# Patient Record
Sex: Female | Born: 1950 | Race: White | Hispanic: No | Marital: Married | Smoking: Former smoker
Health system: Southern US, Community
[De-identification: ages and names within clinical notes are randomized; demographics above are authoritative.]

## PROBLEM LIST (undated history)

## (undated) DIAGNOSIS — H269 Unspecified cataract: Secondary | ICD-10-CM

## (undated) DIAGNOSIS — I10 Essential (primary) hypertension: Secondary | ICD-10-CM

## (undated) DIAGNOSIS — G4730 Sleep apnea, unspecified: Secondary | ICD-10-CM

## (undated) DIAGNOSIS — K449 Diaphragmatic hernia without obstruction or gangrene: Secondary | ICD-10-CM

## (undated) DIAGNOSIS — E785 Hyperlipidemia, unspecified: Secondary | ICD-10-CM

## (undated) DIAGNOSIS — F40240 Claustrophobia: Secondary | ICD-10-CM

## (undated) DIAGNOSIS — M47817 Spondylosis without myelopathy or radiculopathy, lumbosacral region: Secondary | ICD-10-CM

## (undated) DIAGNOSIS — K219 Gastro-esophageal reflux disease without esophagitis: Secondary | ICD-10-CM

## (undated) DIAGNOSIS — M069 Rheumatoid arthritis, unspecified: Secondary | ICD-10-CM

## (undated) DIAGNOSIS — M545 Low back pain, unspecified: Secondary | ICD-10-CM

## (undated) HISTORY — DX: Essential (primary) hypertension: I10

## (undated) HISTORY — DX: Gastro-esophageal reflux disease without esophagitis: K21.9

## (undated) HISTORY — DX: Spondylosis without myelopathy or radiculopathy, lumbosacral region: M47.817

## (undated) HISTORY — PX: EYE SURGERY: SHX253

## (undated) HISTORY — DX: Rheumatoid arthritis, unspecified: M06.9

## (undated) HISTORY — DX: Diaphragmatic hernia without obstruction or gangrene: K44.9

## (undated) HISTORY — DX: Unspecified cataract: H26.9

## (undated) HISTORY — DX: Low back pain, unspecified: M54.50

## (undated) HISTORY — DX: Claustrophobia: F40.240

## (undated) HISTORY — DX: Hyperlipidemia, unspecified: E78.5

## (undated) HISTORY — DX: Sleep apnea, unspecified: G47.30

---

## 1991-11-06 DIAGNOSIS — K5090 Crohn's disease, unspecified, without complications: Secondary | ICD-10-CM

## 1991-11-06 HISTORY — DX: Crohn's disease, unspecified, without complications: K50.90

## 1996-10-12 ENCOUNTER — Ambulatory Visit: Admit: 1996-10-12 | Disposition: A | Discharge status: Home / Self Care | Payer: Self-pay | Admitting: Internal Medicine

## 1998-08-22 ENCOUNTER — Ambulatory Visit: Admit: 1998-08-22 | Disposition: A | Discharge status: Home / Self Care | Payer: Self-pay | Admitting: Internal Medicine

## 1998-11-08 ENCOUNTER — Other Ambulatory Visit: Payer: Self-pay

## 1998-11-08 ENCOUNTER — Ambulatory Visit: Admit: 1998-11-08 | Disposition: A | Discharge status: Home / Self Care | Payer: Self-pay | Admitting: Gastroenterology

## 1998-12-05 ENCOUNTER — Ambulatory Visit: Admit: 1998-12-05 | Disposition: A | Discharge status: Home / Self Care | Payer: Self-pay | Admitting: Gastroenterology

## 1998-12-19 ENCOUNTER — Ambulatory Visit: Admit: 1998-12-19 | Disposition: A | Discharge status: Home / Self Care | Payer: Self-pay | Admitting: Gastroenterology

## 2001-01-28 ENCOUNTER — Ambulatory Visit: Admit: 2001-01-28 | Disposition: A | Discharge status: Home / Self Care | Payer: Self-pay | Admitting: Gastroenterology

## 2001-01-28 ENCOUNTER — Other Ambulatory Visit: Payer: Self-pay

## 2003-08-26 ENCOUNTER — Ambulatory Visit
Admission: RE | Admit: 2003-08-26 | Disposition: A | Discharge status: Home / Self Care | Payer: Self-pay | Source: Ambulatory Visit | Admitting: Gastroenterology

## 2004-10-05 ENCOUNTER — Ambulatory Visit
Admission: RE | Admit: 2004-10-05 | Disposition: A | Discharge status: Home / Self Care | Payer: Self-pay | Source: Ambulatory Visit | Admitting: Gastroenterology

## 2004-12-08 ENCOUNTER — Ambulatory Visit
Admission: AD | Admit: 2004-12-08 | Disposition: A | Discharge status: Home / Self Care | Payer: Self-pay | Source: Ambulatory Visit | Admitting: Gastroenterology

## 2005-04-07 ENCOUNTER — Inpatient Hospital Stay
Admission: EM | Admit: 2005-04-07 | Disposition: A | Discharge status: Home / Self Care | Payer: Self-pay | Source: Emergency Department | Admitting: Critical Care Medicine

## 2005-05-09 ENCOUNTER — Ambulatory Visit
Admission: RE | Admit: 2005-05-09 | Disposition: A | Discharge status: Home / Self Care | Payer: Self-pay | Source: Ambulatory Visit | Admitting: Internal Medicine

## 2005-07-18 ENCOUNTER — Emergency Department
Admission: EM | Admit: 2005-07-18 | Disposition: A | Discharge status: Home / Self Care | Payer: Self-pay | Source: Emergency Department | Admitting: Emergency Medicine

## 2006-08-18 ENCOUNTER — Ambulatory Visit
Admission: RE | Admit: 2006-08-18 | Disposition: A | Discharge status: Home / Self Care | Payer: Self-pay | Source: Ambulatory Visit | Admitting: Gastroenterology

## 2006-09-02 ENCOUNTER — Ambulatory Visit
Admission: RE | Admit: 2006-09-02 | Disposition: A | Discharge status: Home / Self Care | Payer: Self-pay | Source: Ambulatory Visit | Admitting: Gastroenterology

## 2006-09-24 ENCOUNTER — Ambulatory Visit
Admission: RE | Admit: 2006-09-24 | Disposition: A | Discharge status: Home / Self Care | Payer: Self-pay | Source: Ambulatory Visit | Admitting: Internal Medicine

## 2006-10-07 ENCOUNTER — Ambulatory Visit
Admission: RE | Admit: 2006-10-07 | Disposition: A | Discharge status: Home / Self Care | Payer: Self-pay | Source: Ambulatory Visit | Admitting: Obstetrics & Gynecology

## 2006-10-14 ENCOUNTER — Ambulatory Visit
Admission: RE | Admit: 2006-10-14 | Disposition: A | Discharge status: Home / Self Care | Payer: Self-pay | Source: Ambulatory Visit | Admitting: Gastroenterology

## 2006-11-05 DIAGNOSIS — C4490 Unspecified malignant neoplasm of skin, unspecified: Secondary | ICD-10-CM

## 2006-11-05 HISTORY — DX: Unspecified malignant neoplasm of skin, unspecified: C44.90

## 2006-12-11 ENCOUNTER — Ambulatory Visit
Admission: RE | Admit: 2006-12-11 | Disposition: A | Discharge status: Home / Self Care | Payer: Self-pay | Source: Ambulatory Visit | Admitting: Gastroenterology

## 2007-02-03 ENCOUNTER — Ambulatory Visit
Admission: RE | Admit: 2007-02-03 | Disposition: A | Discharge status: Home / Self Care | Payer: Self-pay | Source: Ambulatory Visit | Admitting: Gastroenterology

## 2007-04-03 ENCOUNTER — Ambulatory Visit
Admission: RE | Admit: 2007-04-03 | Disposition: A | Discharge status: Home / Self Care | Payer: Self-pay | Source: Ambulatory Visit | Admitting: Gastroenterology

## 2007-05-23 ENCOUNTER — Ambulatory Visit
Admission: AD | Admit: 2007-05-23 | Disposition: A | Discharge status: Home / Self Care | Payer: Self-pay | Source: Ambulatory Visit | Admitting: Gastroenterology

## 2007-07-11 ENCOUNTER — Ambulatory Visit
Admission: RE | Admit: 2007-07-11 | Disposition: A | Discharge status: Home / Self Care | Payer: Self-pay | Source: Ambulatory Visit | Admitting: Gastroenterology

## 2007-08-29 ENCOUNTER — Ambulatory Visit
Admission: RE | Admit: 2007-08-29 | Disposition: A | Discharge status: Home / Self Care | Payer: Self-pay | Source: Ambulatory Visit | Admitting: Gastroenterology

## 2007-10-16 ENCOUNTER — Ambulatory Visit
Admission: RE | Admit: 2007-10-16 | Disposition: A | Discharge status: Home / Self Care | Payer: Self-pay | Source: Ambulatory Visit | Admitting: Gastroenterology

## 2007-12-05 ENCOUNTER — Ambulatory Visit
Admission: RE | Admit: 2007-12-05 | Disposition: A | Discharge status: Home / Self Care | Payer: Self-pay | Source: Ambulatory Visit | Admitting: Gastroenterology

## 2008-11-22 ENCOUNTER — Ambulatory Visit
Admission: RE | Admit: 2008-11-22 | Disposition: A | Discharge status: Home / Self Care | Payer: Self-pay | Source: Ambulatory Visit | Admitting: Internal Medicine

## 2009-10-04 ENCOUNTER — Ambulatory Visit
Admission: RE | Admit: 2009-10-04 | Disposition: A | Discharge status: Home / Self Care | Payer: Self-pay | Source: Ambulatory Visit | Admitting: Plastic Surgery

## 2010-05-03 ENCOUNTER — Ambulatory Visit
Admission: RE | Admit: 2010-05-03 | Disposition: A | Discharge status: Home / Self Care | Payer: Self-pay | Source: Ambulatory Visit | Admitting: Rheumatology

## 2010-05-04 ENCOUNTER — Ambulatory Visit
Admission: RE | Admit: 2010-05-04 | Disposition: A | Discharge status: Home / Self Care | Payer: Self-pay | Source: Ambulatory Visit | Admitting: Rheumatology

## 2010-05-09 ENCOUNTER — Ambulatory Visit
Admission: RE | Admit: 2010-05-09 | Disposition: A | Discharge status: Home / Self Care | Payer: Self-pay | Source: Ambulatory Visit | Admitting: Rheumatology

## 2010-08-28 ENCOUNTER — Ambulatory Visit
Admission: RE | Admit: 2010-08-28 | Disposition: A | Discharge status: Home / Self Care | Payer: Self-pay | Source: Ambulatory Visit | Admitting: Gastroenterology

## 2010-08-29 LAB — CREATININE, WHOLE BLOOD (RAD ONLY)
Creatinine, WB: 0.8 mg/dL (ref 0.52–1.04)
EGFR: >60
EGFR: >60

## 2010-09-01 ENCOUNTER — Ambulatory Visit
Admission: RE | Admit: 2010-09-01 | Disposition: A | Discharge status: Home / Self Care | Payer: Self-pay | Source: Ambulatory Visit | Admitting: Gastroenterology

## 2011-05-01 ENCOUNTER — Emergency Department
Admission: EM | Admit: 2011-05-01 | Disposition: A | Discharge status: Home / Self Care | Payer: Self-pay | Source: Emergency Department | Admitting: Emergency Medicine

## 2011-05-01 LAB — COMPREHENSIVE METABOLIC PANEL
ALT: 55 U/L (ref 7–56)
AST (SGOT): 57 U/L — ABNORMAL HIGH (ref 5–40)
Albumin, Synovial: 4 g/dL (ref 3.9–5.0)
Alkaline Phosphatase: 100 U/L (ref 38–126)
BUN / Creatinine Ratio: 26 — ABNORMAL HIGH (ref 8–20)
BUN: 21 mg/dL — ABNORMAL HIGH (ref 6–20)
Bilirubin, Total: 0.8 mg/dL (ref 0.2–1.3)
CO2: 24 mmol/L (ref 21.0–31.0)
Calcium: 9.7 mg/dL (ref 8.4–10.2)
Chloride: 102 mmol/L (ref 101–111)
Creatinine: 0.81 mg/dL (ref 0.52–1.04)
EGFR: >60 mL/min/{1.73_m2}
EGFR: >60 mL/min/{1.73_m2}
Glucose: 157 mg/dL — ABNORMAL HIGH (ref 70–100)
Potassium: 5 mmol/L (ref 3.6–5.0)
Protein, Total: 6.7 g/dL (ref 6.3–8.2)
Sodium: 139 mmol/L (ref 135–145)

## 2011-05-01 LAB — URINALYSIS
Bilirubin, UA: NEGATIVE
Glucose, UA: NEGATIVE
Ketones UA: NEGATIVE
Leukocyte Esterase, UA: NEGATIVE
Nitrate: NEGATIVE
Protein, UR: NEGATIVE
Specific Gravity, UR: 1.018 (ref 1.000–1.035)
Urobilinogen, UA: NORMAL
pH, Urine: 5 (ref 5.0–8.0)

## 2011-05-01 LAB — CBC AND DIFFERENTIAL
BASOPHILS %: 0.2 % (ref 0.0–2.0)
Baso(Absolute): 0.01 10*3/uL (ref 0.00–0.20)
Eosinophils %: 1.5 % (ref 0.0–6.0)
Eosinophils Absolute: 0.09 10*3/uL — ABNORMAL LOW (ref 0.10–0.30)
Hematocrit: 38.2 % (ref 27.0–49.5)
Hemoglobin: 12.6 g/dL (ref 11.7–15.5)
Lymphocytes Absolute: 0.95 10*3/uL — ABNORMAL LOW (ref 1.00–4.80)
Lymphocytes Relative: 15.7 % — ABNORMAL LOW (ref 25.0–55.0)
MCH: 32.6 pg (ref 27.0–34.0)
MCHC: 33 g/dL (ref 32.0–36.0)
MCV: 98.7 fL (ref 80–100)
MPV: 10.2 fL (ref 9.0–13.0)
Monocytes Absolute: 0.6 10*3/uL (ref 0.10–1.20)
Monocytes Relative %: 9.9 % — ABNORMAL HIGH (ref 1.0–8.0)
Neutrophils Absolute: 4.42 10*3/uL (ref 1.80–7.70)
Neutrophils Relative %: 72.7 % — ABNORMAL HIGH (ref 49.0–69.0)
Platelets: 247 10*3/uL (ref 150–400)
RBC: 3.87 M/uL (ref 3.80–5.40)
RDW: 13.2 % (ref 11.0–14.0)
WBC: 6.07 10*3/uL (ref 4.80–10.80)

## 2011-05-01 LAB — URINALYSIS WITH MICROSCOPIC

## 2011-05-01 LAB — LI-FRAUMENI SYNDROME KNOWN FAMILIAL MUTATION: Lipase: 166 U/L (ref 23–300)

## 2011-08-08 NOTE — Op Note (Signed)
(  NAME)             Patricia [name], Patricia [name]  (ADMIT DATE)       10/07/2006  (SURGERY DATE)     10/07/2006  (MR NUMBER)        [id number]  (ACCT NUMBER)      [id number]  (ROOM NUMBER)      ASD  (SURGEON)          Thane Edu, [name].D.    [facility]  REPORT OF OPERATION      PREOPERATIVE DIAGNOSIS  Postmenopausal bleeding.    POSTOPERATIVE DIAGNOSIS  Postmenopausal bleeding.    OPERATION  Diagnostic hysteroscopy.  Fractional D C.    ANESTHESIA  IV sedation with paracervical block.    ESTIMATED BLOOD LOSS  5 cc.    COMPLICATIONS  None.    FINDINGS  Normal cervix, uterus sounded to 8 cm, no lesions noted.  Bilateral tubal  ostia noted.    CLINICAL NOTE  Patient is a 60 year old G38P2, 0-2-2 with a history of postmenopausal  bleeding. She underwent an endometrial biopsy in September of 2006 which  was benign.  However, her bleeding has persisted.  The recommendation was  to proceed with the hysteroscopy D C.  Risks of the procedure were  discussed with the patient including infection, bleeding, uterine  perforation, possible damage to the bowel and bladder, possible persistent  bleeding.  Patient understood these risks and wanted to proceed.    OPERATIVE NOTE  The patient was taken to the operating room and IV sedation was given. She  was placed in the dorsal lithotomy position and the bladder was emptied.  The patient was prepped and draped in the usual sterile fashion.  The  cervix was visualized and a single tooth tenaculum was placed anteriorly.  A paracervical block was placed.  Then endocervical curettage was carried  out.  Using [facility] dilators the cervix was dilated and the diagnostic  hysteroscope was introduced.  The above findings were noted.  Pictures were  taken, then the hysteroscope was removed and an endometrial curettage was  carried out with a small amount of tissue retrieved.  The hysteroscope was  introduced once more and with no lesions noted the procedure was ended.  Authenticated by Thane Edu, MD On  10/14/2006 12:14:47 PM   The patient was awakened and taken to recovery in stable condition.      _______________________________________  Thane Edu, [name].D.  LP/sk  D:  10/08/2006  1:19 P  T:  10/08/2006  1:39 P  Job #:  [id number]  Doc #:  [id number]  cc:   Thane Edu, [name].D.

## 2011-08-24 NOTE — Op Note (Unsigned)
Account Number: [id number]      Document ID: [id number]      Admit Date: 10/04/2009      Procedure Date: 10/04/2009            Patient Location: K523-01      Patient Type: I            SURGEON: Verneita Griffes MD      ASSISTANT:                  PREOPERATIVE DIAGNOSIS:      Abdominal dermolipodystrophy.            POSTOPERATIVE DIAGNOSIS:      Abdominal dermolipodystrophy.            TITLE OF PROCEDURE:      1.  Abdominoplasty with liposuction.      2.  Placement of On-Q pain pump to abdominal area.            ANESTHESIA:      General.            BLOOD LOSS:      Minimal.            COMPLICATIONS:      None.            DRAINS:      One Jackson-Pratt drain to the abdomen.            INDICATIONS:      The patient is a 60 year old young woman in good general health, although a      history of hypertension and central adiposity.  Her desire is to      specifically remove the redundant skin of the lower abdomen through a tummy      tuck procedure.  Specifically, though I have discussed with her the fact      that she does have significant intraabdominal contents that are      contributing to the protrusion of the abdomen and that even with tightening      of the abdominal wall she will still have that contour.  The risks that we      have talked about related to surgery include bleeding, infection, tissue      necrosis, umbilical necrosis, unsatisfactory results, scarring, need for      further surgeries, etc.  Other potential risks also included DVT or      pulmonary embolus, and I have discussed that with her in detail because we      would like her ambulatory, active postoperatively to reduce these risks      given her body type and the procedure.  We also are limiting the length of      the procedure and focusing specifically on the abdomen alone.  She      understands that.  I believe she has a realistic expectation and again,      specifically would like the reduction of the skin in the old lower abdomen,      understanding  that she will not have a flat abdomen.            TITLE OF PROCEDURE AND FINDINGS:      The patient was marked preoperatively, taken to the operating room and      after induction of general anesthesia, having already placed the SCDs and      activating them.  A Foley catheter was also placed and 0.5% lidocaine and      epinephrine was  used to infiltrate around the umbilicus and the planned      incision sites.  The entire abdomen and pelvic regions were prepped and      draped in a sterile fashion.            A periumbilical incision was made, skeletonizing the umbilicus then the      remaining incision across the lower abdomen.  This was extended down to the      anterior fascia.  The soft tissue flap was elevated off the anterior      fascia, dissecting up to the level of the umbilicus, around the umbilicus      and then as dissection extended cephalad it was clear that the redundant      skin could be safely excised.  So this was excised and hypogastric soft      tissue delivered off the operative field.            Dissection then continued centrally up to the level of xiphoid process,      attempting to maintain as much attachments laterally, minimizing the      undermining so that we may preserve the perforator vessels.  The umbilicus      was inspected.  There was noted to be a hernia within the umbilical stalk      so this was opened.  The preperitoneal fat that was in this hernia sac was      excised.  The fascia here was approximated to repair the umbilical hernia      using interrupted 0 Ethibond.            The fascia was then marked from the level of xiphoid to the umbilicus and      down to the mons pubis and then fascial plication was performed with      interrupted 0 Ethibond and then a running 0 Ethibond.  This helped to      improve the contour, although still as expected and as evidenced on exam      intraabdominal contents were contributing to the protuberance.  There was      good hemostasis.             Through the upper abdominal flap on either side, the introducer needle was      used to place 2 On-Q pain pump catheters, placing them over the anterior      fascia, and then from the mons pubic region a JP drain was placed in the      abdominal wound.  This was secured with 2-0 silk.            She was then flexed in the bed and the upper abdominal flap advanced      inferiorly.  The umbilicus was marked anteriorly and the fat thinned behind      it.  Initially, then the Scarpa fascia was approximated and small dog ears      bilaterally were excised at the level of the hips.  After approximating      Scarpa fascia with 0 Ethibond and this was tacked to the anterior fascia in      several spots centrally then interrupted deep Vicryl sutures were placed in      the deep dermis.            Tumescent fluid of lactated Ringer's with 30 mL of plain 1% lidocaine and 1      mL of 1:1000  epinephrine per liter was used to infiltrate 1400 mL into the      epigastric region extending bilaterally and over the mons pubis.  Remaining      sutures were placed as well as having made a small incision for the      location of the umbilicus and after adequate time for vasoconstrictive      effect, ultrasonic energy was applied for 6 minutes 15 seconds, aspirating      300 mL and then another 800 mL with standard liposuction techniques for a      total of 1100 mL aspirated from the epigastric central region extending      toward the flanks and over the mons pubis.  This gave good contour change.                  Now, the remaining umbilicus was approximated with deep Vicryl suture and a      running V-lock suture and then across the lower abdomen a running V-lock      suture.  Two segments were used for closure.  Skin quality including the      umbilicus looked good.  Mastisol and Steri-Strips were applied.  The On-Q      pain pump was attached to the 2 catheters.  Light dressings were applied.      She was maintained in hip  flex position and taken to the recovery room      awake, alert, in stable condition.                              _______________________________     Date/Time Signed: _____________      Verneita Griffes MD [phone])            D:  10/04/2009 10:27 AM by Verneita Griffes, MD [phone])      T:  10/04/2009 13:10 PM by [id number]          Everlean Cherry: [id number]) (Doc ID: [id number])                  QQ:[id number] Kc Sedlak MD

## 2011-10-24 ENCOUNTER — Emergency Department
Admission: EM | Admit: 2011-10-24 | Disposition: A | Discharge status: Home / Self Care | Payer: Self-pay | Source: Emergency Department | Admitting: Emergency Medicine

## 2011-10-24 LAB — URINALYSIS
Bilirubin, UA: NEGATIVE
Glucose, UA: NEGATIVE
Ketones UA: NEGATIVE
Nitrate: NEGATIVE
Protein, UR: NEGATIVE
Specific Gravity, UR: 1.018 (ref 1.000–1.035)
Urobilinogen, UA: NORMAL
pH, Urine: 6.5 (ref 5.0–8.0)

## 2011-10-24 LAB — COMPREHENSIVE METABOLIC PANEL
ALT: 58 U/L — ABNORMAL HIGH (ref 7–56)
AST (SGOT): 40 U/L (ref 5–40)
Albumin: 4 g/dL (ref 3.9–5.0)
Alkaline Phosphatase: 144 U/L — ABNORMAL HIGH (ref 38–126)
BUN / Creatinine Ratio: 34 — ABNORMAL HIGH (ref 8–20)
BUN: 24 mg/dL — ABNORMAL HIGH (ref 6–20)
Bilirubin, Total: 0.7 mg/dL (ref 0.2–1.3)
CO2: 27 mmol/L (ref 21.0–31.0)
Calcium: 9.4 mg/dL (ref 8.4–10.2)
Chloride: 97 mmol/L — ABNORMAL LOW (ref 101–111)
Creatinine: 0.72 mg/dL (ref 0.52–1.04)
EGFR: >60 mL/min/{1.73_m2}
EGFR: >60 mL/min/{1.73_m2}
Glucose: 178 mg/dL — ABNORMAL HIGH (ref 70–100)
Potassium: 3.9 mmol/L (ref 3.6–5.0)
Protein, Total: 6.7 g/dL (ref 6.3–8.2)
Sodium: 139 mmol/L (ref 135–145)

## 2011-10-24 LAB — CBC AND DIFFERENTIAL
BASOPHILS %: 0.1 % (ref 0.0–2.0)
Baso(Absolute): 0.01 10*3/uL (ref 0.00–0.20)
Eosinophils %: 0.7 % (ref 0.0–6.0)
Eosinophils Absolute: 0.05 10*3/uL — ABNORMAL LOW (ref 0.10–0.30)
Hematocrit: 40.4 % (ref 27.0–49.5)
Hemoglobin: 13.5 g/dL (ref 11.7–15.5)
Lymphocytes Absolute: 1.2 10*3/uL (ref 1.00–4.80)
Lymphocytes Relative: 17.6 % — ABNORMAL LOW (ref 25.0–55.0)
MCH: 32.2 pg (ref 27.0–34.0)
MCHC: 33.4 g/dL (ref 32.0–36.0)
MCV: 96.4 fL (ref 80–100)
MPV: 9.4 fL (ref 9.0–13.0)
Monocytes Absolute: 0.62 10*3/uL (ref 0.10–1.20)
Monocytes Relative %: 9.1 % — ABNORMAL HIGH (ref 1.0–8.0)
Neutrophils Absolute: 4.95 10*3/uL (ref 1.80–7.70)
Neutrophils Relative %: 72.5 % — ABNORMAL HIGH (ref 49.0–69.0)
Platelets: 227 10*3/uL (ref 150–400)
RBC: 4.19 M/uL (ref 3.80–5.40)
RDW: 13 % (ref 11.0–14.0)
WBC: 6.83 10*3/uL (ref 4.80–10.80)

## 2011-10-24 LAB — URINALYSIS WITH MICROSCOPIC

## 2011-11-06 DIAGNOSIS — N200 Calculus of kidney: Secondary | ICD-10-CM

## 2011-11-06 HISTORY — DX: Calculus of kidney: N20.0

## 2011-12-12 LAB — URINALYSIS
Bilirubin, UA: NEGATIVE
Glucose, UA: NEGATIVE
Ketones UA: NEGATIVE
Nitrate: NEGATIVE
Protein, UR: NEGATIVE
Specific Gravity, UR: 1.02 (ref 1.000–1.035)
Urobilinogen, UA: NORMAL
pH, Urine: 6 (ref 5.0–8.0)

## 2011-12-12 LAB — CBC AND DIFFERENTIAL
BASOPHILS %: 0.2 %
Baso(Absolute): 0.02 10*3/uL (ref 0.00–0.20)
Eosinophils %: 0.1 %
Eosinophils Absolute: 0.01 10*3/uL — ABNORMAL LOW (ref 0.10–0.30)
Hematocrit: 40.6 % (ref 27.0–49.5)
Hemoglobin: 13.6 g/dL (ref 11.7–15.5)
Lymphocytes Absolute: 1.59 10*3/uL (ref 1.00–4.80)
Lymphocytes Relative: 17.5 %
MCH: 32.2 pg (ref 27.0–34.0)
MCHC: 33.5 g/dL (ref 32.0–36.0)
MCV: 96 fL (ref 80–100)
MPV: 9.3 fL (ref 9.0–13.0)
Monocytes Absolute: 0.64 10*3/uL (ref 0.10–1.20)
Monocytes Relative %: 7.1 %
Neutrophils Absolute: 6.81 10*3/uL (ref 1.80–7.70)
Neutrophils Relative %: 75.1 %
Platelets: 264 10*3/uL (ref 150–400)
RBC: 4.23 M/uL (ref 3.80–5.40)
RDW: 13.6 % (ref 11.0–14.0)
WBC: 9.07 10*3/uL (ref 4.80–10.80)

## 2011-12-12 LAB — COMPREHENSIVE METABOLIC PANEL
ALT: 61 U/L — ABNORMAL HIGH (ref 7–56)
AST (SGOT): 32 U/L (ref 5–40)
Albumin: 4.3 g/dL (ref 3.9–5.0)
Alkaline Phosphatase: 123 U/L (ref 38–126)
BUN / Creatinine Ratio: 33 — ABNORMAL HIGH (ref 8–20)
BUN: 25 mg/dL — ABNORMAL HIGH (ref 6–20)
Bilirubin, Total: 0.5 mg/dL (ref 0.2–1.3)
CO2: 24 mmol/L (ref 21.0–31.0)
Calcium: 9.6 mg/dL (ref 8.4–10.2)
Chloride: 100 mmol/L — ABNORMAL LOW (ref 101–111)
Creatinine: 0.75 mg/dL (ref 0.52–1.04)
EGFR: >60 mL/min/{1.73_m2}
EGFR: >60 mL/min/{1.73_m2}
Glucose: 157 mg/dL — ABNORMAL HIGH (ref 70–100)
Potassium: 4.1 mmol/L (ref 3.6–5.0)
Protein, Total: 7.2 g/dL (ref 6.3–8.2)
Sodium: 137 mmol/L (ref 135–145)

## 2011-12-12 LAB — URINALYSIS WITH MICROSCOPIC

## 2011-12-13 ENCOUNTER — Observation Stay
Admission: EM | Admit: 2011-12-13 | Disposition: A | Discharge status: Home / Self Care | Payer: Self-pay | Source: Emergency Department | Admitting: Internal Medicine

## 2011-12-13 LAB — CBC AND DIFFERENTIAL
BASOPHILS %: 0.3 %
Baso(Absolute): 0.02 10*3/uL (ref 0.00–0.20)
Eosinophils %: 0.1 %
Eosinophils Absolute: 0.01 10*3/uL — ABNORMAL LOW (ref 0.10–0.30)
Hematocrit: 34.6 % (ref 27.0–49.5)
Hemoglobin: 11.4 g/dL — ABNORMAL LOW (ref 11.7–15.5)
Immature Granulocytes #: 0.01 10*3/uL (ref 0.00–0.05)
Immature Granulocytes %: 0.1 %
Lymphocytes Absolute: 1.26 10*3/uL (ref 1.00–4.80)
Lymphocytes Relative: 18.3 %
MCH: 31.6 pg (ref 27.0–34.0)
MCHC: 32.9 g/dL (ref 32.0–36.0)
MCV: 95.8 fL (ref 80–100)
MPV: 9.4 fL (ref 9.0–13.0)
Monocytes Absolute: 0.54 10*3/uL (ref 0.10–1.20)
Monocytes Relative %: 7.8 %
Neutrophils Absolute: 5.05 10*3/uL (ref 1.80–7.70)
Neutrophils Relative %: 73.5 %
Nucleated RBC %: 0 /100WBC
Nucleted RBC #: 0 10*3/uL (ref 0.00–0.00)
Platelets: 189 10*3/uL (ref 150–400)
RBC: 3.61 M/uL — ABNORMAL LOW (ref 3.80–5.40)
RDW: 14 % (ref 11.0–14.0)
WBC: 6.88 10*3/uL (ref 4.80–10.80)

## 2011-12-13 LAB — COMPREHENSIVE METABOLIC PANEL
ALT: 48 U/L (ref 7–56)
AST (SGOT): 22 U/L (ref 5–40)
Albumin: 3.1 g/dL — ABNORMAL LOW (ref 3.9–5.0)
Alkaline Phosphatase: 82 U/L (ref 38–126)
BUN / Creatinine Ratio: 40 — ABNORMAL HIGH (ref 8–20)
BUN: 30 mg/dL — ABNORMAL HIGH (ref 6–20)
Bilirubin, Total: 0.3 mg/dL (ref 0.2–1.3)
CO2: 25 mmol/L (ref 21.0–31.0)
Calcium: 8.4 mg/dL (ref 8.4–10.2)
Chloride: 104 mmol/L (ref 101–111)
Creatinine: 0.76 mg/dL (ref 0.52–1.04)
EGFR: >60 mL/min/{1.73_m2}
EGFR: >60 mL/min/{1.73_m2}
Glucose: 141 mg/dL — ABNORMAL HIGH (ref 70–100)
Potassium: 4.3 mmol/L (ref 3.6–5.0)
Protein, Total: 5.3 g/dL — ABNORMAL LOW (ref 6.3–8.2)
Sodium: 142 mmol/L (ref 135–145)

## 2011-12-13 LAB — MAGNESIUM: Magnesium: 2 mg/dL (ref 1.7–2.2)

## 2011-12-13 LAB — PT (PROTHROMBIN TIME)
PT INR: 0.9
PT: 12.6 s (ref 12.6–15.0)

## 2011-12-13 LAB — PTT (PARTIAL THROMBOPLASTIN TIME): PTT: 25.2 s (ref 23.0–37.0)

## 2011-12-14 LAB — CBC AND DIFFERENTIAL
BASOPHILS %: 0.3 %
Baso(Absolute): 0.01 10*3/uL (ref 0.00–0.20)
Eosinophils %: 1 %
Eosinophils Absolute: 0.04 10*3/uL — ABNORMAL LOW (ref 0.10–0.30)
Hematocrit: 34.9 % (ref 27.0–49.5)
Hemoglobin: 11.1 g/dL — ABNORMAL LOW (ref 11.7–15.5)
Immature Granulocytes #: 0.01 10*3/uL (ref 0.00–0.05)
Immature Granulocytes %: 0.3 %
Lymphocytes Absolute: 1.29 10*3/uL (ref 1.00–4.80)
Lymphocytes Relative: 32.7 %
MCH: 31.4 pg (ref 27.0–34.0)
MCHC: 31.8 g/dL — ABNORMAL LOW (ref 32.0–36.0)
MCV: 98.9 fL (ref 80–100)
MPV: 9.5 fL (ref 9.0–13.0)
Monocytes Absolute: 0.36 10*3/uL (ref 0.10–1.20)
Monocytes Relative %: 9.1 %
Neutrophils Absolute: 2.24 10*3/uL (ref 1.80–7.70)
Neutrophils Relative %: 56.9 %
Nucleated RBC %: 0 /100WBC
Nucleted RBC #: 0 10*3/uL (ref 0.00–0.00)
Platelets: 183 10*3/uL (ref 150–400)
RBC: 3.53 M/uL — ABNORMAL LOW (ref 3.80–5.40)
RDW: 14.5 % — ABNORMAL HIGH (ref 11.0–14.0)
WBC: 3.94 10*3/uL — ABNORMAL LOW (ref 4.80–10.80)

## 2011-12-14 LAB — BASIC METABOLIC PANEL
BUN / Creatinine Ratio: 28 — ABNORMAL HIGH (ref 8–20)
BUN: 17 mg/dL (ref 6–20)
CO2: 29 mmol/L (ref 21.0–31.0)
Calcium: 8.5 mg/dL (ref 8.4–10.2)
Chloride: 108 mmol/L (ref 101–111)
Creatinine: 0.59 mg/dL (ref 0.52–1.04)
EGFR: >60 mL/min/{1.73_m2}
EGFR: >60 mL/min/{1.73_m2}
Glucose: 109 mg/dL — ABNORMAL HIGH (ref 70–100)
Potassium: 4.1 mmol/L (ref 3.6–5.0)
Sodium: 147 mmol/L — ABNORMAL HIGH (ref 135–145)

## 2011-12-14 LAB — PHOSPHORUS: Phosphorus: 4.5 mg/dL — ABNORMAL HIGH (ref 2.4–4.4)

## 2011-12-14 LAB — MAGNESIUM: Magnesium: 2 mg/dL (ref 1.7–2.2)

## 2011-12-15 NOTE — Op Note (Signed)
Patricia [name], Patricia [name]                                                    MRN:          [id number]                                                          Account:      [id number]                                                     Document ID:  [id number]                                               Procedure Date: 12/14/2011                                                                                    MRN: [id number]  Document ID: [id number]  Admit Date: 12/13/2011     Patient Location: [id number]   Patient Type: O     SURGEON: Etheleen Mayhew MD  ASSISTANT:          PREOPERATIVE DIAGNOSES:  Right ureteral calculi, right renal colic.     POSTOPERATIVE DIAGNOSES:  Right ureteral calculi, right renal colic.     TITLE OF PROCEDURE:  Cystoscopy, ureteroscopy, stent placement with dangler.     ANESTHESIA:  General.     ESTIMATED BLOOD LOSS:  Minimal.     DRAINS:  A 26-cm 6-French double-J stent to the right collecting system.     COMPLICATIONS:  None.     INDICATIONS FOR PROCEDURE:  This woman has had a 2 month history of right renal colic and a small  distal ureteral stone.  She has failed a trial of labor and has been  admitted through the emergency room for IV pain control, and she is here  today for stone removal.     DESCRIPTION OF PROCEDURE:  The patient was taken to the operating room and uncomplicated anesthesia  was induced.  She was placed in the relaxed dorsal lithotomy position and  genitalia were sterilely prepped and draped.  Fluoroscopy failed to  identify the stone clearly.  Cystourethroscopy was performed with a  well-lubricated 22-French scope, 30 and 70-degree lens.  The urethra was  normal.  The bladder was entered.  Thorough inspection revealed no mucosal  Page 1 of 2  Patricia [name], Patricia [name]                                                    MRN:          [id number]                                                           Account:      [id number]                                                     Document ID:  [id number]                                               Procedure Date: 12/14/2011                                                                                    lesions.  The ureteral orifices were in normal position bilaterally.  There  was no evidence of stone in the bladder.  Using a well-lubricated  Glidewire, the right ureteral orifice was intubated and standard intramural  balloon dilation was accomplished.  I was able then to perform semi-rigid  ureteroscopy to the renal pelvis.  There was evidence of crushed stones at  the distal ureter, and no other fragments or stones identified through the  extent of the length of the ureter.  Because of the edema and the  chronicity, a decision was made to leave a stent in place.  Wire was  backloaded onto the cystoscope, and the stent as indicated above positioned  with a dangler.  The patient tolerated the procedure quite nicely and was  taken to the recovery room in stable condition.              D:  12/14/2011 10:55 AM by Etheleen Mayhew, MD ([id number])  T:  12/14/2011 11:43 AM by [id number]      Everlean Cherry: [id number]) (Doc ID: [id number])        cc: Barnetta Chapel MD  Page 2 of 2  Authenticated by Vladimir Creeks, MD On 12/15/2011 08:08:09 AM

## 2011-12-17 NOTE — H&P (Signed)
RYN, PEINE                                                    MRN:          [id number]                                                          Account:      [id number]                                                     Document ID:  [id number]                                                                                                                                    MRN: [id number]  Document ID: [id number]  Admit Date: 12/13/2011     Patient Location: [id number]   Patient Type: O     ATTENDING PHYSICIAN: Dorthula Nettles, MD        CHIEF COMPLAINT:  Right flank pain.     HISTORY OF PRESENT ILLNESS:  Patient is a 61 year old female with a past medical history of  hypertension, IBD (Crohn disease), osteoarthritis, rheumatoid arthritis,  history of nephrolithiasis in the past, here with complaints of right flank  pain that started yesterday.  The right flank pain is described as sharp,  stabbing, felt radiating down to the mid lower quadrant of the abdomen.   Patient also developed some chills as well as dysuria as well as frequency.   Patient currently denies any headache or dizziness.  Denies any chest pain  or shortness of breath.  Denies any fever, but positive for chills.  Denies  any coughing.  Denies any sore throat.  Denies any numbness or weakness in  bilateral upper or lower extremities.  Denies any nausea or vomiting.   Positive for diarrhea, but negative for blood in stool.  Patient is  supposed to travel to the [place] this coming Friday.  She has  had right flank pain approximately 2 months ago, and it resolved on its  own, and she felt that kidney stone had resolved on its own.  Patient has  had a kidney stone as well approximately a year ago and that resolved on  its own.  She has never had any procedure done for her kidney stones.     PAST MEDICAL HISTORY:  Hypertension, nephrolithiasis, IBD (  Crohn disease), osteoarthritis as well  as rheumatoid arthritis.     PAST SURGICAL  HISTORY:  Includes a tummy tuck.     MEDICATIONS:  Include Coreg 12.5 mg p.o. b.i.d., Klonopin 1 mg p.o. nightly, Lyrica 75 mg  p.o. b.i.d., hydrochlorothiazide 25 mg p.o. daily, Nucynta ER 150 mg p.o.  b.i.d., Cymbalta 60 mg p.o. b.i.d., Prevacid 30 mg p.o. daily, benazepril  20 mg p.o. daily, Klor-Con 20 mEq p.o. daily, Humira 80 mg injection subQ  every 2 weeks, Ambien 10 mg p.o. nightly.                                                                                                              Page 1 of 3  Patricia [name], Patricia [name]                                                    MRN:          [id number]                                                          Account:      [id number]                                                     Document ID:  [id number]                                                                                                                                    ALLERGIES:  ASACOL, AZATHIOPRINE, MERCAPTOPURINE and SULFA.     SOCIAL HISTORY:  Denies any tobacco abuse, alcohol abuse, or illicit drug use.     FAMILY HISTORY:  Negative for any history of early CAD, CVA, or VTE in the family.  Mother  had recurrent chronic UTI as well as father who had esophageal cancer.     REVIEW OF SYSTEMS:  See history of present illness for further details.  PHYSICAL EXAMINATION:  GENERAL:  Patient is awake, alert, oriented x3.  VITAL SIGNS:  Blood pressure is currently 166/80, pulse 102, respiration  18, temperature of 97.2, saturating at 100% on room air.  HEENT:  Normocephalic, atraumatic.  Pupils were equal, round, reactive to  light.  EOM was intact.  Conjunctivae were not anemic.  Sclerae was  nonicteric.  NECK:  Negative JVD.  Negative carotid bruit.  Negative thyromegaly.  CARDIOVASCULAR:  S1, S2.  Regular rate and rhythm.  Negative murmur.   Negative gallop.  PULMONARY:  Lungs were clear to auscultation.  Negative wheezing.  Negative  rales.  ABDOMEN:  Positive bowel sounds.  Slight tenderness  in the mid lower  quadrant area.  Negative for guarding.  Negative for rebound.  Negative  distention.  Negative hepatosplenomegaly.  Positive for CVA tenderness on  the right side.  EXTREMITIES:  Trace pitting edema, bilateral lower extremities.  Pulses  were palpable DP and PT bilaterally.  NEUROLOGIC:  Cranial nerves II through XII were grossly intact.  There were  no gross motoric or sensory deficits bilateral upper or lower extremities.     DIAGNOSTIC STUDIES:  CBC shows white blood cell 9.0, hemoglobin 13.6, platelets 264 with a CMP  showing BUN 25, sodium 137, potassium 4.1, chloride 100, CO2 of 24, glucose  157, creatinine 0.7, calcium 9.6.  Total bilirubin 0.5, alkaline  phosphatase 123, AST 32, ALT 61, GFR is more than 60.  Urinalysis shows +1  leukocyte esterase with +1 blood.  Microscopy of the urine shows bacteria  to be moderate, RBC 5 to 10 and WBC 10 to 25.  CT scan of the abdomen and  pelvis shows a 2 mm obstructive distal ureteral stone at level of the right  vesicoureteral junction causing mild right-sided hydronephrosis and  hydroureter.  It also shows bilateral nephrolithiasis and hepatic steatosis  as well as a 1.5 cm right ovarian cyst.                                                                                                           Page 2 of 3  Patricia [name], Patricia [name]                                                    MRN:          [id number]                                                          Account:      [id number]  Document ID:  [id number]                                                                                                                                       ASSESSMENT AND PLAN:  1.  Obstructive uropathy secondary to nephrolithiasis.  Plan is to keep  patient n.p.o.  Start patient on intravenous fluid with  normal saline as  well as obtaining urology consult in the morning.  2.  Urinary tract infection.  I am  starting patient on antibiotics with  Rocephin as well as obtaining urine culture as well with sensitivity.  3.  Hypertension.  I am continuing her hydrochlorothiazide, Coreg and  benazepril.  4.  Anxiety.  I am continuing Klonopin.  5.  Rheumatoid arthritis.  She is currently on Humira every 2 weeks.  The  last one she got was Wednesday last week.  6.  Osteoarthritis, currently stable.  7.  Inflammatory bowel disease (Crohn's).  Plan is patient is currently on  Humira every 2 weeks.  7.  Deep venous thrombosis prophylaxis.  I am starting patient on  sequential compression devices, bilateral lower extremities.              D:  12/13/2011 03:11 AM by Dorthula Nettles, MD ([id number])  T:  12/13/2011 03:53 AM by [id number]      Everlean Cherry: [id number]) (Doc ID: [id number])        cc:                                                                                                                Page 3 of 3  Authenticated and Edited by Dorthula Nettles, MD On 12/17/11 2:37:39 AM

## 2011-12-24 ENCOUNTER — Ambulatory Visit
Admission: RE | Admit: 2011-12-24 | Disposition: A | Discharge status: Home / Self Care | Payer: Self-pay | Source: Ambulatory Visit | Attending: Internal Medicine | Admitting: Internal Medicine

## 2012-01-01 ENCOUNTER — Emergency Department
Admission: EM | Admit: 2012-01-01 | Disposition: A | Discharge status: Home / Self Care | Payer: Self-pay | Source: Emergency Department | Admitting: Emergency Medicine

## 2012-01-01 LAB — MORPH REVIEW
Cell Morphology:: NORMAL
Platelet Evaluation: NORMAL

## 2012-01-01 LAB — COMPREHENSIVE METABOLIC PANEL
ALT: 63 U/L — ABNORMAL HIGH (ref 7–56)
AST (SGOT): 34 U/L (ref 5–40)
Albumin: 3.5 g/dL — ABNORMAL LOW (ref 3.9–5.0)
Alkaline Phosphatase: 143 U/L — ABNORMAL HIGH (ref 38–126)
BUN / Creatinine Ratio: 19 (ref 8–20)
BUN: 9 mg/dL (ref 6–20)
Bilirubin, Total: 0.4 mg/dL (ref 0.2–1.3)
CO2: 28 mmol/L (ref 21.0–31.0)
Calcium: 9.1 mg/dL (ref 8.4–10.2)
Chloride: 100 mmol/L — ABNORMAL LOW (ref 101–111)
Creatinine: 0.47 mg/dL — ABNORMAL LOW (ref 0.52–1.04)
EGFR: >60 mL/min/{1.73_m2}
EGFR: >60 mL/min/{1.73_m2}
Glucose: 168 mg/dL — ABNORMAL HIGH (ref 70–100)
Potassium: 3.9 mmol/L (ref 3.6–5.0)
Protein, Total: 6.2 g/dL — ABNORMAL LOW (ref 6.3–8.2)
Sodium: 140 mmol/L (ref 135–145)

## 2012-01-01 LAB — CBC AND DIFFERENTIAL
BASOPHILS %: 0.8 %
Baso(Absolute): 0.02 10*3/uL (ref 0.00–0.20)
Eosinophils %: 1.6 %
Eosinophils Absolute: 0.04 10*3/uL — ABNORMAL LOW (ref 0.10–0.30)
Hematocrit: 39.2 % (ref 27.0–49.5)
Hemoglobin: 13 g/dL (ref 11.7–15.5)
Lymphocytes Absolute: 0.75 10*3/uL — ABNORMAL LOW (ref 1.00–4.80)
Lymphocytes Relative: 29.2 %
MCH: 31.7 pg (ref 27.0–34.0)
MCHC: 33.2 g/dL (ref 32.0–36.0)
MCV: 95.6 fL (ref 80–100)
MPV: 9.3 fL (ref 9.0–13.0)
Monocytes Absolute: 0.35 10*3/uL (ref 0.10–1.20)
Monocytes Relative %: 13.6 %
Neutrophils Absolute: 1.41 10*3/uL — ABNORMAL LOW (ref 1.80–7.70)
Neutrophils Relative %: 54.8 %
Platelets: 174 10*3/uL (ref 150–400)
RBC: 4.1 M/uL (ref 3.80–5.40)
RDW: 13.3 % (ref 11.0–14.0)
WBC: 2.57 10*3/uL — ABNORMAL LOW (ref 4.80–10.80)

## 2012-04-03 NOTE — Anesthesia Preprocedure Evaluation (Addendum)
Anesthesia Evaluation    AIRWAY           CARDIOVASCULAR    cardiovascular exam normal     DENTAL         PULMONARY    pulmonary exam normal     OTHER FINDINGS          ED note (01/01/2012) reviewed [CH]: PMH reviewed       61 yo F with HTN, RA, Crohn's disease, anxiety, chronic pain        Anesthesia Plan    ASA 2   general         Post op pain management: per surgeon  intravenous induction   informed consent obtained    Plan discussed with CRNA.         Labs    Lab Results   Component Value Date    WBC 2.57* 01/01/2012    HGB 13.0 01/01/2012    HCT 39.2 01/01/2012    PLT 174 01/01/2012    ALT 63* 01/01/2012    AST 34 01/01/2012    NA 140 01/01/2012    K 3.9 01/01/2012    CL 100* 01/01/2012    CO2 28 01/01/2012    CREAT 0.47* 01/01/2012    BUN 9 01/01/2012    PT 12.6 12/13/2011    PTT 25.2 12/13/2011    INR 0.9 12/13/2011    GLU 168* 01/01/2012    MG 2.0 12/14/2011       PC clearance on chart

## 2012-04-04 ENCOUNTER — Ambulatory Visit: Payer: Commercial Managed Care - HMO

## 2012-04-04 DIAGNOSIS — I10 Essential (primary) hypertension: Secondary | ICD-10-CM | POA: Insufficient documentation

## 2012-04-04 DIAGNOSIS — G4730 Sleep apnea, unspecified: Secondary | ICD-10-CM | POA: Insufficient documentation

## 2012-04-04 DIAGNOSIS — K219 Gastro-esophageal reflux disease without esophagitis: Secondary | ICD-10-CM | POA: Insufficient documentation

## 2012-04-04 DIAGNOSIS — M069 Rheumatoid arthritis, unspecified: Secondary | ICD-10-CM | POA: Insufficient documentation

## 2012-04-04 DIAGNOSIS — K5090 Crohn's disease, unspecified, without complications: Secondary | ICD-10-CM | POA: Insufficient documentation

## 2012-04-04 DIAGNOSIS — K449 Diaphragmatic hernia without obstruction or gangrene: Secondary | ICD-10-CM | POA: Insufficient documentation

## 2012-04-04 NOTE — Pre-Procedure Instructions (Signed)
`   PATIENT WILL TAKE HCT,COREG, BENAZEPRIL, LYRICA AND NUCYNTA THE AM OF SURGERY

## 2012-04-08 ENCOUNTER — Encounter
Admission: RE | Disposition: A | Discharge status: Home / Self Care | Payer: Self-pay | Source: Ambulatory Visit | Attending: Obstetrics & Gynecology

## 2012-04-08 ENCOUNTER — Ambulatory Visit: Payer: Self-pay

## 2012-04-08 ENCOUNTER — Ambulatory Visit: Payer: Commercial Managed Care - HMO | Admitting: Anesthesiology

## 2012-04-08 ENCOUNTER — Ambulatory Visit: Payer: Commercial Managed Care - HMO | Admitting: Obstetrics & Gynecology

## 2012-04-08 ENCOUNTER — Ambulatory Visit
Admission: RE | Admit: 2012-04-08 | Discharge: 2012-04-08 | Disposition: A | Discharge status: Home / Self Care | Payer: Commercial Managed Care - HMO | Source: Ambulatory Visit | Attending: Obstetrics & Gynecology | Admitting: Obstetrics & Gynecology

## 2012-04-08 ENCOUNTER — Encounter: Payer: Self-pay | Admitting: Anesthesiology

## 2012-04-08 DIAGNOSIS — K5090 Crohn's disease, unspecified, without complications: Secondary | ICD-10-CM | POA: Insufficient documentation

## 2012-04-08 DIAGNOSIS — K219 Gastro-esophageal reflux disease without esophagitis: Secondary | ICD-10-CM | POA: Insufficient documentation

## 2012-04-08 DIAGNOSIS — Z87891 Personal history of nicotine dependence: Secondary | ICD-10-CM | POA: Insufficient documentation

## 2012-04-08 DIAGNOSIS — N816 Rectocele: Secondary | ICD-10-CM | POA: Insufficient documentation

## 2012-04-08 DIAGNOSIS — N8111 Cystocele, midline: Secondary | ICD-10-CM | POA: Insufficient documentation

## 2012-04-08 DIAGNOSIS — M069 Rheumatoid arthritis, unspecified: Secondary | ICD-10-CM | POA: Insufficient documentation

## 2012-04-08 DIAGNOSIS — N83209 Unspecified ovarian cyst, unspecified side: Secondary | ICD-10-CM | POA: Insufficient documentation

## 2012-04-08 DIAGNOSIS — I10 Essential (primary) hypertension: Secondary | ICD-10-CM | POA: Insufficient documentation

## 2012-04-08 DIAGNOSIS — K449 Diaphragmatic hernia without obstruction or gangrene: Secondary | ICD-10-CM | POA: Insufficient documentation

## 2012-04-08 DIAGNOSIS — E785 Hyperlipidemia, unspecified: Secondary | ICD-10-CM | POA: Insufficient documentation

## 2012-04-08 SURGERY — LAPAROSCOPIC, SALPINGO-OOPHORECTOMY
Anesthesia: Anesthesia General | Wound class: Clean

## 2012-04-08 MED ORDER — BACITRACIN 50000 UNITS IM SOLR
INTRAMUSCULAR | Status: AC
Start: 2012-04-08 — End: ?
  Filled 2012-04-08: qty 50000

## 2012-04-08 MED ORDER — FENTANYL CITRATE 0.05 MG/ML IJ SOLN
INTRAMUSCULAR | Status: AC
Start: 2012-04-08 — End: ?
  Filled 2012-04-08: qty 5

## 2012-04-08 MED ORDER — LEVOFLOXACIN IN D5W 500 MG/100ML IV SOLN
INTRAVENOUS | Status: AC
Start: 2012-04-08 — End: ?
  Filled 2012-04-08: qty 100

## 2012-04-08 MED ORDER — MIDAZOLAM HCL 2 MG/2ML IJ SOLN
INTRAMUSCULAR | Status: AC
Start: 2012-04-08 — End: ?
  Filled 2012-04-08: qty 1

## 2012-04-08 MED ORDER — LIDOCAINE HCL 2 % IJ SOLN
INTRAMUSCULAR | Status: DC | PRN
Start: 2012-04-08 — End: 2012-04-08
  Administered 2012-04-08: 80 mg

## 2012-04-08 MED ORDER — VASOPRESSIN 20 UNIT/ML IJ SOLN
INTRAMUSCULAR | Status: DC | PRN
Start: 2012-04-08 — End: 2012-04-08
  Administered 2012-04-08 (×2): 5 [IU] via SUBCUTANEOUS

## 2012-04-08 MED ORDER — LACTATED RINGERS IV SOLN
INTRAVENOUS | Status: DC
Start: 2012-04-08 — End: 2012-04-08

## 2012-04-08 MED ORDER — HYDROCODONE-ACETAMINOPHEN 5-325 MG PO TABS
1.0000 | ORAL_TABLET | Freq: Once | ORAL | Status: DC | PRN
Start: 2012-04-08 — End: 2012-04-08

## 2012-04-08 MED ORDER — LEVOFLOXACIN IN D5W 750 MG/150ML IV SOLN
INTRAVENOUS | Status: DC | PRN
Start: 2012-04-08 — End: 2012-04-08
  Administered 2012-04-08: 500 mg via INTRAVENOUS

## 2012-04-08 MED ORDER — HYDROMORPHONE HCL PF 1 MG/ML IJ SOLN
INTRAMUSCULAR | Status: DC | PRN
Start: 2012-04-08 — End: 2012-04-08
  Administered 2012-04-08 (×2): 1 mg via INTRAVENOUS

## 2012-04-08 MED ORDER — NEOSTIGMINE METHYLSULFATE 1 MG/ML IJ SOLN
INTRAMUSCULAR | Status: DC | PRN
Start: 2012-04-08 — End: 2012-04-08
  Administered 2012-04-08: 3 mg via INTRAVENOUS

## 2012-04-08 MED ORDER — HYDROMORPHONE HCL PF 1 MG/ML IJ SOLN
0.5000 mg | INTRAMUSCULAR | Status: DC | PRN
Start: 2012-04-08 — End: 2012-04-08

## 2012-04-08 MED ORDER — DIPHENHYDRAMINE HCL 50 MG/ML IJ SOLN
6.2500 mg | Freq: Four times a day (QID) | INTRAMUSCULAR | Status: DC | PRN
Start: 2012-04-08 — End: 2012-04-08

## 2012-04-08 MED ORDER — LIDOCAINE HCL (PF) 2 % IJ SOLN
INTRAMUSCULAR | Status: AC
Start: 2012-04-08 — End: ?
  Filled 2012-04-08: qty 1

## 2012-04-08 MED ORDER — ONDANSETRON HCL 4 MG/2ML IJ SOLN
INTRAMUSCULAR | Status: DC | PRN
Start: 2012-04-08 — End: 2012-04-08
  Administered 2012-04-08: 4 mg via INTRAVENOUS

## 2012-04-08 MED ORDER — LACTATED RINGERS IV SOLN
1000.0000 mL | INTRAVENOUS | Status: DC
Start: 2012-04-08 — End: 2012-04-08
  Administered 2012-04-08: 1000 mL via INTRAVENOUS

## 2012-04-08 MED ORDER — DEXAMETHASONE SODIUM PHOSPHATE 4 MG/ML IJ SOLN
INTRAMUSCULAR | Status: DC | PRN
Start: 2012-04-08 — End: 2012-04-08
  Administered 2012-04-08: 8 mg via INTRAVENOUS

## 2012-04-08 MED ORDER — LACTATED RINGERS IV SOLN
INTRAVENOUS | Status: DC | PRN
Start: 2012-04-08 — End: 2012-04-08

## 2012-04-08 MED ORDER — ROCURONIUM BROMIDE 50 MG/5ML IV SOLN
INTRAVENOUS | Status: AC
Start: 2012-04-08 — End: ?
  Filled 2012-04-08: qty 1

## 2012-04-08 MED ORDER — LIDOCAINE 1% BUFFERED - CNR/OUTSOURCED
0.3000 mL | Freq: Once | INTRAMUSCULAR | Status: AC
Start: 2012-04-08 — End: 2012-04-08
  Administered 2012-04-08: 0.3 mL via INTRADERMAL

## 2012-04-08 MED ORDER — HYDROCODONE-ACETAMINOPHEN 5-325 MG PO TABS
1.00 | ORAL_TABLET | Freq: Once | ORAL | Status: AC | PRN
Start: 2012-04-08 — End: 2012-04-18

## 2012-04-08 MED ORDER — ONDANSETRON HCL 4 MG/2ML IJ SOLN
INTRAMUSCULAR | Status: AC
Start: 2012-04-08 — End: ?
  Filled 2012-04-08: qty 2

## 2012-04-08 MED ORDER — PROPOFOL 10 MG/ML IV EMUL
INTRAVENOUS | Status: AC
Start: 2012-04-08 — End: ?
  Filled 2012-04-08: qty 1

## 2012-04-08 MED ORDER — ONDANSETRON HCL 4 MG/2ML IJ SOLN
4.0000 mg | Freq: Once | INTRAMUSCULAR | Status: DC | PRN
Start: 2012-04-08 — End: 2012-04-08

## 2012-04-08 MED ORDER — HYDROMORPHONE HCL PF 1 MG/ML IJ SOLN
INTRAMUSCULAR | Status: AC
Start: 2012-04-08 — End: 2012-04-08
  Administered 2012-04-08: 0.5 mg via INTRAVENOUS
  Filled 2012-04-08: qty 1

## 2012-04-08 MED ORDER — BUPIVACAINE HCL (PF) 0.25 % IJ SOLN
INTRAMUSCULAR | Status: AC
Start: 2012-04-08 — End: ?
  Filled 2012-04-08: qty 1

## 2012-04-08 MED ORDER — ACETAMINOPHEN 500 MG PO TABS
ORAL_TABLET | ORAL | Status: AC
Start: 2012-04-08 — End: 2012-04-08
  Administered 2012-04-08: 1000 mg via ORAL
  Filled 2012-04-08: qty 2

## 2012-04-08 MED ORDER — MIDAZOLAM HCL 2 MG/2ML IJ SOLN
INTRAMUSCULAR | Status: DC | PRN
Start: 2012-04-08 — End: 2012-04-08
  Administered 2012-04-08 (×2): 2 mg via INTRAVENOUS

## 2012-04-08 MED ORDER — FENTANYL CITRATE 0.05 MG/ML IJ SOLN
INTRAMUSCULAR | Status: DC | PRN
Start: 2012-04-08 — End: 2012-04-08
  Administered 2012-04-08: 100 ug via INTRAVENOUS
  Administered 2012-04-08 (×3): 50 ug via INTRAVENOUS

## 2012-04-08 MED ORDER — KETOROLAC TROMETHAMINE 30 MG/ML IJ SOLN
INTRAMUSCULAR | Status: AC
Start: 2012-04-08 — End: ?
  Filled 2012-04-08: qty 1

## 2012-04-08 MED ORDER — SODIUM CHLORIDE 0.9 % IJ SOLN
INTRAMUSCULAR | Status: AC
Start: 2012-04-08 — End: ?
  Filled 2012-04-08: qty 30

## 2012-04-08 MED ORDER — GLYCOPYRROLATE 0.2 MG/ML IJ SOLN
INTRAMUSCULAR | Status: DC | PRN
Start: 2012-04-08 — End: 2012-04-08
  Administered 2012-04-08: .4 mg via INTRAVENOUS

## 2012-04-08 MED ORDER — DEXAMETHASONE SODIUM PHOSPHATE 10 MG/ML IJ SOLN
INTRAMUSCULAR | Status: AC
Start: 2012-04-08 — End: ?
  Filled 2012-04-08: qty 1

## 2012-04-08 MED ORDER — LABETALOL HCL 5 MG/ML IV SOLN
INTRAVENOUS | Status: DC | PRN
Start: 2012-04-08 — End: 2012-04-08
  Administered 2012-04-08: 10 mg via INTRAVENOUS
  Administered 2012-04-08: 5 mg via INTRAVENOUS

## 2012-04-08 MED ORDER — MEPERIDINE HCL 25 MG/ML IJ SOLN
25.0000 mg | Freq: Once | INTRAMUSCULAR | Status: DC | PRN
Start: 2012-04-08 — End: 2012-04-08

## 2012-04-08 MED ORDER — HYDROMORPHONE HCL PF 1 MG/ML IJ SOLN
INTRAMUSCULAR | Status: AC
Start: 2012-04-08 — End: ?
  Filled 2012-04-08: qty 1

## 2012-04-08 MED ORDER — BUPIVACAINE HCL 0.25 % IJ SOLN
INTRAMUSCULAR | Status: DC | PRN
Start: 2012-04-08 — End: 2012-04-08
  Administered 2012-04-08: 5 mL

## 2012-04-08 MED ORDER — LABETALOL HCL 5 MG/ML IV SOLN
INTRAVENOUS | Status: AC
Start: 2012-04-08 — End: ?
  Filled 2012-04-08: qty 4

## 2012-04-08 MED ORDER — PROPOFOL 10 MG/ML IV EMUL
INTRAVENOUS | Status: DC | PRN
Start: 2012-04-08 — End: 2012-04-08
  Administered 2012-04-08: 200 mg via INTRAVENOUS

## 2012-04-08 MED ORDER — NEOSTIGMINE METHYLSULFATE 1 MG/ML IJ SOLN
INTRAMUSCULAR | Status: AC
Start: 2012-04-08 — End: ?
  Filled 2012-04-08: qty 10

## 2012-04-08 MED ORDER — KETOROLAC TROMETHAMINE 30 MG/ML IJ SOLN
INTRAMUSCULAR | Status: DC | PRN
Start: 2012-04-08 — End: 2012-04-08
  Administered 2012-04-08: 30 mg via INTRAVENOUS

## 2012-04-08 MED ORDER — EPHEDRINE SULFATE 50 MG/ML IJ SOLN
INTRAMUSCULAR | Status: DC | PRN
Start: 2012-04-08 — End: 2012-04-08
  Administered 2012-04-08: 10 mg via INTRAVENOUS
  Administered 2012-04-08: 5 mg via INTRAVENOUS

## 2012-04-08 MED ORDER — ACETAMINOPHEN 500 MG PO TABS
1000.0000 mg | ORAL_TABLET | Freq: Once | ORAL | Status: AC
Start: 2012-04-08 — End: 2012-04-08

## 2012-04-08 MED ORDER — PROMETHAZINE HCL 25 MG/ML IJ SOLN
6.2500 mg | Freq: Once | INTRAMUSCULAR | Status: DC | PRN
Start: 2012-04-08 — End: 2012-04-08

## 2012-04-08 MED ORDER — SODIUM CHLORIDE 0.9 % IJ SOLN
INTRAMUSCULAR | Status: AC
Start: 2012-04-08 — End: ?
  Filled 2012-04-08: qty 10

## 2012-04-08 MED ORDER — VASOPRESSIN 20 UNIT/ML IJ SOLN
INTRAMUSCULAR | Status: AC
Start: 2012-04-08 — End: ?
  Filled 2012-04-08: qty 1

## 2012-04-08 MED ORDER — ROCURONIUM BROMIDE 50 MG/5ML IV SOLN
INTRAVENOUS | Status: DC | PRN
Start: 2012-04-08 — End: 2012-04-08
  Administered 2012-04-08: 50 mg via INTRAVENOUS
  Administered 2012-04-08: 20 mg via INTRAVENOUS

## 2012-04-08 MED ORDER — EPHEDRINE SULFATE 50 MG/ML IJ SOLN
INTRAMUSCULAR | Status: AC
Start: 2012-04-08 — End: ?
  Filled 2012-04-08: qty 1

## 2012-04-08 MED ORDER — HYDROCODONE-ACETAMINOPHEN 5-325 MG PO TABS
1.0000 | ORAL_TABLET | Freq: Four times a day (QID) | ORAL | Status: DC | PRN
Start: 2012-04-08 — End: 2012-04-08

## 2012-04-08 MED ORDER — MEPERIDINE HCL 25 MG/ML IJ SOLN
25.0000 mg | INTRAMUSCULAR | Status: DC | PRN
Start: 2012-04-08 — End: 2012-04-08

## 2012-04-08 SURGICAL SUPPLY — 58 items
APPLCATOR CHLORAPREP 26ML (Prep) ×4 IMPLANT
BLADE S/SU RIBBACK CARB STL 11 (Blade) ×4 IMPLANT
DRAPE STERI LARGE W/TOWEL (Drape) ×4 IMPLANT
GLOVE BIOGEL POLYISO SZ7 (Glove) ×8 IMPLANT
GLOVE SRG PLISPRN ALOE 7 SNSCR 12IN LF (Glove) ×8
GLOVE SRGCL 7 PWDR FREE SMTH BD CFF POLYISOPRNE 12IN WHT THK9.4 MIL (Glove) ×4 IMPLANT
GLOVE SURGICAL 7 MEDLINE POWDER FREE (Glove) ×4 IMPLANT
GOWN OPTIMA STRL BACK OR (Gown) ×4 IMPLANT
GOWN SURG MICROCOOL STRL LG (Gown) ×12 IMPLANT
INHIBITOR ANTI FOG (Procedure Accessories) ×4 IMPLANT
IRRIGATOR SUCTN PUMP/HANDPIECE (Suction) ×4 IMPLANT
LAPAROTOMY/PELVISCOPY PACK (Pack) ×4 IMPLANT
MANIFOLD NEPTUNE II 4 PORT (Procedure Accessories) ×4 IMPLANT
MASTISOL VIAL 2/3CC STRL (Skin Closure) ×4 IMPLANT
NEEDLE INJ SFTY 22GX1.5IN (Needles) ×4 IMPLANT
NEEDLE INSFL SS 14GA 12CM LTX STRL HFLO (Needles) ×4
NEEDLE INSUFFLATION L12 CM OD14 GA (Needles) ×2 IMPLANT
NEEDLE INSUFFLATION L12 CM OD14 GA EXCEL PNEUMOPERITONEUM HIGH FLOW (Needles) ×2 IMPLANT
PAD ELECTROSRG GRND REM W CRD (Procedure Accessories) ×4 IMPLANT
PAD SANITARY L12.25 IN X W4.25 IN HEAVY ABSORBENT MOISTURE BARRIER (Dressing) IMPLANT
PAD SNTR SLK FLF CRTY 12.25X4.25IN LF NS (Dressing) ×4 IMPLANT
POUCH SPEC RTRVL PU E-CTCH GLD 10MM 34.5 (Laparoscopy Supplies) ×8
POUCH SPECIMEN RETRIEVAL L34.5 CM (Laparoscopy Supplies) ×4 IMPLANT
POUCH SPECIMEN RETRIEVAL L34.5 CM ERGONOMIC HANDLE LONG CYLINDRICAL (Laparoscopy Supplies) IMPLANT
SHEAR CURVD ERGO HNDLE 36CM (Cautery) ×2 IMPLANT
SOLUTION IRR LR 3L ARTHMTC LF PLS CNTNR (Irrigation Solutions) ×4
SOLUTION IRRIGATION LACTATED RINGERS (Irrigation Solutions) ×2 IMPLANT
SOLUTION IRRIGATION LACTATED RINGERS 3000 ML PLASTIC CONTAINER (Irrigation Solutions) ×2 IMPLANT
SUTURE ABS 2-0 CT2 VCL 27IN BRD COAT (Suture) ×8
SUTURE ABS 3-0 SH VCL 27IN BRD COAT VIOL (Suture) ×8
SUTURE COATED VICRYL 2-0 CT-2 L27 IN (Suture) ×4 IMPLANT
SUTURE COATED VICRYL 2-0 CT-2 L27 IN BRAID COATED VIOLET ABSORBABLE (Suture) ×4 IMPLANT
SUTURE COATED VICRYL 3-0 SH L27 IN BRAID (Suture) ×4 IMPLANT
SUTURE COATED VICRYL 3-0 SH L27 IN BRAID COATED VIOLET ABSORBABLE (Suture) ×4 IMPLANT
SUTURE MONOCRYL 3-0 PS2 27IN (Suture) ×6 IMPLANT
SUTURE VICRYL 0 CT2 27IN (Suture) ×4 IMPLANT
SUTURE VICRYL 0 UR6 27IN (Suture) IMPLANT
SUTURE VICRYL 2-0 CT-2 (Suture) ×8 IMPLANT
SUTURE VICRYL 3-0 CT2 27IN (Suture) IMPLANT
SYRINGE LUER LOCK 10CC (Syringes, Needles) ×12 IMPLANT
TIP SCISSORS 2 ACTION CURVE W5 MM (Endoscopic Supplies) ×2 IMPLANT
TIP SCSR CRV VM METZ 5MM 2 ACT DISP (Endoscopic Supplies) ×4
TOWEL L26 IN X W17 IN COTTON PREWASH (Procedure Accessories) ×2 IMPLANT
TOWEL L26 IN X W17 IN COTTON PREWASH DELINT BLUE ACTISORB SURGICAL (Procedure Accessories) ×2 IMPLANT
TOWEL SRG CTTN 26X17IN LF STRL PREWASH (Procedure Accessories) ×4
TRAY FOLEY URINE METER IC16 F (Catheter Micellaneous) ×2
TRAY FOLEY URINE METER IC16 F (Catheter Miscellaneous) ×2 IMPLANT
TRAY MINOR (Pack) ×4 IMPLANT
TRAY SKIN SCRUB MEDLINE PVP IODINE (Prep) ×2 IMPLANT
TRAY SKIN SCRUB MEDLINE PVP IODINE STANDARD WET GLOVE SPONGE (Prep) ×2 IMPLANT
TRAY SKN SCRB PVP IOD STD LF WT GLV SPNG (Prep) ×3
TRAY SURGICAL PREP LF (Prep) ×1
TROCAR BLADELESS ENDO 5X100MM (Laparoscopy Supplies) ×12 IMPLANT
TROCAR ENDO BLADELESS 11X100MM (Laparoscopy Supplies) IMPLANT
TUBE SET DISP HIGH FLOW (Tubing) ×1
TUBING INSFL THRMPLST 45L PNEUMOSURE (Tubing) ×3
TUBING INSUFFLATION SET HIGH FLOW (Tubing) ×2 IMPLANT
TUBING INSUFFLATION SET HIGH FLOW TOUCHSCREEN PNEUMOSURE THERMOPLASTIC (Tubing) ×2 IMPLANT

## 2012-04-08 NOTE — Transfer of Care (Signed)
Anesthesia Transfer of Care Note    Patient: Patricia [name]    Procedures performed: Procedure(s) with comments:  LAPAROSCOPIC, SALPINGO-OOPHERECTOMY  REPAIR, ANTERIOR & POSTERIOR    Anesthesia type: General ETT    Patient location:Phase I PACU    Last vitals:   Filed Vitals:    04/08/12 1548   BP: 169/74   Pulse: 83   Temp: 97.7 F (36.5 C)   Resp: 16   SpO2: 98%       Post pain: Patient not complaining of pain, continue current therapy     Mental Status:awake    Respiratory Function: toleratinig nasal cannula    Cardiovascular: stable    Nausea/Vomiting: patient not complaining of nausea or vomiting    Hydration Status: adequate    Post assessment: no apparent anesthetic complications    Pt states pain in lower abdomen, 1mg  dilaudid iv.

## 2012-04-08 NOTE — PACU (Signed)
Call placed to Dr Willaim Bane to clarify pain RX for patient. Pt states she and Dr Willaim Bane discussed Dilaudid PO for post-op pain rather than Norco. Dr park to write new RX. Updated Pt.

## 2012-04-08 NOTE — Discharge Instructions (Addendum)
[facility]  POST OPERATIVE INSTRUCTIONS FOR  EXPLORATORY LAPAROTOMY / HYSTERECTOMY    1.  You may walk as tolerated, but do not lift anything over 10 pounds or do any type of abdominal exercises for 6 weeks. You should not walk up more than two flights of stairs or drive a vehicle for two weeks after your surgery.    2.  Do not put anything in the vagina for 6 weeks. This means no sexual intercourse, tampons, douching or baths. Showers are fine.    3.  You will receive a prescription for pain medication. It is better to take Ibuprofen 600 mg every 6 hours as needed and add on Percocet or other narcotic as needed. If you have a history of an ulcer or irritable bowel, then you may substitute Tylenol for Ibuprofen. You should not drive or operate any dangerous machinery while taking Percocet, Vicodin, Tylenol # 3, or other narcotics as they can make you drowsy.    4.  Your diet the first few days should include 8 glasses of water daily to prevent constipation and/or bladder infections. The first day you are home you may want to stick to clear liquid, gelatin, soft drinks, chicken noodle soup and broth.    5.  Please take your temperature 2 times a day. You need to call the office if you develop a fever greater than 101 degrees, have any bleeding heavier than a period, or unusual/excessive abdominal pain, or any type of yellow or green vaginal discharge with an odor. If you are unable to call during business hours, please call [phone] and have the doctor on call paged.     6.  Please make a post operative appointment for 2 weeks after surgery. It is very important that you keep this appointment so that we can check your progress and to make sure that you have not developed an infection. You will also need to make a 6 week post operative appointment, which in most cases, will be the last post operative visit that you will need.      Thane Edu, MD      Post Anesthesia Discharge Instructions    Although you may be  awake and alert in the recovery room, small amounts of anesthetic remain in your system for about 24 hours.  You may feel tired and sleepy during this time.      You are advised to go directly home from the hospital.    Plan to stay at home and rest for the remainder of the day.    It is advisable to have someone with you at home for 24 hours after surgery.    Do not operate a motor vehicle, or any mechanical or electrical equipment for the next 24 hours.      Be careful when you are walking around, you may become dizzy.  The effects of anesthesia and/or medications are still present and drowsiness may occur    Do not consume alcohol, tranquilizers, sleeping medications, or any other non prescribed medication for the remainder of the day.    Diet:  begin with liquids, progress your diet as tolerated or as directed by your surgeon.  Nausea and vomiting may occur in the next 24 hours.

## 2012-04-08 NOTE — Anesthesia Postprocedure Evaluation (Signed)
Anesthesia Post Evaluation    Patient: Patricia [name]    Procedures performed: Procedure(s) with comments:  LAPAROSCOPIC, SALPINGO-OOPHERECTOMY  REPAIR, ANTERIOR & POSTERIOR    Anesthesia type: General ETT    Patient location:Phase I PACU    Last vitals:   Filed Vitals:    04/08/12 1548   BP: 169/74   Pulse: 83   Temp: 97.7 F (36.5 C)   Resp: 16   SpO2: 98%       Post pain: Patient not complaining of pain, continue current therapy     Mental Status:awake    Respiratory Function: toleratinig nasal cannula    Cardiovascular: stable    Nausea/Vomiting: patient not complaining of nausea or vomiting    Hydration Status: adequate    Post assessment: no apparent anesthetic complications

## 2012-04-08 NOTE — Op Note (Signed)
BRIEF OP NOTE    Date Time: 04/08/2012 4:21 PM    Patient Name:   Patricia [name]    Date of Operation:   04/08/2012    Providers Performing:   Surgeon(s):  Thane Edu, MD    Assistant (s):    Operative Procedure:   Procedure(s):  LAPAROSCOPIC, SALPINGO-OOPHERECTOMY  REPAIR, ANTERIOR & POSTERIOR    Preoperative Diagnosis:   Pre-Op Diagnosis Codes:     * Other and unspecified ovarian cyst [620.2]     * Cystocele, midline [618.01]     * Rectocele [618.04]    Postoperative Diagnosis:   * same    Anesthesia:   General    Estimated Blood Loss:    20 cc      Specimens:        SPECIMENS (last 24 hours)      Non-[facility] Specimens     Row Name 04/08/12 1400                Additional Information    Clinical Information ovarian cyst, cystocele     Send final report to: L. Park MD     Specimen Information    Specimen Testing Required Cytology     Specimen Description pelvic washing      Specimen Information    Specimen Testing Required Routine Pathology     Specimen ID (letter) a     Specimen Description left ovary/tube     Specimen Information    Specimen Testing Required Routine Pathology     Specimen ID (letter) b     Specimen Description right ovary/tube     Specimen Information    Specimen Testing Required --     Specimen ID (letter) --     Specimen Description --           Findings:   Normal external genitalia, vagina with moderate cystocele and mild rectocele;  Normal uterus, tubes and left ovary; right ovary with approx. 1 cm clear cyst; normal liver edge and peritoneum; appendix not visualized    Complications:   *none    Signed by: Thane Edu, MD                                                                              [place] [facility]

## 2012-04-08 NOTE — H&P (Signed)
ADMISSION HISTORY AND PHYSICAL EXAM    Date Time: 04/08/2012 12:31 PM  Patient Name: Patricia [name]  Attending Physician: Thane Edu, MD    Assessment:   Right ovarian cyst, cystocele, rectocele    Plan:   Laparoscopic BSO, A-P repair    History of Present Illness:   Patricia [name] is a 61 y.o. female who presents to the hospital with persistent right ovarian cyst, cystocele, rectocele    Past Medical History:     Past Medical History   Diagnosis Date   . Hypertensive disorder    . Hyperlipidemia    . Sleep apnea      RESOLVED   . Crohn's disease 1993   . Hiatal hernia    . GERD (gastroesophageal reflux disease)    . Kidney stone    . Rheumatoid arthritis    anxiety    Past Surgical History:     Past Surgical History   Procedure Date   . Tummy tuck 2010   . Breast bx left  1994   . Basal cell mohs 2008   . Cysto-kidney stone 12/2011   . Egd/colonoscopy 2011   2004 D and C    Family History:     Family History   Problem Relation Age of Onset   . Kidney failure Mother    . Esophageal cancer Father        Social History:     History     Social History   . Marital Status: Married     Spouse Name: N/A     Number of Children: N/A   . Years of Education: N/A     Social History Main Topics   . Smoking status: Former Smoker -- 1.0 packs/day for 4 years     Quit date: 04/04/1978   . Smokeless tobacco: Never Used   . Alcohol Use: 0.6 oz/week     1 Glasses of wine per week      2 X A WEEK   . Drug Use: No   . Sexually Active:      Other Topics Concern   . Not on file     Social History Narrative   . No narrative on file       Allergies:     Allergies   Allergen Reactions   . Cefuroxime Other (See Comments)     DOESN'T INTERACT W/ MEDS   . Asacol (Mesalamine)    . Other      6MP(chemo drug);and adhesive  Tape   . Sulfa Antibiotics Rash       Medications:     Prescriptions prior to admission   Medication Sig   . adalimumab (HUMIRA) 40 MG/0.8ML injection Inject 80 mg into the skin every 14 (fourteen) days.   . benazepril  (LOTENSIN) 20 MG tablet Take 20 mg by mouth every morning.   . bimatoprost (LATISSE) 0.03 % ophthalmic solution Place into both eyes nightly. Place one drop on applicator and apply evenly along the skin of the upper eyelid at base of eyelashes once daily at bedtime; repeat procedure for second eye (use a clean applicator).   . Calcium Carbonate-Vitamin D (CALTRATE 600+D) 600-400 MG-UNIT per tablet Take 2 tablets by mouth daily.   . carvedilol (COREG) 12.5 MG tablet Take 12.5 mg by mouth 2 (two) times daily with meals.   . clonAZEpam (KLONOPIN) 1 MG tablet Take 1 mg by mouth nightly.   . Cranberry 250 MG CAPS Take by mouth 2 (  two) times daily.   . Cyanocobalamin (VITAMIN B-12 IJ) Inject 1,000 mcg as directed every 30 (thirty) days.   . DULoxetine (CYMBALTA) 60 MG capsule Take 60 mg by mouth 2 (two) times daily.   . ergocalciferol (ERGOCALCIFEROL) 50000 UNITS capsule Take 50,000 Units by mouth once a week.   . fish oil-omega-3 fatty acids 1000 MG capsule Take 2 capsules by mouth daily.   . hydrochlorothiazide (HYDRODIURIL) 25 MG tablet Take 25 mg by mouth every morning.   Marland Kitchen HYDROmorphone (DILAUDID) 4 MG tablet Take 4 mg by mouth every 4 (four) hours as needed.   . lansoprazole (PREVACID) 30 MG capsule Take 30 mg by mouth every morning.   . Multiple Vitamin (MULTIVITAMIN) tablet Take 1 tablet by mouth daily.   . potassium chloride (K-DUR) 10 MEQ tablet Take 10 mEq by mouth nightly.   . pregabalin (LYRICA) 75 MG capsule Take 75 mg by mouth 2 (two) times daily.   . Tapentadol HCl (NUCYNTA ER) 150 MG TB12 Take by mouth 2 (two) times daily.   . Vitamins-Lipotropics (LIPO-FLAVONOID PLUS) TABS Take 1 capsule by mouth 3 (three) times daily.   Marland Kitchen zolpidem (AMBIEN) 10 MG tablet Take 10 mg by mouth nightly as needed.       Physical Exam:     Filed Vitals:    04/08/12 1113   BP: 159/86   Pulse: 93   Temp: 99.4 F (37.4 C)   Resp: 17   SpO2: 96%       Chest - clear to auscultation, no wheezes, rales or rhonchi, symmetric air  entry  Heart - normal rate, regular rhythm, normal S1, S2, no murmurs, rubs, clicks or gallops  Abdomen - soft, nontender, nondistended, no masses or organomegaly  Pelvic - normal external genitalia, vagina with moderate cystocele, mild rectocele; normal cervix, uterus, and adnexae      Signed by: Thane Edu

## 2012-04-09 NOTE — Op Note (Signed)
Procedure Date: 04/08/2012     Patient Type: A     SURGEON: Thane Edu MD  ASSISTANT:       PREOPERATIVE DIAGNOSES:  Persistent right ovarian cyst, cystocele and rectocele.       POSTOPERATIVE DIAGNOSES:  Persistent right ovarian cyst, cystocele and rectocele.       TITLE OF PROCEDURE:  Operative laparoscopy with bilateral salpingo-oophorectomy, anterior and  posterior repair.     ANESTHESIA:  General.      ESTIMATED BLOOD LOSS:  20 mL.     COMPLICATIONS:  None.      FINDINGS:  Normal uterus, fallopian tubes, and left ovary.  The right ovary had  approximately 1 cm clear appearing cyst.  Normal liver edge and peritoneum,  appendix was not visualized.     CLINICAL NOTE:  The patient is a 61 year old who was noted to have a persistent right  ovarian cyst which was complex and approximately 1.4 cm.  The patient also  complained of her moderate cystocele causing pelvic and vaginal pressure  and wanted this repaired along with her mild rectocele.  Due to the size of  the cystocele and the patient has complained of rare leakage, the patient  underwent multichannel urodynamics, which showed no leakage despite a full  bladder and with reducing the cystocele, so the decision was made to  proceed with just a bilateral salpingo-oophorectomy, anterior and posterior  repair.  Risks and benefits of the procedure were discussed, including  infection, bleeding, damage to the bowel and bladder, future prolapse or  incontinence.  The patient understood these risks and wanted to proceed.     DESCRIPTION OF PROCEDURE:  The patient was taken to the operating room and general anesthesia was  induced.  She was placed in the dorsal lithotomy position and prepped and  draped in the usual fashion.  A Foley catheter was placed.  Then, a Hulka  manipulator was inserted.  Then, changing gown and gloves, attention was  turned to the abdomen.  An infraumbilical skin incision was made over the  patient's previous scars for her abdominoplasty.  The  Veress needle was  inserted and saline test was done, then CO2 gas was insufflated.  However,  it was noted that the preperitoneal area was filled with gas.  After a  second attempt to place the Veress needle intraperitoneally with no  success.  Attention was then turned to the left costal area below this rib.   A small incision was made and extended with hemostats and then the Veress  needle was inserted and CO2 gas was insufflated.  The 5-mm trocar was  placed without difficulty.  Then, a second 5-mm trocar was placed in the  infraumbilical incision, and a 10-mm trocar was placed in the midline  suprapubic position.  The anatomy was noted.  The right ovary was closely  inspected and only a clear appearing cyst was noted.  The cytology was  obtained.  Then, the right uteroovarian ligament was grasped and the right  fallopian tube and ligament was cut with the Harmonic scalpel with good  hemostasis noted.  The mesosalpinx was cut.  Then, the infundibulopelvic  ligament was grasped and incised with the Harmonic scalpel.  Good  hemostasis was noted following this procedure.  The right tube and ovary  were placed in the posterior cul-de-sac.  The same procedure was carried  out on the left tube and ovary without complications.  Then using  endopouches, the left tube and  ovary were removed after enlarging the  incision; however, the right Endopouch ruptured.  Then, the ovary was  removed in a piecemeal fashion through the incision.  This was all sent to  pathology.  The incision as well the pelvis was then copiously irrigated  and the CO2 gas was allowed to partially escape and the pedicles were  examined once more, and with no bleeding noted, pictures were taken, the  CO2 gas was completely allowed to escape.  Then, the fascia of the  suprapubic incision was reapproximated using 0 Vicryl.  The incisions were  injected with 0.25% Marcaine and the skin was reapproximated using 3-0  Monocryl in a subcuticular fashion.   Attention was then turned to the  vaginal portion.  The moderate cystocele was examined.  Then, the midline  was injected with a Pitressin solution of 20 units and 40 mL of normal  saline for good hydrodissection.  The midline was then incised with a  scalpel.  Then sharp and blunt dissection was used to separate the vaginal  mucosa from the underlying perivesical fascia.  Due to the size of the  cystocele an initial reduction was done placing a pursestring suture around  the cystocele.  Then, plication sutures of 0 Vicryl were placed with  excellent reduction of the cystocele.  The vagina was then reapproximated  using 2-0 Vicryl in an alternating running and locked fashion.  The  posterior repair was then done.  The midline was initially injected with  the Pitressin solution and the midline incised with a scalpel and then  sharp and blunt dissection was done to remove the vaginal mucosa from the  underlying perirectal fascia.  The rectocele was initially reduced doing a  small pursestring suture around the rectocele.  Then, the plication sutures  of 0 Vicryl were placed with excellent reduction of the rectocele.  Good  hemostasis was noted following these procedures.  The procedure was then  ended.  The Foley catheter was removed.  The patient was awakened and taken  to recovery in stable condition.           D:  04/08/2012 21:20 PM by Dr. Thane Edu, MD ([id number])  T:  04/09/2012 08:12 AM by [id number]      Everlean Cherry: [id number]) (Doc ID: [id number])

## 2012-08-02 LAB — ECG 12-LEAD
Atrial Rate: 77 {beats}/min
P Axis: 42 degrees
P-R Interval: 210 ms
Q-T Interval: 376 ms
QRS Duration: 80 ms
QTC Calculation (Bezet): 425 ms
R Axis: 0 degrees
T Axis: 25 degrees
Ventricular Rate: 77 {beats}/min

## 2013-02-07 ENCOUNTER — Emergency Department: Payer: Commercial Managed Care - HMO

## 2013-02-07 ENCOUNTER — Observation Stay: Payer: Commercial Managed Care - HMO | Admitting: Internal Medicine

## 2013-02-07 ENCOUNTER — Observation Stay
Admission: EM | Admit: 2013-02-07 | Discharge: 2013-02-08 | Disposition: A | Discharge status: Home / Self Care | Payer: Commercial Managed Care - HMO | Attending: Family Medicine | Admitting: Family Medicine

## 2013-02-07 ENCOUNTER — Observation Stay: Payer: Commercial Managed Care - HMO

## 2013-02-07 DIAGNOSIS — R079 Chest pain, unspecified: Principal | ICD-10-CM | POA: Insufficient documentation

## 2013-02-07 DIAGNOSIS — Z87891 Personal history of nicotine dependence: Secondary | ICD-10-CM | POA: Insufficient documentation

## 2013-02-07 DIAGNOSIS — E041 Nontoxic single thyroid nodule: Secondary | ICD-10-CM | POA: Insufficient documentation

## 2013-02-07 DIAGNOSIS — I10 Essential (primary) hypertension: Secondary | ICD-10-CM | POA: Insufficient documentation

## 2013-02-07 DIAGNOSIS — R1011 Right upper quadrant pain: Secondary | ICD-10-CM | POA: Insufficient documentation

## 2013-02-07 DIAGNOSIS — Z800 Family history of malignant neoplasm of digestive organs: Secondary | ICD-10-CM | POA: Insufficient documentation

## 2013-02-07 DIAGNOSIS — M069 Rheumatoid arthritis, unspecified: Secondary | ICD-10-CM | POA: Insufficient documentation

## 2013-02-07 DIAGNOSIS — K5090 Crohn's disease, unspecified, without complications: Secondary | ICD-10-CM

## 2013-02-07 DIAGNOSIS — R739 Hyperglycemia, unspecified: Secondary | ICD-10-CM

## 2013-02-07 DIAGNOSIS — R7309 Other abnormal glucose: Secondary | ICD-10-CM | POA: Insufficient documentation

## 2013-02-07 DIAGNOSIS — R10811 Right upper quadrant abdominal tenderness: Secondary | ICD-10-CM

## 2013-02-07 LAB — TSH: TSH: 2.24 u[IU]/mL (ref 0.35–4.94)

## 2013-02-07 LAB — CBC AND DIFFERENTIAL
Basophils Absolute Automated: 0.03 10*3/uL (ref 0.00–0.20)
Basophils Automated: 1 %
Eosinophils Absolute Automated: 0.09 10*3/uL (ref 0.00–0.70)
Eosinophils Automated: 2 %
Hematocrit: 41.2 % (ref 37.0–47.0)
Hgb: 13.8 g/dL (ref 12.0–16.0)
Lymphocytes Absolute Automated: 1.3 10*3/uL (ref 0.50–4.40)
Lymphocytes Automated: 27 %
MCH: 31.5 pg (ref 28.0–32.0)
MCHC: 33.5 g/dL (ref 32.0–36.0)
MCV: 94.1 fL (ref 80.0–100.0)
MPV: 9.6 fL (ref 9.4–12.3)
Monocytes Absolute Automated: 0.44 10*3/uL (ref 0.00–1.20)
Monocytes: 9 %
Neutrophils Absolute: 2.97 10*3/uL (ref 1.80–8.10)
Neutrophils: 62 %
Platelets: 248 10*3/uL (ref 140–400)
RBC: 4.38 10*6/uL (ref 4.20–5.40)
RDW: 13 % (ref 12–15)
WBC: 4.83 10*3/uL (ref 3.50–10.80)

## 2013-02-07 LAB — COMPREHENSIVE METABOLIC PANEL
ALT: 32 U/L (ref 0–55)
AST (SGOT): 22 U/L (ref 5–34)
Albumin/Globulin Ratio: 0.9 (ref 0.9–2.2)
Albumin: 3.2 g/dL — ABNORMAL LOW (ref 3.5–5.0)
Alkaline Phosphatase: 91 U/L (ref 40–150)
Anion Gap: 9 (ref 5.0–15.0)
BUN: 16 mg/dL (ref 7.0–19.0)
Bilirubin, Total: 0.6 mg/dL (ref 0.2–1.2)
CO2: 25 mEq/L (ref 22–29)
Calcium: 9.5 mg/dL (ref 8.5–10.5)
Chloride: 102 mEq/L (ref 98–107)
Creatinine: 0.8 mg/dL (ref 0.6–1.0)
Globulin: 3.6 g/dL (ref 2.0–3.6)
Glucose: 256 mg/dL — ABNORMAL HIGH (ref 70–100)
Potassium: 4.1 mEq/L (ref 3.5–5.1)
Protein, Total: 6.8 g/dL (ref 6.0–8.3)
Sodium: 136 mEq/L (ref 136–145)

## 2013-02-07 LAB — GFR: EGFR: >60

## 2013-02-07 LAB — IHS D-DIMER: D-Dimer: 0.56 ug/mL FEU — ABNORMAL HIGH (ref 0.00–0.51)

## 2013-02-07 LAB — CK
Creatine Kinase (CK): 51 U/L (ref 29–168)
Creatine Kinase (CK): 39 U/L (ref 29–168)

## 2013-02-07 LAB — TROPONIN I: Troponin I: <0.01 ng/mL (ref 0.00–0.09)

## 2013-02-07 MED ORDER — NITROGLYCERIN 2 % TD OINT
1.00 [in_us] | TOPICAL_OINTMENT | Freq: Once | TRANSDERMAL | Status: AC
Start: 2013-02-07 — End: 2013-02-07
  Administered 2013-02-07: 1 [in_us] via TOPICAL
  Filled 2013-02-07: qty 1

## 2013-02-07 MED ORDER — PANTOPRAZOLE SODIUM 40 MG PO TBEC
40.0000 mg | DELAYED_RELEASE_TABLET | Freq: Two times a day (BID) | ORAL | Status: DC
Start: 2013-02-07 — End: 2013-02-08
  Administered 2013-02-08: 40 mg via ORAL
  Filled 2013-02-07 (×2): qty 1

## 2013-02-07 MED ORDER — ACETAMINOPHEN 500 MG PO TABS
1000.00 mg | ORAL_TABLET | Freq: Once | ORAL | Status: AC
Start: 2013-02-07 — End: 2013-02-07
  Administered 2013-02-07: 1000 mg via ORAL
  Filled 2013-02-07: qty 2

## 2013-02-07 MED ORDER — DULOXETINE HCL 60 MG PO CPEP
60.00 mg | ORAL_CAPSULE | Freq: Two times a day (BID) | ORAL | Status: DC
Start: 2013-02-07 — End: 2013-02-08
  Administered 2013-02-07 – 2013-02-08 (×2): 60 mg via ORAL
  Filled 2013-02-07 (×3): qty 1

## 2013-02-07 MED ORDER — PREGABALIN 25 MG PO CAPS
75.0000 mg | ORAL_CAPSULE | Freq: Two times a day (BID) | ORAL | Status: DC
Start: 2013-02-07 — End: 2013-02-08
  Administered 2013-02-07 – 2013-02-08 (×2): 75 mg via ORAL
  Filled 2013-02-07 (×2): qty 3

## 2013-02-07 MED ORDER — IODIXANOL 320 MG/ML IV SOLN
100.00 mL | Freq: Once | INTRAVENOUS | Status: AC | PRN
Start: 2013-02-07 — End: 2013-02-07
  Administered 2013-02-07: 100 mL via INTRAVENOUS

## 2013-02-07 MED ORDER — ASPIRIN 81 MG PO CHEW
324.00 mg | CHEWABLE_TABLET | Freq: Once | ORAL | Status: AC
Start: 2013-02-07 — End: 2013-02-07
  Administered 2013-02-07: 324 mg via ORAL
  Filled 2013-02-07: qty 4

## 2013-02-07 MED ORDER — ZOLPIDEM TARTRATE 5 MG PO TABS
10.0000 mg | ORAL_TABLET | Freq: Every evening | ORAL | Status: DC
Start: 2013-02-07 — End: 2013-02-08
  Administered 2013-02-07: 10 mg via ORAL
  Filled 2013-02-07: qty 2

## 2013-02-07 MED ORDER — PATIENT SUPPLIED NON FORMULARY
1.00 [drp] | Freq: Every evening | Status: DC
Start: 2013-02-07 — End: 2013-02-08

## 2013-02-07 MED ORDER — SODIUM CHLORIDE 0.9 % IV SOLN
INTRAVENOUS | Status: DC
Start: 2013-02-07 — End: 2013-02-08

## 2013-02-07 MED ORDER — ONDANSETRON HCL 4 MG/2ML IJ SOLN
4.0000 mg | INTRAMUSCULAR | Status: DC | PRN
Start: 2013-02-07 — End: 2013-02-08

## 2013-02-07 MED ORDER — HYDROMORPHONE HCL PF 1 MG/ML IJ SOLN
1.0000 mg | INTRAMUSCULAR | Status: DC | PRN
Start: 2013-02-07 — End: 2013-02-08
  Administered 2013-02-07 (×2): 1 mg via INTRAVENOUS
  Filled 2013-02-07 (×2): qty 1

## 2013-02-07 MED ORDER — HYDROCHLOROTHIAZIDE 25 MG PO TABS
25.0000 mg | ORAL_TABLET | Freq: Every morning | ORAL | Status: DC
Start: 2013-02-08 — End: 2013-02-08
  Administered 2013-02-08: 25 mg via ORAL
  Filled 2013-02-07: qty 1

## 2013-02-07 MED ORDER — CLONAZEPAM 0.5 MG PO TABS
1.0000 mg | ORAL_TABLET | Freq: Every evening | ORAL | Status: DC
Start: 2013-02-07 — End: 2013-02-08
  Administered 2013-02-07: 1 mg via ORAL
  Filled 2013-02-07: qty 2

## 2013-02-07 MED ORDER — NITROGLYCERIN 0.4 MG SL SUBL
0.4000 mg | SUBLINGUAL_TABLET | SUBLINGUAL | Status: DC | PRN
Start: 2013-02-07 — End: 2013-02-08

## 2013-02-07 MED ORDER — ACETAMINOPHEN 325 MG PO TABS
650.0000 mg | ORAL_TABLET | ORAL | Status: DC | PRN
Start: 2013-02-07 — End: 2013-02-08

## 2013-02-07 MED ORDER — ASPIRIN 81 MG PO CHEW
324.00 mg | CHEWABLE_TABLET | Freq: Every day | ORAL | Status: DC
Start: 2013-02-07 — End: 2013-02-08
  Administered 2013-02-08: 324 mg via ORAL
  Filled 2013-02-07: qty 4

## 2013-02-07 MED ORDER — MORPHINE SULFATE 2 MG/ML IJ/IV SOLN (WRAP)
2.0000 mg | Status: DC | PRN
Start: 2013-02-07 — End: 2013-02-07
  Administered 2013-02-07: 2 mg via INTRAVENOUS
  Filled 2013-02-07: qty 1

## 2013-02-07 MED ORDER — LISINOPRIL 20 MG PO TABS
20.0000 mg | ORAL_TABLET | Freq: Every day | ORAL | Status: DC
Start: 2013-02-07 — End: 2013-02-08
  Administered 2013-02-08: 20 mg via ORAL
  Filled 2013-02-07 (×2): qty 1

## 2013-02-07 MED ORDER — CARVEDILOL 12.5 MG PO TABS
12.5000 mg | ORAL_TABLET | Freq: Two times a day (BID) | ORAL | Status: DC
Start: 2013-02-07 — End: 2013-02-08
  Administered 2013-02-07 – 2013-02-08 (×2): 12.5 mg via ORAL
  Filled 2013-02-07 (×2): qty 1

## 2013-02-07 NOTE — Plan of Care (Signed)
Admitted from [facility] via [facility]. Alert, denies chest pain at this time. No dyspnea noted. Oriented to room and call light. Will continue to monitor.

## 2013-02-07 NOTE — ED Provider Notes (Signed)
Physician/Midlevel provider first contact with patient: 02/07/13 1121         History     Chief Complaint   Patient presents with   . Chest Pain     Patient is a 62 y.o. female presenting with chest pain. The history is provided by the patient.   Chest Pain  Episode onset: 10am. Duration of episode(s) is 20 minutes. Chest pain occurs constantly. The chest pain is improving. Associated with: nothing. The pain is currently at 1/10. The quality of the pain is described as sharp. Radiates to: both sides of the chest from the sternum. Exacerbated by: nothing. Pertinent negatives for primary symptoms include no fever, no shortness of breath, no cough, no palpitations, no abdominal pain, no nausea and no vomiting.   Pertinent negatives for associated symptoms include no numbness and no weakness. She tried nothing for the symptoms.   Her past medical history is significant for hyperlipidemia and hypertension.   Pertinent negatives for family medical history include: no CAD in family.         Past Medical History   Diagnosis Date   . Hypertensive disorder    . Hyperlipidemia    . Sleep apnea      RESOLVED   . Crohn's disease 1993   . Hiatal hernia    . GERD (gastroesophageal reflux disease)    . Kidney stone    . Rheumatoid arthritis(714.0)    . Diabetes      pt states borderline       Past Surgical History   Procedure Date   . Tummy tuck 2010   . Breast bx left  1994   . Basal cell mohs 2008   . Cysto-kidney stone 12/2011   . Egd/colonoscopy 2011   . Ovarian cyst removal    . Bilateral salpingoophorectomy 04/2012       Family History   Problem Relation Age of Onset   . Kidney failure Mother    . Esophageal cancer Father        Social  History   Substance Use Topics   . Smoking status: Former Smoker -- 1.0 packs/day for 4 years     Quit date: 04/04/1978   . Smokeless tobacco: Never Used   . Alcohol Use: 1.2 oz/week     2 Glasses of wine per week      Comment: 2 glasses wine per night       .     Allergies   Allergen Reactions    . Cefuroxime Other (See Comments)     DOESN'T INTERACT W/ MEDS   . Asacol (Mesalamine)    . Other      6MP(chemo drug);and adhesive  Tape   . Sulfa Antibiotics Rash       Current/Home Medications    ADALIMUMAB (HUMIRA) 40 MG/0.8ML INJECTION    Inject 40 mg into the skin every 14 (fourteen) days.     BENAZEPRIL (LOTENSIN) 20 MG TABLET    Take 20 mg by mouth every morning.    BIMATOPROST (LATISSE) 0.03 % OPHTHALMIC SOLUTION    Place into both eyes nightly. Place one drop on applicator and apply evenly along the skin of the upper eyelid at base of eyelashes once daily at bedtime; repeat procedure for second eye (use a clean applicator).    CALCIUM CARBONATE-VITAMIN D (CALTRATE 600+D) 600-400 MG-UNIT PER TABLET    Take 2 tablets by mouth daily.    CARVEDILOL (COREG) 12.5 MG TABLET  Take 12.5 mg by mouth 2 (two) times daily with meals.    CLONAZEPAM (KLONOPIN) 1 MG TABLET    Take 1 mg by mouth nightly.    CRANBERRY 250 MG CAPS    Take by mouth 2 (two) times daily.    CYANOCOBALAMIN (VITAMIN B-12 IJ)    Inject 1,000 mcg as directed every 30 (thirty) days.    DULOXETINE (CYMBALTA) 60 MG CAPSULE    Take 60 mg by mouth 2 (two) times daily.    ERGOCALCIFEROL (ERGOCALCIFEROL) 50000 UNITS CAPSULE    Take 50,000 Units by mouth once a week.    HYDROCHLOROTHIAZIDE (HYDRODIURIL) 25 MG TABLET    Take 25 mg by mouth every morning.    LANSOPRAZOLE (PREVACID) 30 MG CAPSULE    Take 30 mg by mouth every morning.    MULTIPLE VITAMIN (MULTIVITAMIN) TABLET    Take 1 tablet by mouth daily.    POTASSIUM CHLORIDE (K-DUR) 10 MEQ TABLET    Take 10 mEq by mouth nightly.    PREGABALIN (LYRICA) 75 MG CAPSULE    Take 75 mg by mouth 2 (two) times daily.    TAPENTADOL HCL (NUCYNTA ER) 150 MG TB12    Take by mouth 2 (two) times daily.    VITAMINS-LIPOTROPICS (LIPO-FLAVONOID PLUS) TABS    Take 1 capsule by mouth 3 (three) times daily.    ZOLPIDEM (AMBIEN) 10 MG TABLET    Take 10 mg by mouth nightly as needed.        Review of Systems    Constitutional: Negative for fever and chills.   HENT: Negative for congestion, sore throat and rhinorrhea.    Eyes: Negative for pain and visual disturbance.   Respiratory: Negative for cough and shortness of breath.    Cardiovascular: Positive for chest pain. Negative for palpitations.   Gastrointestinal: Negative for nausea, vomiting and abdominal pain.   Genitourinary: Negative for dysuria and frequency.   Musculoskeletal: Negative for myalgias and back pain.   Skin: Negative for color change and rash.   Neurological: Negative for weakness and numbness.       Physical Exam    BP 127/69  Pulse 86  Temp 98.2 F (36.8 C) (Temporal Artery)  Resp 16  Ht 1.676 m  Wt 90.719 kg  BMI 32.30 kg/m2  SpO2 93%    Physical Exam   Constitutional: She is oriented to person, place, and time. She appears well-developed and well-nourished.   HENT:   Head: Normocephalic and atraumatic.   Eyes: Conjunctivae normal and EOM are normal. Right eye exhibits no discharge. Left eye exhibits no discharge. No scleral icterus.   Neck: No JVD present. No tracheal deviation present.   Cardiovascular: Normal rate, regular rhythm and normal heart sounds.  Exam reveals no gallop and no friction rub.    No murmur heard.  Pulmonary/Chest: Effort normal and breath sounds normal. No stridor. No respiratory distress. She has no wheezes. She has no rales.   Abdominal: Soft. Bowel sounds are normal. She exhibits no distension and no mass. There is no tenderness. There is no rebound, no guarding and no CVA tenderness.   Musculoskeletal: Normal range of motion. She exhibits no edema and no tenderness.   Neurological: She is alert and oriented to person, place, and time. She has normal strength. No sensory deficit. GCS eye subscore is 4. GCS verbal subscore is 5. GCS motor subscore is 6.   Skin: Skin is warm, dry and intact.   Psychiatric: She has a normal  mood and affect. Her speech is normal and behavior is normal. Judgment and thought content  normal.       MDM and ED Course     ED Medication Orders      Start     Status Ordering Provider    02/07/13 1300   iodixanol (VISIPAQUE) 320 MG/ML injection 100 mL   IMG once as needed      Route: Intravenous  Ordered Dose: 100 mL         Last MAR action:  Given Finola Rosal    02/07/13 1154   aspirin chewable tablet 324 mg   Once      Route: Oral  Ordered Dose: 324 mg         Last MAR action:  Given Aldo Sondgeroth    02/07/13 1154   nitroglycerin (NITRO-BID) 2 % oint ointment 1 inch   Once      Route: Topical  Ordered Dose: 1 inch         Last MAR action:  Given Arland Usery                 MDM  Number of Diagnoses or Management Options  Diagnosis management comments: I, [name] MD, have been the primary provider for Thelma Comp during this Emergency Dept visit.        Due to program issues in the EPIC system, timed orders including medications, IV fluids and drips show up as posted 30-45 minutes AFTER they were ordered by me. Nurses know and are notified verbally to give medications STAT in the ED.     EKG: NSR&R, NL axis, NL intervals, no acute st-t changes no change from 12/13/11    Patient with multiple risk factors for CAD. Abnormal EKG. Requires admission for additional evaluation of cardiac etiology for chest pain. Also discussed need for outpatient f/u of BSG and thyroid nodule.     Procedures    Clinical Impression & Disposition     Clinical Impression  Final diagnoses:   Chest pain with moderate risk for cardiac etiology   Thyroid nodule   Hyperglycemia        ED Disposition     Admit Bed Type: Telemetry [5]  Admitting Physician: Brynda Rim [[id number]]  Patient Class: Observation [104]             New Prescriptions    No medications on file               [name], MD  02/07/13 1405

## 2013-02-07 NOTE — Progress Notes (Signed)
PM assessment completed. Pt resting in bed. Pt c/o headache and general arthritis pain. Med with dilaudid. ivl patent. Husband at bedside. Pt updated and aware of POC. No further c/o. No distress. Will monitor.

## 2013-02-07 NOTE — ED Notes (Signed)
Chest pain started approx one hour ago.  Lasted approx 20 minutes.  Was sharp, midsternal, radiating back and through both breasts.  Some tingling in left arm.

## 2013-02-07 NOTE — ED Notes (Signed)
Report given to PTS.  Being transferred to [facility] for admission.

## 2013-02-07 NOTE — ED Notes (Signed)
MD at bedside. 

## 2013-02-07 NOTE — ED Notes (Signed)
Lunch ordered 

## 2013-02-07 NOTE — H&P (Signed)
Patricia [name] RUE:[id number],NWG:[id number]  Outpatient Primary MD for the patient is Lavetta Nielsen, MD  With History of -  Past Medical History   Diagnosis Date   . Hypertensive disorder    . Hyperlipidemia    . Sleep apnea      RESOLVED   . Crohn's disease 1993   . Hiatal hernia    . GERD (gastroesophageal reflux disease)    . Kidney stone    . Rheumatoid arthritis(714.0)    . Diabetes      pt states borderline      Past Surgical History   Procedure Date   . Tummy tuck 2010   . Breast bx left  1994   . Basal cell mohs 2008   . Cysto-kidney stone 12/2011   . Egd/colonoscopy 2011   . Ovarian cyst removal    . Bilateral salpingoophorectomy 04/2012     in for   Chief Complaint   Patient presents with   . Chest Pain      HPI  Patricia [name] is a 62 y.o. femalewith a past medical history of hypertension, IBD (Crohn disease), osteoarthritis, rheumatoid arthritis, crohn's disease who comes in c/o an episode of RS CP, 10 severity, radiating to left arm , associated with mild diphoresis while she was packing sitting on ground for upcoming trip tomorrow. Pain lasted for approx 20 mins with slow improvement and subsequent resolution    ?  ?  Review of Systems   In addition to the HPI above  No Fever-chills,  No Headache, No changes with Vision or hearing,  No problems swallowing food or Liquids,  No  Cough or Shortness of Breath,  No Abdominal pain, No Nausea or Vommitting, Bowel movements are regular,  No Blood in stool or Urine,  No dysuria,  No new skin rashes or bruises,  No new joints pains-aches,   No new weakness, tingling, numbness in any extremity,  No recent weight gain or loss,  No polyuria, polydypsia or polyphagia,  No significant Mental Stressors.  A full 10 point Review of Systems was done, except as stated above, all other Review of Systems were negative.  ?  Social History  History   Substance Use Topics   . Smoking status: Former Smoker -- 1.0 packs/day for 4 years     Quit date: 04/04/1978   . Smokeless  tobacco: Never Used   . Alcohol Use: 1.2 oz/week     2 Glasses of wine per week      Comment: 2 glasses wine per night     Family History   Problem Relation Age of Onset   . Kidney failure Mother    . Esophageal cancer Father    positive for CAD    Prior to Admission medications    Medication Sig Start Date End Date Taking? Authorizing Provider   adalimumab (HUMIRA) 40 MG/0.8ML injection Inject 40 mg into the skin every 14 (fourteen) days.     [provider]   benazepril (LOTENSIN) 20 MG tablet Take 20 mg by mouth every morning.    [provider]   bimatoprost (LATISSE) 0.03 % ophthalmic solution Place into both eyes nightly. Place one drop on applicator and apply evenly along the skin of the upper eyelid at base of eyelashes once daily at bedtime; repeat procedure for second eye (use a clean applicator).    [provider]   Calcium Carbonate-Vitamin D (CALTRATE 600+D) 600-400 MG-UNIT per tablet Take 2 tablets by mouth daily.  [provider]   carvedilol (COREG) 12.5 MG tablet Take 12.5 mg by mouth 2 (two) times daily with meals.    [provider]   clonAZEpam (KLONOPIN) 1 MG tablet Take 1 mg by mouth nightly.    [provider]   Cranberry 250 MG CAPS Take by mouth 2 (two) times daily.    [provider]   Cyanocobalamin (VITAMIN B-12 IJ) Inject 1,000 mcg as directed every 30 (thirty) days.    [provider]   DULoxetine (CYMBALTA) 60 MG capsule Take 60 mg by mouth 2 (two) times daily.    [provider]   ergocalciferol (ERGOCALCIFEROL) 50000 UNITS capsule Take 50,000 Units by mouth once a week.    [provider]   hydrochlorothiazide (HYDRODIURIL) 25 MG tablet Take 25 mg by mouth every morning.    [provider]   lansoprazole (PREVACID) 30 MG capsule Take 30 mg by mouth every morning.    [provider]   Multiple Vitamin (MULTIVITAMIN) tablet Take 1 tablet by mouth daily.    [provider]   potassium chloride (K-DUR) 10 MEQ tablet Take 10 mEq by mouth nightly.    [provider]   pregabalin (LYRICA) 75 MG capsule Take 75 mg by mouth 2 (two) times daily.    [provider]   Tapentadol HCl (NUCYNTA ER) 150 MG TB12 Take by mouth 2 (two) times daily.    [provider]   Vitamins-Lipotropics (LIPO-FLAVONOID PLUS) TABS Take 1 capsule by mouth 3 (three) times daily.    [provider]   zolpidem (AMBIEN) 10 MG tablet Take 10 mg by mouth nightly as needed.    [provider]     Allergies   Allergen Reactions   . Cefuroxime Other (See Comments)     DOESN'T INTERACT W/ MEDS   . Asacol (Mesalamine)    . Other      6MP(chemo drug);and adhesive  Tape   . Sulfa Antibiotics Rash     Physical Exam  Vitals  Blood pressure 122/73, pulse 99, temperature 96.8 F (36 C), temperature source Temporal Artery, resp. rate 16, height 1.676 m (5\' 6" ), weight 93.2 kg (205 lb 7.5 oz), SpO2 96.00%.  ?  1. Genera:  lying in bed in NAD,  2. Normal affect and insight, Not Suicidal or Homicidal, Awake Alert, Oriented *3.  3. No F.N deficits, ALL C.Nerves Intact, Strength 5/5 all 4 extremities, Sensation intact all 4 extremities, Plantars down going.  4. Ears and Eyes appear Normal, Conjunctivae clear, PERRLA. Moist Oral Mucosa.  5. Supple Neck, No JVD, No cervical lymphadenopathy appriciated, No Carotid Bruits.  6. Symmetrical Chest wall movement, Good air movement bilaterally, CTAB.  7. RRR, No Gallops, Rubs or Murmurs, No Parasternal Heave.  8. Positive Bowel Sounds, Abdomen Soft, ruq  tender,   No rebound -guarding or rigidity.  9. No Cyanosis, Normal Skin Turgor, No Skin Rash or Bruise.  10. Good muscle tone, joints appear normal , no effusions, Normal ROM.  11. No Palpable Lymph Nodes in Neck or Axillae    Data Review  CBC    Lab 02/07/13 1129   WBC 4.83   HGB 13.8   HCT 41.2   PLT 248   MCV 94.1   MCH 31.5   MCHC 33.5   RDW 13   MONOABS --   EOSABS --   BASOSABS --      ------------------------------------------------------------------------------------------------------------------  Chemistries     Lab  02/07/13 1129   NA 136   K 4.1   CL 102   CO2 25   BUN 16.0   MG --   AST 22   ALT 32   ALKPHOS 91       Coagulation profile  No results found for this basename: INR:5,PROTIME:5 in the last 168 hours  ------------------------------------------------------------------------------  Cardiac Enzymes  Results for Patricia, [name] (MRN [id number]) as of 02/07/2013 16:21   Ref. Range 02/07/2013 11:29   Troponin I Latest Range: 0.00-0.09 ng/mL <0.01   Creatine Kinase (CK) Latest Range: 29-168 U/L 51     Imaging results  :   Ct Angio Chest    02/07/2013  Clinical History: Chest pain. Rule out pulmonary embolism.  Technique: Thin section images were obtained through the chest following intravenous administration of contrast. Chest CTA was performed per ACR protocol and MIP images were performed. 100 mL of Visipaque 320 contrast was administered.  Findings: No pulmonary embolism is identified. However, evaluation for peripheral emboli is limited due to suboptimal pulmonary artery enhancement. No pathologically enlarged mediastinal, hilar, or axillary lymph nodes. No pleural or pericardial effusion. Small calcified left lower lobe granuloma. Mild scattered bilateral subpleural linear scarring and/or atelectasis. No focal consolidation or discrete pulmonary nodule. Multilevel degenerative changes in the spine. 2.5 cm nodule in the right thyroid lobe. Small hiatal hernia. Small calcified hepatic granuloma.      02/07/2013  Impression: 1. No evidence of pulmonary embolism. However, evaluation for peripheral emboli is limited due to suboptimal pulmonary artery enhancement. 2. 2.5 cm dominant nodule in the right thyroid lobe. Recommend thyroid ultrasound.  Darra Lis, MD  02/07/2013 1:25 PM     Chest Ap Portable    02/07/2013  Clinical History: Chest pain.  Findings: AP view of the chest. No prior  studies are available for comparison. Mild tortuosity of the thoracic aorta. Cardiac silhouette is mildly prominent but magnified by AP technique. Pulmonary vascularity is normal. Mild left basilar linear scarring or atelectasis. No focal consolidation. Old fracture of the right posterior sixth rib.      02/07/2013  Impression: Prominent heart and mild left basilar scarring or atelectasis.   Darra Lis, MD  02/07/2013 12:29 PM     My personal review of EKG:   NORMAL SINUS RHYTHM  INFERIOR MYOCARDIAL INFARCTION (CITED ON OR BEFORE 13-Dec-2011)  ANTEROLATERAL MYOCARDIAL INFARCTION (CITED ON OR BEFORE 13-Dec-2011)  ABNORMAL ECG  WHEN COMPARED WITH ECG OF 13-Dec-2011 07:07,QUESTIONABLE CHANGE IN INITIAL FORCES OF INFERIOR LEADS  Personally reviewed Old Chart from 09/01/10  CLINICAL HISTORY- Crohn's disease.   FINDINGS- No small bowel wall thickening, fold thickening, stricture, or dilatation is identified. No focal lesions in solid abdominal visceral organs. No hydronephrosis or biliary ductal dilatation. Gallbladder is grossly normal. Magnetic susceptibility artifact along the anterior abdominal wall related to prior surgery. No abdominal/pelvic lymphadenopathy, ascites, or abdominal aortic aneurysm.   IMPRESSION- Unremarkable study.       Assessment & Plan  Chest pain r/o ACS:  RUQ tenderness  Hyperglycemia  right thyroid lobe  Crohn's dse  Anxiety  Rheumatoid arthritis. She is currently on Humira every 2 weeks.  Osteoarthritis.   Plan:  amit OBS; R/O ACS; CT chest negative  Consult cardiology ; check echo  NPO after MN  Check RUQ [place]; increase ppi to bid  TSH, free T4; Thyroid US  A1c ,lipids    ?  DVT Prophylaxis  SCD'S  AM Labs Ordered, also please review Full Orders  Admission, patients condition and plan of care including tests being ordered have been discussed with the patient AND HER HUSBAND who indicate understanding and agree with the plan and Code Status.  Code Status FULL  Condition GUARDED  Anselmo Pickler M.D on 02/07/2013 at 4:20 PM

## 2013-02-08 ENCOUNTER — Observation Stay: Payer: Commercial Managed Care - HMO

## 2013-02-08 LAB — BASIC METABOLIC PANEL
Anion Gap: 10 (ref 5.0–15.0)
BUN: 16.5 mg/dL (ref 7.0–19.0)
CO2: 28 mEq/L (ref 22–29)
Calcium: 9 mg/dL (ref 8.5–10.5)
Chloride: 101 mEq/L (ref 98–107)
Creatinine: 0.8 mg/dL (ref 0.6–1.0)
Glucose: 157 mg/dL — ABNORMAL HIGH (ref 70–100)
Potassium: 3.9 mEq/L (ref 3.5–5.1)
Sodium: 139 mEq/L (ref 136–145)

## 2013-02-08 LAB — CBC
Hematocrit: 37.9 % (ref 37.0–47.0)
Hgb: 12.2 g/dL (ref 12.0–16.0)
MCH: 30.7 pg (ref 28.0–32.0)
MCHC: 32.2 g/dL (ref 32.0–36.0)
MCV: 95.2 fL (ref 80.0–100.0)
MPV: 9.2 fL — ABNORMAL LOW (ref 9.4–12.3)
Platelets: 208 10*3/uL (ref 140–400)
RBC: 3.98 10*6/uL — ABNORMAL LOW (ref 4.20–5.40)
RDW: 13 % (ref 12–15)
WBC: 5.43 10*3/uL (ref 3.50–10.80)

## 2013-02-08 LAB — TROPONIN I
Troponin I: <0.01 ng/mL (ref 0.00–0.09)
Troponin I: <0.01 ng/mL (ref 0.00–0.09)

## 2013-02-08 LAB — LIPID PANEL
Cholesterol / HDL Ratio: 5.6 Index
Cholesterol: 228 mg/dL — ABNORMAL HIGH (ref 0–199)
HDL: 41 mg/dL (ref >40)
LDL Calculated: 131 mg/dL — ABNORMAL HIGH (ref 0–99)
Triglycerides: 281 mg/dL — ABNORMAL HIGH (ref 34–149)
VLDL Calculated: 56 mg/dL — ABNORMAL HIGH (ref 10–40)

## 2013-02-08 LAB — ECG 12-LEAD
Atrial Rate: 88 {beats}/min
P Axis: 35 degrees
P-R Interval: 184 ms
Q-T Interval: 348 ms
QRS Duration: 74 ms
QTC Calculation (Bezet): 421 ms
R Axis: -18 degrees
T Axis: 18 degrees
Ventricular Rate: 88 {beats}/min

## 2013-02-08 LAB — HEMOLYSIS INDEX
Hemolysis Index: 7 Index (ref 0–9)
Hemolysis Index: 4 Index (ref 0–9)

## 2013-02-08 LAB — CK
Creatine Kinase (CK): 36 U/L (ref 29–168)
Creatine Kinase (CK): 29 U/L (ref 29–168)

## 2013-02-08 LAB — PT/INR
PT INR: 0.9
PT: 12.3 s — ABNORMAL LOW (ref 12.6–15.0)

## 2013-02-08 LAB — T4, FREE: T4 Free: 0.89 ng/dL (ref 0.70–1.48)

## 2013-02-08 LAB — GFR: EGFR: >60

## 2013-02-08 MED ORDER — RANITIDINE HCL 75 MG PO TABS
300.0000 mg | ORAL_TABLET | Freq: Every evening | ORAL | Status: DC
Start: 2013-02-08 — End: 2013-02-08

## 2013-02-08 MED ORDER — ASPIRIN 81 MG PO TBEC
81.0000 mg | DELAYED_RELEASE_TABLET | Freq: Every day | ORAL | Status: DC
Start: 2013-02-08 — End: 2014-02-12

## 2013-02-08 MED ORDER — RANITIDINE HCL 150 MG PO TABS
300.0000 mg | ORAL_TABLET | Freq: Every evening | ORAL | Status: AC
Start: 2013-02-08 — End: ?

## 2013-02-08 NOTE — Consults (Signed)
[facility] CONSULTATION REPORT  [facility]    Date Time: 02/08/2013 8:05 AM  Patient Name: Patricia [name],Patricia [name]  Requesting Physician: Anselmo Pickler, MD       Reason for Consultation:   cp      History:   Patricia [name] is a 62 y.o. female admitted on 02/07/2013, for whom we are asked to provide cardiac consultation, regarding cp   Known to [place] w/cv risks and a chronically abn EKG and reassuring eval 2009-2010 now presents w/cp x 20-64mins.  She c/o clower central sharp cp w/"electric current" bilat chest and some left arm yest packing bag for her dog in prep for a trip [place]  Nothing seemed to make it better or worse, assoc w/anxiety and sweating.  No prior or recurrence. Enzymes neg and no acute EKG changes.  No chf s/s or palp.    Past Medical History:     Past Medical History   Diagnosis Date   . Hypertensive disorder    . Hyperlipidemia    . Sleep apnea      RESOLVED   . Crohn's disease 1993   . Hiatal hernia    . GERD (gastroesophageal reflux disease)    . Kidney stone    . Rheumatoid arthritis(714.0)    . Diabetes      pt states borderline       Past Surgical History:     Past Surgical History   Procedure Date   . Tummy tuck 2010   . Breast bx left  1994   . Basal cell mohs 2008   . Cysto-kidney stone 12/2011   . Egd/colonoscopy 2011   . Ovarian cyst removal    . Bilateral salpingoophorectomy 04/2012       Family History:     Family History   Problem Relation Age of Onset   . Kidney failure Mother    . Esophageal cancer Father        Social History:     History     Social History   . Marital Status: Married     Spouse Name: N/A     Number of Children: N/A   . Years of Education: N/A     Social History Main Topics   . Smoking status: Former Smoker -- 1.0 packs/day for 4 years     Quit date: 04/04/1978   . Smokeless tobacco: Never Used   . Alcohol Use: 1.2 oz/week     2 Glasses of wine per week      Comment: 2 glasses wine per night   . Drug Use: No   . Sexually Active:      Other Topics  Concern   . Not on file     Social History Narrative   . No narrative on file       Allergies:     Allergies   Allergen Reactions   . Cefuroxime Other (See Comments)     DOESN'T INTERACT W/ MEDS   . Asacol (Mesalamine)    . Other      6MP(chemo drug);and adhesive  Tape   . Sulfa Antibiotics Rash       Medications:     Prescriptions prior to admission   Medication Sig   . adalimumab (HUMIRA) 40 MG/0.8ML injection Inject 40 mg into the skin every 14 (fourteen) days.    . benazepril (LOTENSIN) 20 MG tablet Take 20 mg by mouth every morning.   . bimatoprost (LATISSE) 0.03 %  ophthalmic solution Place into both eyes nightly. Place one drop on applicator and apply evenly along the skin of the upper eyelid at base of eyelashes once daily at bedtime; repeat procedure for second eye (use a clean applicator).   . Calcium Carbonate-Vitamin D (CALTRATE 600+D) 600-400 MG-UNIT per tablet Take 2 tablets by mouth daily.   . carvedilol (COREG) 12.5 MG tablet Take 12.5 mg by mouth 2 (two) times daily with meals.   . clonAZEpam (KLONOPIN) 1 MG tablet Take 1 mg by mouth nightly.   . Cranberry 250 MG CAPS Take by mouth 2 (two) times daily.   . Cyanocobalamin (VITAMIN B-12 IJ) Inject 1,000 mcg as directed every 30 (thirty) days.   . DULoxetine (CYMBALTA) 60 MG capsule Take 60 mg by mouth 2 (two) times daily.   . ergocalciferol (ERGOCALCIFEROL) 50000 UNITS capsule Take 50,000 Units by mouth once a week.   . hydrochlorothiazide (HYDRODIURIL) 25 MG tablet Take 25 mg by mouth every morning.   . lansoprazole (PREVACID) 30 MG capsule Take 30 mg by mouth every morning.   . Multiple Vitamin (MULTIVITAMIN) tablet Take 1 tablet by mouth daily.   . potassium chloride (K-DUR) 10 MEQ tablet Take 10 mEq by mouth nightly.   . pregabalin (LYRICA) 75 MG capsule Take 75 mg by mouth 2 (two) times daily.   . Tapentadol HCl (NUCYNTA ER) 150 MG TB12 Take by mouth 2 (two) times daily.   . Vitamins-Lipotropics (LIPO-FLAVONOID PLUS) TABS Take 1 capsule by mouth 3  (three) times daily.   Marland Kitchen zolpidem (AMBIEN) 10 MG tablet Take 10 mg by mouth nightly as needed.       Current Facility-Administered Medications   Medication Dose Route Frequency Provider Last Rate Last Dose   . 0.9%  NaCl infusion   Intravenous Continuous Anselmo Pickler, MD 75 mL/hr at 02/07/13 2208     . [COMPLETED] acetaminophen (TYLENOL) tablet 1,000 mg  1,000 mg Oral Once Herma Ard, MD   1,000 mg at 02/07/13 1444   . acetaminophen (TYLENOL) tablet 650 mg  650 mg Oral Q4H PRN Anselmo Pickler, MD       . [COMPLETED] aspirin chewable tablet 324 mg  324 mg Oral Once [name], MD   324 mg at 02/07/13 1157   . aspirin chewable tablet 324 mg  324 mg Oral Daily Anselmo Pickler, MD       . carvedilol (COREG) tablet 12.5 mg  12.5 mg Oral BID MEALS Anselmo Pickler, MD   12.5 mg at 02/07/13 1722   . clonazePAM (KlonoPIN) tablet 1 mg  1 mg Oral QHS Anselmo Pickler, MD   1 mg at 02/07/13 2208   . DULoxetine (CYMBALTA) DR capsule 60 mg  60 mg Oral BID Anselmo Pickler, MD   60 mg at 02/07/13 1821   . hydrochlorothiazide (HYDRODIURIL) tablet 25 mg  25 mg Oral QAM Anselmo Pickler, MD       . HYDROmorphone (DILAUDID) injection 1 mg  1 mg Intravenous Q4H PRN Anselmo Pickler, MD   1 mg at 02/07/13 2349   . [COMPLETED] iodixanol (VISIPAQUE) 320 MG/ML injection 100 mL  100 mL Intravenous ONCE PRN Potluri, Laroy Apple, MD   100 mL at 02/07/13 1300   . lisinopril (PRINIVIL,ZESTRIL) tablet 20 mg  20 mg Oral Daily Anselmo Pickler, MD       . [COMPLETED] nitroglycerin (NITRO-BID) 2 % oint ointment 1 inch  1 inch Topical Once [name], MD  1 inch at 02/07/13 1157   . nitroglycerin (NITROSTAT) SL tablet 0.4 mg  0.4 mg Sublingual Q5 Min PRN Anselmo Pickler, MD       . ondansetron [facility]) injection 4 mg  4 mg Intravenous Q4H PRN Anselmo Pickler, MD       . pantoprazole (PROTONIX) EC tablet 40 mg  40 mg Oral BID AC Anselmo Pickler, MD       . PATIENT SUPPLIED NON FORMULARY 1  drop  1 drop Topical QHS Anselmo Pickler, MD       . pregabalin (LYRICA) capsule 75 mg  75 mg Oral BID Anselmo Pickler, MD   75 mg at 02/07/13 1722   . zolpidem (AMBIEN) tablet 10 mg  10 mg Oral QHS Anselmo Pickler, MD   10 mg at 02/07/13 2208   . [DISCONTINUED] morphine injection 2 mg  2 mg Intravenous Q2H PRN Anselmo Pickler, MD   2 mg at 02/07/13 1726         Review of Systems:    Comprehensive review of systems including constitutional, eyes, ears, nose, mouth, throat, cardiovascular, GI, GU, musculoskeletal, integumentary, respiratory, neurologic, psychiatric, and endocrine is negative other than what is mentioned already in the history of present illness    Physical Exam:     Filed Vitals:    02/08/13 0613   BP: 165/77   Pulse: 88   Temp: 97.4 F (36.3 C)   Resp: 18   SpO2: 95%     Temp (24hrs), Avg:97.4 F (36.3 C), Min:96.8 F (36 C), Max:98.2 F (36.8 C)      Intake and Output Summary (Last 24 hours) at Date Time    Intake/Output Summary (Last 24 hours) at 02/08/13 0805  Last data filed at 02/08/13 0700   Gross per 24 hour   Intake    600 ml   Output      2 ml   Net    598 ml       GENERAL: Patient is in no acute distress   HEENT: No scleral icterus or conjunctival pallor, moist mucous membranes   NECK: No jugular venous distention or thyromegaly, normal carotid upstrokes without bruits   CARDIAC: Normal apical impulse, regular rate and rhythm, with normal S1 and S2, and no murmurs, rubs, or gallops   CHEST: Clear to auscultation bilaterally, normal respiratory effort  ABDOMEN: No abdominal bruits, masses, or hepatosplenomegaly, nontender, non-distended, good bowel sounds   EXTREMITIES: No clubbing, cyanosis, or edema, 2+ DP, PT, and femoral pulses bilaterally without bruits  SKIN: No rash or jaundice   NEUROLOGIC: Alert and oriented to time, place and person, normal mood and affect  MUSCULOSKELETAL: Normal muscle strength and tone.      Labs Reviewed:       Lab 02/08/13 0653 02/08/13 0042  02/07/13 1826 02/07/13 1129   CK 29 36 39 --   TROPI <0.01 <0.01 -- <0.01   TROPT -- -- -- --   CKMBINDEX -- -- -- --     No results found for this basename: DIG in the last 168 hours  No results found for this basename: CHOL:3,TRIG:3,HDL:3,LDL:3 in the last 168 hours    Lab 02/07/13 1129   BILITOTAL 0.6   BILIDIRECT --   PROT 6.8   ALB 3.2*   ALT 32   AST 22     No results found for this basename: MG in the last 168 hours    Lab 02/08/13  1610   PT 12.3*   INR 0.9   PTT --       Lab 02/08/13 0653 02/07/13 1129   WBC 5.43 4.83   HGB 12.2 13.8   HCT 37.9 41.2   PLT 208 248       Lab 02/08/13 0653 02/07/13 1129   NA 139 136   K 3.9 4.1   CL 101 102   CO2 28 25   BUN 16.5 16.0   CREAT 0.8 0.8   EGFR >60.0 >60.0   GLU 157* 256*   CA 9.0 9.5     EKG: NSR, inf q waves, dec ant forces (?old ASMI) w/late precordial transition-prob NSC from prior tracing    Radiology   Radiological Procedure reviewed.   Chest CTA IMPRESSION:   Impression:   1. No evidence of pulmonary embolism. However, evaluation for peripheral emboli is limited due to suboptimal pulmonary artery enhancement.   2. 2.5 cm dominant nodule in the right thyroid lobe. Recommend thyroid   ultrasound.    Chest X-ray IMPRESSION:   Impression: Prominent heart and mild left basilar scarring or   atelectasis    Assessment:    1-*CP-mostly non-cardiac, non-effort s/s and doubt this is ischemic despite cv risks   2-HTN/?HCVD   3-Chronically abn EKG   A. Echo 10/2008 w/nml EF   B. Nml MPI 10/2008, EF 68%   4-Crohn's   5-HLD   6-Sulfa-hives   7-pre-DM Rx lifestyle   8-Obesity   9-OSA w/CPAP non-compliance/?intolerance    Recommendations:    1-R/o MI   2-TLC   3-Ambulate and if ok, can d/c for out pt eval   4-Ischemic eval-outpt  lexiscan MPI   5-out pt echo for hcvd   6-wt loss-she is interested (and her husband) in the Ideal Protein Diet   7-Rx OSA- I have asked that she go back to the sleep center to refit CPAP            Signed by: Lamount Cranker,  MD      [facility]  NP Spectralink [phone] (8am-5pm)  MD Spectralink [phone] (8am-5pm)  After hours, non urgent consult line [phone]  After Hours, urgent consults [phone]

## 2013-02-08 NOTE — Discharge Instructions (Signed)
Discharge Instructions        Follow with Primary MD Lavetta Nielsen, MD in 7 days     Get CBC, CMP, checked in 7 days by Primary MD and again as instructed by your Primary MD. Get Medicines reviewed and adjusted.    Please request your Prim.MD to go over all Hospital Tests and Procedure/Radiological results at the follow up, please get all Hospital records sent to your Prim MD by signing hospital release before you go home.    Follow with Dr  Delane Ginger, [place] In 2 weeks [id number]  [place] [address]  [phone]  Lamount Cranker In 1 week [address]  [address] [place] [address]  [phone]  Harless Nakayama, MD In 1 week [address]  [address]  [place] [address]  [phone]      Activity: Fall precautions use walker/cane & assistance as needed      Do not drive when taking Pain medications.     Wear Seat belts while driving.    Diet: cardiac,  Fluid restriction 1.8 lit/day, Aspiration precautions.      Disposition home    If you experience worsening of your admission symptoms, develop shortness of breath, life threatening emergency, suicidal or homicidal thoughts you must seek medical attention immediately by calling 911 or calling your MD immediately  if symptoms less severe.    Do not take more than prescribed Pain, Sleep and Anxiety Medications    Special Instructions: If you have smoked or chewed Tobacco  in the last 2 yrs please stop smoking, stop any regular Alcohol  and or any Recreational drug use.    You Must read complete instructions/literature along with all the possible adverse reactions/side effects for all the Medicines you take and that have been prescribed to you. Take any new Medicines after you have completely understood and accepted all the possible adverse reactions/side effects.

## 2013-02-08 NOTE — Discharge Summary (Signed)
Patricia [name], 62 y.o., DOB July 07, 1951, MRN [id number].  Admission date: 02/07/2013  Discharge Date 02/08/2013  Primary MD Lavetta Nielsen, MD  Admitting Physician Brynda Rim, MD  Admission Diagnosis   Thyroid nodule [241.0]  Hyperglycemia [790.29]  Chest pain with moderate risk for cardiac etiology [786.50]  5409811 Chest [id number]   Discharge Diagnosis  Principal Problem:   *Chest pain  Active Problems:   Hyperglycemia   Thyroid nodule   RUQ abdominal tenderness    Hospital Course   See H&P, Labs, Consult and Test reports for all details in brief, patient admitting  cp  w/cv risks and a chronically abn EKG and reassuring eval 2009-2010 now prsents w/cp x 20-72mins. She c/o [facility] cp w/"electric current" bilat chest and some left arm yest packing bag for her dog in prep for a trip [place] Nothing seemed to make it better or worse, assoc w/anxiety and sweating. No prior or recurrence. Enzymes neg and no acute EKG changes. No chf s/s or palp  Chest pain r/o ACS/RUQ tenderness :likey GERD ; added zantac to ppi ; recommended outpt f/u with GI  Seen by cardiology; ruled out MI; outpt stress terst; CT chest negative   Hyperglycemia :diet; outpt f/u  right thyroid lobe nodules:reffered to endicrinology'; TSH, free T4 WNL  Crohn's dse   Anxiety   Rheumatoid arthritis. She is currently on Humira every 2 weeks.   Osteoarthritis.     ?  Consults  cardiology   Significant Tests:  Ct Angio Chest    02/07/2013  Clinical History: Chest pain. Rule out pulmonary embolism.  Technique: Thin section images were obtained through the chest following intravenous administration of contrast. Chest CTA was performed per ACR protocol and MIP images were performed. 100 mL of Visipaque 320 contrast was administered.  Findings: No pulmonary embolism is identified. However, evaluation for peripheral emboli is limited due to suboptimal pulmonary artery enhancement. No pathologically enlarged mediastinal, hilar, or axillary  lymph nodes. No pleural or pericardial effusion. Small calcified left lower lobe granuloma. Mild scattered bilateral subpleural linear scarring and/or atelectasis. No focal consolidation or discrete pulmonary nodule. Multilevel degenerative changes in the spine. 2.5 cm nodule in the right thyroid lobe. Small hiatal hernia. Small calcified hepatic granuloma.      02/07/2013  Impression: 1. No evidence of pulmonary embolism. However, evaluation for peripheral emboli is limited due to suboptimal pulmonary artery enhancement. 2. 2.5 cm dominant nodule in the right thyroid lobe. Recommend thyroid ultrasound.  Darra Lis, MD  02/07/2013 1:25 PM     [place] Head Neck Soft Tissue    02/07/2013  History: Thyroid nodule.  FINDINGS: Ultrasound of the thyroid gland was performed. The right lobe measures 4.5 x 2.4 x 2.1 cm and the left lobe 3.8 x 1.4 x 1.1 cm. There is a homogeneous solid nodule in the right lower pole measuring 2.4 x 2.1 x 2.6 cm. There is a solid heterogeneous nodule in the right upper pole measuring 1.5 x 1.5 x 1.4 cm. No nodules are seen in the left lobe.      02/07/2013   There are 2 solid nodules in the right lobe of the thyroid gland as described above.  Lanier Ensign, MD  02/07/2013 6:53 PM     US Abdomen Limited Ruq    02/07/2013  History: Right upper quadrant pain.  FINDINGS: Ultrasound of the right upper quadrant was performed.  The gallbladder is normal with no evidence of gallstones, gallbladder wall thickening,  or pericholecystic fluid. The patient was not tender to transducer compression over the gallbladder. The intrahepatic and extrahepatic bile ducts are not dilated. The common duct measures 5 mm in diameter. The liver shows mildly increased echogenicity compatible with fatty infiltration. The pancreas is partially obscured by bowel gas, but the visualized portions are unremarkable. The right kidney shows normal size and echotexture with no evidence for hydronephrosis. The abdominal aorta and IVC are  unremarkable. No ascites is present.      02/07/2013   1. No evidence of cholelithiasis or cholecystitis. 2. Mild fatty infiltration of the liver.  Lanier Ensign, MD  02/07/2013 6:28 PM     Chest Ap Portable    02/07/2013  Clinical History: Chest pain.  Findings: AP view of the chest. No prior studies are available for comparison. Mild tortuosity of the thoracic aorta. Cardiac silhouette is mildly prominent but magnified by AP technique. Pulmonary vascularity is normal. Mild left basilar linear scarring or atelectasis. No focal consolidation. Old fracture of the right posterior sixth rib.      02/07/2013  Impression: Prominent heart and mild left basilar scarring or atelectasis.   Darra Lis, MD  02/07/2013 12:29 PM     ?  Today   Subjective:   Lucie Leather today has no headache,no chest abdominal pain,no new weakness tingling or numbness, feels much better wants to go home today.   Objective:   Blood pressure 137/66, pulse 84, temperature 96.5 F (35.8 C), temperature source Temporal Artery, resp. rate 16, height 1.676 m (5\' 6" ), weight 93.2 kg (205 lb 7.5 oz), SpO2 95.00%.    Intake/Output Summary (Last 24 hours) at 02/08/13 1110  Last data filed at 02/08/13 0700   Gross per 24 hour   Intake    600 ml   Output      2 ml   Net    598 ml     Exam  Awake Alert, Oriented *3, No new F.N deficits, Normal affect  NC.AT,PERRAL  Supple Neck,No JVD, No cervical lymphadenopathy appriciated.   Symmetrical Chest wall movement, Good air movement bilaterally, CTAB  RRR,No Gallops,Rubs or new Murmurs, No Parasternal Heave  +ve B.Sounds, Abd Soft, Non tender, No organomegaly appriciated, No rebound -guarding or rigidity.  No Cyanosis, Clubbing or edema, No new Rash or bruise  Data Review     CBC w Diff: Lab Results   Component Value Date    WBC 5.43 02/08/2013    HGB 12.2 02/08/2013    HGB 13.0 01/01/2012    HCT 37.9 02/08/2013    PLT 208 02/08/2013    PLT 174 01/01/2012    LYMPHOPCT 29.2 01/01/2012    MONOPCT 13.6 01/01/2012    EOSPCT 1.6  01/01/2012     CMP: Lab Results   Component Value Date    NA 139 02/08/2013    K 3.9 02/08/2013    CL 101 02/08/2013    CO2 28 02/08/2013    BUN 16.5 02/08/2013    GLU 157* 02/08/2013    PROT 6.8 02/07/2013    PROT 6.2* 01/01/2012    CA 9.0 02/08/2013    ALKPHOS 91 02/07/2013    AST 22 02/07/2013    ALT 32 02/07/2013   .  ?  Discharge Instructions  Follow with Primary MD Lavetta Nielsen, MD in 7 days     Get CBC, CMP, checked in 7 days by Primary MD and again as instructed by your Primary MD. Get Medicines reviewed and adjusted.  Please request your Prim.MD to go over all Hospital Tests and Procedure/Radiological results at the follow up, please get all Hospital records sent to your Prim MD by signing hospital release before you go home.    Follow with Dr  Delane Ginger, [place] In 2 weeks [id number]  [place] [address]  [phone]  Lamount Cranker In 1 week [address]  [address] [place] [address]  [phone]  Harless Nakayama, MD In 1 week [address]  [place] [address]  [phone]      Activity: Fall precautions use walker/cane & assistance as needed      Do not drive when taking Pain medications.     Wear Seat belts while driving.    Diet: cardiac,  Fluid restriction 1.8 lit/day, Aspiration precautions.      Disposition home    If you experience worsening of your admission symptoms, develop shortness of breath, life threatening emergency, suicidal or homicidal thoughts you must seek medical attention immediately by calling 911 or calling your MD immediately  if symptoms less severe.    Do not take more than prescribed Pain, Sleep and Anxiety Medications    Special Instructions: If you have smoked or chewed Tobacco  in the last 2 yrs please stop smoking, stop any regular Alcohol  and or any Recreational drug use.    You Must read complete instructions/literature along with all the possible adverse reactions/side effects for all the Medicines you take and that have been prescribed to you. Take any new Medicines after  you have completely understood and accepted all the possible adverse reactions/side effects.       Discharge Medications   Please see medication reconciliation orders  ?  Total Time in preparing paper work, data evaluation and todays exam - 35 minutes  Anselmo Pickler M.D on 02/08/2013 at 11:10 AM

## 2013-02-09 LAB — HEMOGLOBIN A1C: Hemoglobin A1C: 7.3 % — ABNORMAL HIGH (ref 0.0–6.0)

## 2013-03-05 DIAGNOSIS — T148XXA Other injury of unspecified body region, initial encounter: Secondary | ICD-10-CM

## 2013-03-05 HISTORY — DX: Other injury of unspecified body region, initial encounter: T14.8XXA

## 2013-03-12 ENCOUNTER — Emergency Department: Payer: Commercial Managed Care - HMO

## 2013-03-12 ENCOUNTER — Emergency Department
Admission: EM | Admit: 2013-03-12 | Discharge: 2013-03-12 | Disposition: A | Discharge status: Home / Self Care | Payer: Commercial Managed Care - HMO | Attending: Emergency Medicine | Admitting: Emergency Medicine

## 2013-03-12 DIAGNOSIS — K5090 Crohn's disease, unspecified, without complications: Secondary | ICD-10-CM | POA: Insufficient documentation

## 2013-03-12 DIAGNOSIS — S52601A Unspecified fracture of lower end of right ulna, initial encounter for closed fracture: Secondary | ICD-10-CM

## 2013-03-12 DIAGNOSIS — S8990XA Unspecified injury of unspecified lower leg, initial encounter: Secondary | ICD-10-CM | POA: Insufficient documentation

## 2013-03-12 DIAGNOSIS — I10 Essential (primary) hypertension: Secondary | ICD-10-CM | POA: Insufficient documentation

## 2013-03-12 DIAGNOSIS — S52609A Unspecified fracture of lower end of unspecified ulna, initial encounter for closed fracture: Secondary | ICD-10-CM | POA: Insufficient documentation

## 2013-03-12 DIAGNOSIS — Y92410 Unspecified street and highway as the place of occurrence of the external cause: Secondary | ICD-10-CM | POA: Insufficient documentation

## 2013-03-12 DIAGNOSIS — E785 Hyperlipidemia, unspecified: Secondary | ICD-10-CM | POA: Insufficient documentation

## 2013-03-12 DIAGNOSIS — R7309 Other abnormal glucose: Secondary | ICD-10-CM | POA: Insufficient documentation

## 2013-03-12 DIAGNOSIS — S8991XA Unspecified injury of right lower leg, initial encounter: Secondary | ICD-10-CM

## 2013-03-12 DIAGNOSIS — Y93K1 Activity, walking an animal: Secondary | ICD-10-CM | POA: Insufficient documentation

## 2013-03-12 DIAGNOSIS — Z7982 Long term (current) use of aspirin: Secondary | ICD-10-CM | POA: Insufficient documentation

## 2013-03-12 DIAGNOSIS — S52509A Unspecified fracture of the lower end of unspecified radius, initial encounter for closed fracture: Secondary | ICD-10-CM | POA: Insufficient documentation

## 2013-03-12 DIAGNOSIS — IMO0002 Reserved for concepts with insufficient information to code with codable children: Secondary | ICD-10-CM | POA: Insufficient documentation

## 2013-03-12 DIAGNOSIS — K219 Gastro-esophageal reflux disease without esophagitis: Secondary | ICD-10-CM | POA: Insufficient documentation

## 2013-03-12 DIAGNOSIS — W010XXA Fall on same level from slipping, tripping and stumbling without subsequent striking against object, initial encounter: Secondary | ICD-10-CM | POA: Insufficient documentation

## 2013-03-12 DIAGNOSIS — S99919A Unspecified injury of unspecified ankle, initial encounter: Secondary | ICD-10-CM | POA: Insufficient documentation

## 2013-03-12 DIAGNOSIS — S60511A Abrasion of right hand, initial encounter: Secondary | ICD-10-CM

## 2013-03-12 DIAGNOSIS — M159 Polyosteoarthritis, unspecified: Secondary | ICD-10-CM | POA: Insufficient documentation

## 2013-03-12 MED ORDER — KETOROLAC TROMETHAMINE 30 MG/ML IJ SOLN
30.0000 mg | Freq: Once | INTRAMUSCULAR | Status: AC
Start: 2013-03-12 — End: 2013-03-12
  Administered 2013-03-12: 30 mg via INTRAVENOUS
  Filled 2013-03-12: qty 1

## 2013-03-12 MED ORDER — HYDROMORPHONE HCL PF 1 MG/ML IJ SOLN
1.0000 mg | Freq: Once | INTRAMUSCULAR | Status: AC
Start: 2013-03-12 — End: 2013-03-12
  Administered 2013-03-12: 1 mg via INTRAVENOUS
  Filled 2013-03-12: qty 1

## 2013-03-12 MED ORDER — HYDROMORPHONE HCL PF 1 MG/ML IJ SOLN
1.00 mg | Freq: Once | INTRAMUSCULAR | Status: AC
Start: 2013-03-12 — End: 2013-03-12
  Administered 2013-03-12: 1 mg via INTRAVENOUS
  Filled 2013-03-12: qty 1

## 2013-03-12 MED ORDER — ONDANSETRON HCL 4 MG/2ML IJ SOLN
4.0000 mg | Freq: Once | INTRAMUSCULAR | Status: AC
Start: 2013-03-12 — End: 2013-03-12
  Administered 2013-03-12: 4 mg via INTRAVENOUS
  Filled 2013-03-12: qty 2

## 2013-03-12 MED ORDER — ETOMIDATE 2 MG/ML IV SOLN
12.0000 mg | Freq: Once | INTRAVENOUS | Status: AC
Start: 2013-03-12 — End: 2013-03-12
  Administered 2013-03-12: 12 mg via INTRAVENOUS
  Filled 2013-03-12: qty 1

## 2013-03-12 MED ORDER — DOXYCYCLINE MONOHYDRATE 100 MG PO CAPS
100.0000 mg | ORAL_CAPSULE | Freq: Once | ORAL | Status: AC
Start: 2013-03-12 — End: 2013-03-12
  Administered 2013-03-12: 100 mg via ORAL
  Filled 2013-03-12: qty 1

## 2013-03-12 MED ORDER — DOXYCYCLINE HYCLATE 100 MG PO TABS
100.0000 mg | ORAL_TABLET | Freq: Two times a day (BID) | ORAL | Status: DC
Start: 2013-03-12 — End: 2014-02-12

## 2013-03-12 MED ORDER — HYDROMORPHONE HCL 2 MG PO TABS
4.0000 mg | ORAL_TABLET | Freq: Four times a day (QID) | ORAL | Status: DC | PRN
Start: 2013-03-12 — End: 2014-02-12

## 2013-03-12 NOTE — ED Notes (Signed)
Patient fell onto pavement while walking her dogs striking her right knee and injuring her right wrist. Obvious deformity is noted, positive circulation, movement and sensation distal to area of injury. Abrasion and swelling is noted to the right knee as well

## 2013-03-12 NOTE — ED Provider Notes (Signed)
Physician/Midlevel provider first contact with patient: 03/12/13 1722         History     Chief Complaint   Patient presents with   . Wrist Injury     HPI Comments: Pt, with spouse, c/o fall onto outstretched right wrist x dop.  She states she was walking her dog and tripped over uneven sidewalk falling forward onto outstretched right hand.  She had immediate pain to the right wrist with a superficial epidermal abrasion to the dorsum of the right hand.  She also c/o right knee injury.  Pt able to wb/amb.  Her last meal was 1300 hrs.  Per pt dT is current.  Denies head injury, neck/back pain, swelling, numbness, paresthesia, anemia, blood d/o,     Patient is a 62 y.o. female presenting with wrist injury. The history is provided by the patient. No language interpreter was used.   Wrist Injury   The incident occurred less than 1 hour ago. The incident occurred in the street. The injury mechanism was a fall. The pain is present in the right wrist (right knee). The quality of the pain is described as throbbing and sharp. The pain is severe. The pain has been constant since the incident. Pertinent negatives include no fever and no malaise/fatigue. The symptoms are aggravated by palpation, use and movement. She has tried nothing for the symptoms. The treatment provided no relief.       Past Medical History   Diagnosis Date   . Hypertensive disorder    . Hyperlipidemia    . Sleep apnea      RESOLVED   . Crohn's disease 1993   . Hiatal hernia    . GERD (gastroesophageal reflux disease)    . Kidney stone    . Rheumatoid arthritis(714.0)    . Diabetes      pt states borderline       Past Surgical History   Procedure Date   . Tummy tuck 2010   . Breast bx left  1994   . Basal cell mohs 2008   . Cysto-kidney stone 12/2011   . Egd/colonoscopy 2011   . Ovarian cyst removal    . Bilateral salpingoophorectomy 04/2012       Family History   Problem Relation Age of Onset   . Kidney failure Mother    . Esophageal cancer Father         Social  History   Substance Use Topics   . Smoking status: Former Smoker -- 1.0 packs/day for 4 years     Quit date: 04/04/1978   . Smokeless tobacco: Never Used   . Alcohol Use: 0.0 oz/week      Comment: social       .     Allergies   Allergen Reactions   . Cefuroxime Other (See Comments)     DOESN'T INTERACT W/ MEDS   . Asacol (Mesalamine)    . Other      6MP(chemo drug);and adhesive  Tape   . Sulfa Antibiotics Rash       Current/Home Medications    ADALIMUMAB (HUMIRA) 40 MG/0.8ML INJECTION    Inject 40 mg into the skin every 14 (fourteen) days.     ASPIRIN EC 81 MG EC TABLET    Take 1 tablet (81 mg total) by mouth daily.    BENAZEPRIL (LOTENSIN) 20 MG TABLET    Take 20 mg by mouth every morning.    BIMATOPROST (LATISSE) 0.03 % OPHTHALMIC SOLUTION  Place into both eyes nightly. Place one drop on applicator and apply evenly along the skin of the upper eyelid at base of eyelashes once daily at bedtime; repeat procedure for second eye (use a clean applicator).    CALCIUM CARBONATE-VITAMIN D (CALTRATE 600+D) 600-400 MG-UNIT PER TABLET    Take 2 tablets by mouth daily.    CARVEDILOL (COREG) 12.5 MG TABLET    Take 12.5 mg by mouth 2 (two) times daily with meals.    CLONAZEPAM (KLONOPIN) 1 MG TABLET    Take 1 mg by mouth nightly.    CRANBERRY 250 MG CAPS    Take by mouth 2 (two) times daily.    CYANOCOBALAMIN (VITAMIN B-12 IJ)    Inject 1,000 mcg as directed every 30 (thirty) days.    DULOXETINE (CYMBALTA) 60 MG CAPSULE    Take 60 mg by mouth 2 (two) times daily.    ERGOCALCIFEROL (ERGOCALCIFEROL) 50000 UNITS CAPSULE    Take 50,000 Units by mouth once a week.    HYDROCHLOROTHIAZIDE (HYDRODIURIL) 25 MG TABLET    Take 25 mg by mouth every morning.    LANSOPRAZOLE (PREVACID) 30 MG CAPSULE    Take 30 mg by mouth every morning.    MULTIPLE VITAMIN (MULTIVITAMIN) TABLET    Take 1 tablet by mouth daily.    POTASSIUM CHLORIDE (K-DUR) 10 MEQ TABLET    Take 10 mEq by mouth nightly.    PREGABALIN (LYRICA) 75 MG CAPSULE     Take 75 mg by mouth 2 (two) times daily.    RANITIDINE (ZANTAC) 150 MG TABLET    Take 2 tablets (300 mg total) by mouth nightly.    TAPENTADOL HCL (NUCYNTA ER) 150 MG TB12    Take by mouth 2 (two) times daily.    VITAMINS-LIPOTROPICS (LIPO-FLAVONOID PLUS) TABS    Take 1 capsule by mouth 3 (three) times daily.    ZOLPIDEM (AMBIEN) 10 MG TABLET    Take 10 mg by mouth nightly as needed.        Review of Systems   Constitutional: Positive for activity change (decreased). Negative for fever, chills, malaise/fatigue and diaphoresis.   HENT: Negative for nosebleeds, neck pain and neck stiffness.    Eyes: Negative for pain, discharge and redness.   Respiratory: Negative for cough, chest tightness and shortness of breath.    Cardiovascular: Negative for chest pain and palpitations.   Gastrointestinal: Negative for nausea, vomiting and abdominal pain.   Genitourinary: Negative for dysuria, urgency and frequency.   Musculoskeletal: Positive for joint swelling (right wrist) and arthralgias (right wrist and knee). Negative for myalgias, back pain and gait problem.   Skin: Positive for wound (superficial epidermal abrasion to dorsum right hand ). Negative for color change and rash.   Neurological: Negative for dizziness, syncope, weakness, light-headedness, numbness and headaches.   Hematological: Does not bruise/bleed easily.   Psychiatric/Behavioral: Negative for confusion, decreased concentration and agitation. The patient is not nervous/anxious.    All other systems reviewed and are negative.        Physical Exam    BP 138/70  Pulse 91  Temp 98.7 F (37.1 C)  Resp 16  Wt 90.719 kg  SpO2 95%    Physical Exam   Nursing note and vitals reviewed.  Constitutional: She is oriented to person, place, and time. She appears well-developed and well-nourished. She appears distressed.   HENT:   Head: Normocephalic and atraumatic.   Right Ear: External ear normal.   Left Ear: External ear  normal.   Nose: Nose normal.   Mouth/Throat:  Oropharynx is clear and moist. No oropharyngeal exudate.   Eyes: Conjunctivae normal are normal. Pupils are equal, round, and reactive to light. Right conjunctiva is not injected. No scleral icterus.   Neck: Normal range of motion and full passive range of motion without pain. Neck supple. No spinous process tenderness and no muscular tenderness present. Normal range of motion present. No thyromegaly present.   Cardiovascular: Regular rhythm, normal heart sounds and intact distal pulses.  Tachycardia present.    No murmur heard.  Pulses:       Radial pulses are 2+ on the right side, and 2+ on the left side.   Pulmonary/Chest: Effort normal and breath sounds normal. No respiratory distress. She has no wheezes. She has no rales.   Abdominal: Soft. Bowel sounds are normal. She exhibits no distension. There is no tenderness.   Musculoskeletal: She exhibits edema and tenderness.        Right elbow: She exhibits normal range of motion, no swelling, no effusion, no deformity and no laceration. no tenderness found.        Right wrist: She exhibits decreased range of motion, bony tenderness, swelling and deformity.        Right hip: She exhibits normal range of motion, normal strength, no tenderness, no bony tenderness, no swelling, no crepitus, no deformity and no laceration.        Right knee: She exhibits bony tenderness. She exhibits normal range of motion, no swelling, no effusion, no ecchymosis, no deformity, no laceration, no erythema, normal alignment, no LCL laxity, normal patellar mobility and no MCL laxity. no tenderness found. No medial joint line, no lateral joint line, no MCL, no LCL and no patellar tendon tenderness noted.        Right ankle: She exhibits normal range of motion, no swelling, no ecchymosis, no deformity, no laceration and normal pulse. no tenderness.        Right forearm: She exhibits tenderness, bony tenderness (distal radius and ulna), swelling and deformity. She exhibits no laceration.         Arms:       Right hand: She exhibits normal range of motion, no tenderness, no bony tenderness, normal two-point discrimination, normal capillary refill, no deformity, no laceration and no swelling. normal sensation noted. Normal strength noted.        Legs:       Left foot: She exhibits normal range of motion, no tenderness, no bony tenderness, no swelling, normal capillary refill, no crepitus, no deformity and no laceration.        Feet:         Deg changes at multiple PIPJ/DIPJ's   Lymphadenopathy:     She has no cervical adenopathy.   Neurological: She is alert and oriented to person, place, and time. No cranial nerve deficit or sensory deficit. She exhibits normal muscle tone. Coordination and gait normal. GCS eye subscore is 4. GCS verbal subscore is 5. GCS motor subscore is 6.        Right hand dom.  Grip L>R   Skin: Skin is warm and dry. Abrasion noted. No ecchymosis, no laceration, no lesion and no rash noted. She is not diaphoretic. No erythema.   Psychiatric: She has a normal mood and affect. Her speech is normal and behavior is normal. Judgment and thought content normal. Cognition and memory are normal.       MDM and ED Course  ED Medication Orders      Start     Status Ordering Provider    03/12/13 2125   HYDROmorphone (DILAUDID) injection 1 mg   Once      Route: Intravenous  Ordered Dose: 1 mg         Ordered Chelsei Mcchesney A    03/12/13 2120   doxycycline (MONODOX) capsule 100 mg   Once      Route: Oral  Ordered Dose: 100 mg         Ordered Paxon Propes A    03/12/13 2027   HYDROmorphone (DILAUDID) injection 1 mg   Once      Route: Intravenous  Ordered Dose: 1 mg         Last MAR action:  Given Jairen Goldfarb A    03/12/13 2002   ketorolac (TORADOL) injection 30 mg   Once      Route: Intravenous  Ordered Dose: 30 mg         Last MAR action:  Given Cayleen Benjamin A    03/12/13 1916   etomidate (AMIDATE) injection 12 mg   Once      Route: Intravenous  Ordered Dose: 12 mg         Last MAR action:   Given POTLURI, JAGADISH    03/12/13 1838   HYDROmorphone (DILAUDID) injection 1 mg   Once      Route: Intravenous  Ordered Dose: 1 mg         Last MAR action:  Given Christene Pounds A    03/12/13 1728   HYDROmorphone (DILAUDID) injection 1 mg   Once      Route: Intravenous  Ordered Dose: 1 mg         Last MAR action:  Given Danelle Curiale A    03/12/13 1728   ondansetron (ZOFRAN) injection 4 mg   Once      Route: Intravenous  Ordered Dose: 4 mg         Last MAR action:  Given Amire Leazer A                 MDM  Number of Diagnoses or Management Options  Degenerative joint disease involving multiple joints:   Fall from slip, trip, or stumble, initial encounter: new and requires workup  Hand abrasion, right, initial encounter: new and requires workup  Knee injury, right, initial encounter: new and requires workup  Radius and ulna distal fracture, right, closed, initial encounter: new and requires workup  Diagnosis management comments: The attending signature signifies review and agreement of the history , PE, evaluation, clinical impression and discharge plan except as otherwise noted.    I,Connelly [place], PA-C, have been the primary provider for Thelma Comp during this Emergency Dept visit.    Oxygen saturation by pulse oximetry is 95%-100%, Normal.  Interventions: None Needed.    DDX to include, but not limited to:  Fx, sprain, contusion, abrasion, DJD, doubt dslxn  Plan:  Iv, analgesia, po abx, xr, procedural sedation, closed reduction, supportive care, ortho consult and f/u         Amount and/or Complexity of Data Reviewed  Tests in the radiology section of CPT: reviewed and ordered (Right wrist xr:  Distal radius and ulnar styloid fx    Post-reduction right forearm xr:  Improved displaced fx.  Intra-articular distal radius fx.  Unchanged ulnar styloid fx.  Deg changes about radiocarpal joint and proximal ulna.)  Review and summarize past medical records:  yes  Discuss the patient with other providers: yes  (Case reviewed with ED attending, J Potlouri, who examined pt and agrees with the current ED course, tx and d/c plan      1830 d/w private ortho Dr Welton Flakes who reviewed xr and agrees with the current ED course and tx plan.  Will f/u in office tomorrow if pt needs, but will still need to be seen in 4 days in office for recheck.)  Independent visualization of images, tracings, or specimens: yes    Risk of Complications, Morbidity, and/or Mortality  Presenting problems: high  Diagnostic procedures: moderate  Management options: high    Patient Progress  Patient progress: stable        Orthopedic Injury  Date/Time: 03/12/2013 7:41 PM  Performed by: Rogene Houston, Domenique Southers A  Authorized by: [name]  Consent: Verbal consent obtained.  Risks and benefits: risks, benefits and alternatives were discussed  Consent given by: patient  Patient understanding: patient states understanding of the procedure being performed  Required items: required blood products, implants, devices, and special equipment available  Patient identity confirmed: verbally with patient and arm band  Time out: Immediately prior to procedure a "time out" was called to verify the correct patient, procedure, equipment, support staff and site/side marked as required.  Injury location: wrist  Location details: right wrist  Injury type: fracture  Fracture type: distal radius and ulnar styloid  Pre-procedure distal perfusion: normal  Pre-procedure neurological function: diminished  Pre-procedure range of motion: reduced  Local anesthesia used: no  Patient sedated: please see Dr Potlouri's sedation note.  Manipulation performed: yes  Skeletal traction used: yes  Reduction successful: yes  X-ray confirmed reduction: yes  Immobilization: splint  Splint type: sugar tong  Supplies used: Ortho-Glass (sling)  Post-procedure neurovascular assessment: post-procedure neurovascularly intact  Post-procedure distal perfusion: normal  Post-procedure neurological function:  normal  Post-procedure range of motion: improved  Patient tolerance: Patient tolerated the procedure well with no immediate complications.    Splint Application  Date/Time: 03/12/2013 7:50 PM  Performed by: Rogene Houston, Raychelle Hudman A  Authorized by: [name]  Consent: Verbal consent obtained.  Risks and benefits: risks, benefits and alternatives were discussed  Consent given by: patient  Patient understanding: patient states understanding of the procedure being performed  Required items: required blood products, implants, devices, and special equipment available  Patient identity confirmed: verbally with patient and arm band  Time out: Immediately prior to procedure a "time out" was called to verify the correct patient, procedure, equipment, support staff and site/side marked as required.  Location details: right wrist  Splint type: sugar tong  Supplies used: cotton padding, elastic bandage and Ortho-Glass  Post-procedure: The splinted body part was neurovascularly unchanged following the procedure.  Patient tolerance: Patient tolerated the procedure well with no immediate complications.      Results     ** No Results found for the last 24 hours. **          Radiology Results (24 Hour)     Procedure Component Value Units Date/Time    Forearm Complete Right [[id number]] Collected:03/12/13 2046    Order Status:Completed  Updated:03/12/13 2054    Narrative:    CLINICAL HISTORY: Post reduction of distal radial fracture.    FINDINGS: PA and lateral views of the right forearm were obtained.  Comparison 03/12/2013 at 1801 hours. Osseous structures are partially  obscured by an overlying splint. There is a fracture involving the  distal radius with fracture extension into the  radiocarpal joint. There  has been interval reduction in the degree of fracture displacement and  angulation. There is a mild degree of remaining volar displacement of  the distal radial fragment with widening of the radiocarpal joint  volarly. In  addition, there is a mildly displaced fracture of the ulnar  styloid, not significantly changed. No new fractures are identified.  There is no dislocation. The elbow joint is normal. Degenerative changes  are again noted at the first carpometacarpal joint.      Impression:      1. Partial interval reduction of distal radial fracture.  2. Ulnar styloid fracture, no significant interval change.    Collene Schlichter, MD   03/12/2013 8:49 PM    Wrist Right PA Lateral And Oblique [[id number]] Collected:03/12/13 1855    Order Status:Completed  Updated:03/12/13 1901    Narrative:    Clinical history: Fall, right wrist pain.    FINDINGS: AP and lateral views of the right wrist were obtained. There  is a displaced fracture involving the distal radius with associated  foreshortening and volar angulation. The distal radial fragment is  displaced volarly approximately 8 mm and radially approximately 12 mm.  There is a mildly displaced fracture of the ulnar styloid. No other  fractures are identified. There is no dislocation. There is moderate  joint space narrowing involving the first carpometacarpal joint,  consistent with degenerative change. Soft tissue swelling is noted.      Impression:     Fractures involving the distal radius and ulnar styloid.    Collene Schlichter, MD   03/12/2013 6:57 PM    Knee Right AP and Lateral [[id number]] Collected:03/12/13 1838    Order Status:Completed  Updated:03/12/13 1844    Narrative:    CLINICAL HISTORY: Right knee pain status post injury.    FINDINGS: AP and lateral views of the right knee were obtained. There is  no fracture or dislocation. There is a mild degree of joint space  narrowing involving the medial femorotibial compartment consistent with  degenerative change. The lateral femorotibial and patellofemoral  compartments are normal. There is spurring of the tibial spines. No  significant bone lesions are identified. There is a small knee joint  effusion.      Impression:      1. No  evidence for acute osseous abnormality.  2. Mild degenerative joint disease involving the medial femorotibial  compartment.  3. Small knee joint effusion.    Collene Schlichter, MD   03/12/2013 6:40 PM          I have reviewed all labs and/or radiological studies. I have reviewed all xrays if any myself on the PACS system.  Clinical Impression & Disposition     Clinical Impression  Final diagnoses:   Radius and ulna distal fracture, right, closed, initial encounter   Hand abrasion, right, initial encounter   Knee injury, right, initial encounter   Degenerative joint disease involving multiple joints   Fall from slip, trip, or stumble, initial encounter        ED Disposition     Discharge [facility] discharge to home/self care.    Condition at discharge: Good             New Prescriptions    DOXYCYCLINE (VIBRA-TABS) 100 MG TABLET    Take 1 tablet (100 mg total) by mouth 2 (two) times daily.    HYDROMORPHONE (DILAUDID) 2 MG TABLET    Take 2 tablets (4 mg total) by  mouth every 6 (six) hours as needed for Pain (As needed for pain).               Shamarie Call, Selinda Flavin, [place]  03/12/13 2136

## 2013-03-12 NOTE — Discharge Instructions (Signed)
Fall Prevention (Edu)    You have requested information on Fall Prevention.    According to a 2003 study from the Journal of the [employer], more than 1.8 million adults, aged 64 and older, were treated in emergency departments for fall-related injuries. More than 421,000 were hospitalized. The most common injuries from a fall are head injuries that in turn cause a brain injury, and fractures (broken bones). Of all types of broken bones that happen from falls, hip fractures are the most serious and lead to the greatest number of health problems and deaths.    To make your living area safer, older adults should consider the following:   Improve lighting throughout the home. Use night-lights to help you see at night.   Have handrails installed on both sides of stairways.   Have grab bars placed next to the toilet and in the shower. Also consider an elevated toilet seat and a shower chair.   Use non-slip bath mats in the tub or shower.   Remove "throw rugs" to prevent tripping.   Avoid the use of long robes to prevent tripping.   Wear well-fitted shoes or slippers. Wearing loose footwear can cause you to shuffle, and make you more likely to trip and fall. Inexpensive anti-slip socks can also be purchased.   Be sure to keep all electrical cords and small objects out of the pathway.   If a cane, walker or any other assistive device is used, be sure to have them inspected regularly. The devices must be used correctly to prevent injuries.   Remember to move about at a pace that is comfortable for your ability. For example, do not rush to answer the doorbell or the phone. Take your time.    In recent studies, a number of risk factors have been identified that make older adults more likely to have falls. It has also been shown that when these risk factors are modified, it will help to prevent falls.   Exercise: Regular physical activity or exercise increases body strength and improves  balance.   Medication Review: Follow up with your doctor and pharmacist as needed to review your medications and any recent changes that may have been made. They can tell you if there are drug interactions and side-effects. If you are taking any sedatives or sleeping pills, it might be possible to have the dosage decreased, or have the number of medications reduced. These kinds of medications can cause drowsiness and dizziness, thus posing a risk of falling.   Vision Checks: Follow up with an eye doctor at least once a year to have your vision checked.      Degenerative Joint Disease    You have been diagnosed with degenerative joint disease (DJD).    Healthy joints have cartilage or soft bone that works like a cushion. When the cartilage wears down, DJD develops.     Symptoms of DJD include clicking or grinding in the joint or pain that gets worse when the joint is used. DJD can occur anywhere but it is most common in the knees, hips and shoulders.     Taking medication as prescribed will help control your pain.    YOU SHOULD SEEK MEDICAL ATTENTION IMMEDIATELY, EITHER HERE OR AT THE NEAREST EMERGENCY DEPARTMENT, IF ANY OF THE FOLLOWING OCCURS:   The joint looks swollen or red.   Your joint hurts too much to use. (For example, if your knee hurts too much to walk on it).  You have a temperature over 100.5 F, or shaking chills.      Abrasion    You have been diagnosed with an abrasion, a scrape of the outer skin layers.    Remove old dressings daily and apply a clean, dry dressing. If the dressing sticks to the wound, slightly moisten it with water. This will allow it to come off more easily.    Keep the wound clean and dry for the next 24 hours. You can wash the wound gently with soap and water, then apply a dry bandage if necessary to protect the wound.    Apply a thin layer of antibiotic ointment (such as Polysporin / triple antibiotic) to the wound 2-3 times a day. This should help to prevent  infection and may help keep scarring to a minimum.    YOU SHOULD SEEK MEDICAL ATTENTION IMMEDIATELY, EITHER HERE OR AT THE NEAREST EMERGENCY DEPARTMENT, IF ANY OF THE FOLLOWING OCCURS:   Unusual redness or swelling.   Red streaks starting up the arm or leg.   Foul drainage or odor from wound.   Fever, chills, increasing pain and / or swelling.      Both Bone Forearm Fracture    You have been diagnosed with a "forearm fracture."    You have a fracture of both the radius and the ulna, the 2 bones that make up the forearm. When standing with your palm facing forward, the ulna bone is on the little finger side of the forearm, and the radius is on the thumb side. This fracture can also be called either a Colles or "reverse Colles fracture, depending on the direction the broken parts are pushed.    A fracture is a break in a bone. It means the same thing as saying a "broken bone." In general, fractures heal over about 6-8 weeks. The broken bone will eventually become stronger at the site of the break than in the surrounding bone. The usual treatment of a fracture is with the use of a splint or cast. If you are placed in a splint today, it will provide good immobilization for the injured area, it will most likely be converted to a cast when you visit the orthopedic (bone) doctor. Although most fractures can be managed by placing them in a splint or cast, some may need surgery to achieve the best possible alignment and correction of the fracture. This will be determined in consultation with the orthopedic doctor.    The general care of a fracture includes the use of a medication to reduce pain, the use of a splint/cast to reduce movement, and Resting, Icing, Compressing and Elevating the injured area. Remember this as "RICE."   REST: Limit the use of the injured body part.   ICE: By applying ice to the affected area, swelling and pain can be reduced. Place some ice cubes in a re-sealable (Ziploc) bag and add some  water. Put a thin washcloth between the bag and the skin. Apply the ice bag to the area for at least 20 minutes. Do this at least 4 times per day. Using the ice for longer times and more frequently is OK. NEVER APPLY ICE DIRECTLY TO THE SKIN.   COMPRESS: Compression means to apply pressure around the injured area such as with a splint, cast or an ace bandage. Compression decreases swelling and improves comfort. Compression should be tight enough to relieve swelling but not so tight as to decrease circulation. Increasing pain, numbness, tingling, or change  in skin color, are all signs of decreased circulation.   ELEVATE: Elevate the injured part. For example, a fractured arm can be elevated by placing the arm in a sling while awake or by propping it up on pillows while lying down.    You have been given a splint for your fracture to decrease pain and to help keep the injured area immobilized. You should use the splint until your follow-up appointment with your doctor or the referral orthopedic (bone) doctor.    Use the following SPLINT CARE INSTRUCTIONS and do the following frequently throughout the day:   Check capillary refill (circulation) in the nail beds by pressing on the nail bed and then releasing the nail bed. It should turn white when you press on it and then promptly return to pink in less than 2 seconds after you let go.   Watch for swelling of the area beyond the splint.   If the skin of the hand or fingers is excessively cold, pale or numb to the touch, the splint may be too tight. The wrap holding the splint in place can be loosened or you can return here or go to the nearest Emergency Department to have it adjusted.    YOU SHOULD SEEK MEDICAL ATTENTION IMMEDIATELY, EITHER HERE OR AT THE NEAREST EMERGENCY DEPARTMENT, IF ANY OF THE FOLLOWING OCCURS:   You experience a severe increase in pain or swelling in the affected area.   You develop new numbness and tingling in or below the affected  area.   You develop a cold, pale hand that appears to have a problem with its blood supply.      Rest, ice, elevation.   Wear splint and use sling for comfort until recheck with ortho.  Tylenol or ibuprofen for pain, fever.   DO NOT TAKE TYLENOL WITH NARCOTIC PAIN MEDS.  Activity as tolerated.

## 2013-03-12 NOTE — ED Provider Notes (Signed)
I, Patricia [name], have personally seen and examined this patient, and have fully participated in their care.  I agree with all pertinent and available clinical information, including history, physical examination, assessment, clinical impression, and plan as documented by the APP except as noted.    Plan of care:  Patient with gross deformity of the right wrist with dorsal abrasion. No evidence of open fx. Closed reduction with procedural sedation and ortho f/u.     [facility] Emergency Procedural Sedation Note    Patient Name: Patricia [name], Patricia [name] [id number]   Procedure Date/Time:    Difficult Airway Considerations  Obesity: no  Limited cervical extension: no  History of difficult intubation: no  Short or thick neck: no  Restricted tracheal lumen: no  Written and verbal consent obtained prior to sedation: Yes    History  The patient has been NPO since 1700  History of anesthesia complications: no  Allergies   Allergen Reactions   . Cefuroxime Other (See Comments)     DOESN'T INTERACT W/ MEDS   . Asacol (Mesalamine)    . Other      6MP(chemo drug);and adhesive  Tape   . Sulfa Antibiotics Rash     Current facility-administered medications:[COMPLETED] doxycycline (MONODOX) capsule 100 mg, 100 mg, Oral, Once, Shiffert, Kent A, PA, 100 mg at 03/12/13 2137;  [COMPLETED] etomidate (AMIDATE) injection 12 mg, 12 mg, Intravenous, Once, Milliana Reddoch, MD, 12 mg at 03/12/13 1940;  [COMPLETED] HYDROmorphone (DILAUDID) injection 1 mg, 1 mg, Intravenous, Once, Shiffert, Kent A, PA, 1 mg at 03/12/13 1736  [COMPLETED] HYDROmorphone (DILAUDID) injection 1 mg, 1 mg, Intravenous, Once, Shiffert, Kent A, PA, 1 mg at 03/12/13 1851;  [COMPLETED] HYDROmorphone (DILAUDID) injection 1 mg, 1 mg, Intravenous, Once, Shiffert, Kent A, PA, 1 mg at 03/12/13 2033;  [COMPLETED] HYDROmorphone (DILAUDID) injection 1 mg, 1 mg, Intravenous, Once, Shiffert, Kent A, PA, 1 mg at 03/12/13 2137  [COMPLETED] ketorolac (TORADOL) injection 30 mg,  30 mg, Intravenous, Once, Shiffert, Kent A, PA, 30 mg at 03/12/13 2003;  [COMPLETED] ondansetron (ZOFRAN) injection 4 mg, 4 mg, Intravenous, Once, Shiffert, Kent A, PA, 4 mg at 03/12/13 1735  Current outpatient prescriptions:adalimumab (HUMIRA) 40 MG/0.8ML injection, Inject 40 mg into the skin every 14 (fourteen) days. , Disp: , Rfl: ;  aspirin EC 81 MG EC tablet, Take 1 tablet (81 mg total) by mouth daily., Disp: , Rfl: 0;  benazepril (LOTENSIN) 20 MG tablet, Take 20 mg by mouth every morning., Disp: , Rfl:   bimatoprost (LATISSE) 0.03 % ophthalmic solution, Place into both eyes nightly. Place one drop on applicator and apply evenly along the skin of the upper eyelid at base of eyelashes once daily at bedtime; repeat procedure for second eye (use a clean applicator)., Disp: , Rfl: ;  Calcium Carbonate-Vitamin D (CALTRATE 600+D) 600-400 MG-UNIT per tablet, Take 2 tablets by mouth daily., Disp: , Rfl:   carvedilol (COREG) 12.5 MG tablet, Take 12.5 mg by mouth 2 (two) times daily with meals., Disp: , Rfl: ;  clonAZEpam (KLONOPIN) 1 MG tablet, Take 1 mg by mouth nightly., Disp: , Rfl: ;  Cranberry 250 MG CAPS, Take by mouth 2 (two) times daily., Disp: , Rfl: ;  Cyanocobalamin (VITAMIN B-12 IJ), Inject 1,000 mcg as directed every 30 (thirty) days., Disp: , Rfl:   doxycycline (VIBRA-TABS) 100 MG tablet, Take 1 tablet (100 mg total) by mouth 2 (two) times daily., Disp: 13 tablet, Rfl: 0;  DULoxetine (CYMBALTA) 60 MG capsule, Take 60 mg  by mouth 2 (two) times daily., Disp: , Rfl: ;  ergocalciferol (ERGOCALCIFEROL) 50000 UNITS capsule, Take 50,000 Units by mouth once a week., Disp: , Rfl: ;  hydrochlorothiazide (HYDRODIURIL) 25 MG tablet, Take 25 mg by mouth every morning., Disp: , Rfl:   HYDROmorphone (DILAUDID) 2 MG tablet, Take 2 tablets (4 mg total) by mouth every 6 (six) hours as needed for Pain (As needed for pain)., Disp: 21 tablet, Rfl: 0;  lansoprazole (PREVACID) 30 MG capsule, Take 30 mg by mouth every morning.,  Disp: , Rfl: ;  Multiple Vitamin (MULTIVITAMIN) tablet, Take 1 tablet by mouth daily., Disp: , Rfl: ;  potassium chloride (K-DUR) 10 MEQ tablet, Take 10 mEq by mouth nightly., Disp: , Rfl:   pregabalin (LYRICA) 75 MG capsule, Take 75 mg by mouth 2 (two) times daily., Disp: , Rfl: ;  ranitidine (ZANTAC) 150 MG tablet, Take 2 tablets (300 mg total) by mouth nightly., Disp: 30 tablet, Rfl: 0;  Tapentadol HCl (NUCYNTA ER) 150 MG TB12, Take by mouth 2 (two) times daily., Disp: , Rfl: ;  Vitamins-Lipotropics (LIPO-FLAVONOID PLUS) TABS, Take 1 capsule by mouth 3 (three) times daily., Disp: , Rfl:   zolpidem (AMBIEN) 10 MG tablet, Take 10 mg by mouth nightly as needed., Disp: , Rfl:      Pre-Sedation Physical Exam  ASA Class: Class 1 - Healthy patient  Mallampati score: I (soft palate, uvula, fauces, and tonsillar pillars visible)  Immediately prior to sedation, the patient was re-assessed.  Patient Vitals for the past 24 hrs:   BP Temp Pulse Resp SpO2 Weight   03/12/13 2142 144/74 mmHg - - - - -   03/12/13 2036 138/70 mmHg - 91  16  95 % -   03/12/13 2027 145/72 mmHg - 90  18  95 % -   03/12/13 2021 149/75 mmHg - 87  18  97 % -   03/12/13 2016 156/79 mmHg - 87  16  97 % -   03/12/13 2008 166/82 mmHg - 88  20  97 % -   03/12/13 2003 166/87 mmHg - 87  20  97 % -   03/12/13 1952 155/78 mmHg - 92  18  97 % -   03/12/13 1948 180/79 mmHg - 18  16  97 % -   03/12/13 1943 163/79 mmHg - 95  16  95 % -   03/12/13 1941 165/81 mmHg - 97  16  98 % -   03/12/13 1908 144/83 mmHg - 97  16  96 % -   03/12/13 1726 - - - - - 90.719 kg   03/12/13 1723 148/95 mmHg 98.7 F (37.1 C) 101  16  96 % -     My immediate pre-sedation assessment includes the following changes from my initial assessment: none    Time Out:  Immediately prior to the procedure the necessary equipment for sedation was verified to be present, a time out was called and the patients name and planned procedure were reviewed at the bedside.    Procedure Performed:  Fracture  Reduction: distal radial ulnar fracture    Sedation Agent(s) Used: Etomidate    Duration of Sedation: I was continuously present at the bedside for less than 16 minutes following the administration of the first dose of sedation medication.    Patient was reassessed following the procedure and reached their pre-procedure baseline: Yes  Complications: none  _____________________    Deep sedation using moderate sedation guidelines single provider caveat:  In this case, there was only one Medical Staff Member available to perform the procedure and the sedation.  After careful consideration, the benefit of proceeding with one provider outweighs the risk of delaying the procedure while awaiting availability of a second provider. not applicable  Emergent Procedure NPO caveat:  In this case, due to the emergent nature of the procedure, sedation was performed sooner than the policy stated NPO criteria.  yes                 Patricia Mayotte, MD  03/12/13 2246

## 2013-08-05 DIAGNOSIS — E119 Type 2 diabetes mellitus without complications: Secondary | ICD-10-CM

## 2013-08-05 HISTORY — DX: Type 2 diabetes mellitus without complications: E11.9

## 2013-11-23 ENCOUNTER — Other Ambulatory Visit: Payer: Self-pay | Admitting: Internal Medicine

## 2013-11-23 ENCOUNTER — Ambulatory Visit
Admission: RE | Admit: 2013-11-23 | Discharge: 2013-11-23 | Disposition: A | Discharge status: Home / Self Care | Payer: Commercial Managed Care - POS | Source: Ambulatory Visit | Attending: Internal Medicine | Admitting: Internal Medicine

## 2013-11-23 DIAGNOSIS — M4716 Other spondylosis with myelopathy, lumbar region: Secondary | ICD-10-CM

## 2014-02-10 ENCOUNTER — Ambulatory Visit
Admission: RE | Admit: 2014-02-10 | Discharge: 2014-02-10 | Disposition: A | Discharge status: Home / Self Care | Payer: Commercial Managed Care - POS | Source: Ambulatory Visit | Attending: Orthopaedic Surgery of the Spine | Admitting: Orthopaedic Surgery of the Spine

## 2014-02-10 DIAGNOSIS — Z01818 Encounter for other preprocedural examination: Secondary | ICD-10-CM | POA: Insufficient documentation

## 2014-02-10 LAB — BASIC METABOLIC PANEL
BUN: 18 mg/dL (ref 6.0–20.0)
CO2: 25 mEq/L (ref 21–30)
Calcium: 9.3 mg/dL (ref 8.5–10.5)
Chloride: 101 mEq/L (ref 96–109)
Creatinine: 0.6 mg/dL (ref 0.4–1.5)
Glucose: 186 mg/dL — ABNORMAL HIGH (ref 70–100)
Potassium: 4 mEq/L (ref 3.5–5.3)
Sodium: 140 mEq/L (ref 135–146)

## 2014-02-10 LAB — PT AND APTT
PT INR: 0.8
PT: 11.3 s — ABNORMAL LOW (ref 12.6–15.0)
PTT: 24 s (ref 23–37)

## 2014-02-10 LAB — URINALYSIS
Bilirubin, UA: NEGATIVE
Blood, UA: NEGATIVE
Glucose, UA: 500 — AB
Ketones UA: NEGATIVE
Leukocyte Esterase, UA: NEGATIVE
Nitrite, UA: NEGATIVE
Protein, UR: NEGATIVE
Specific Gravity UA: 1.027 (ref 1.001–1.035)
Urine pH: 6 (ref 5.0–8.0)
Urobilinogen, UA: 0.2 (ref 0.2–2.0)

## 2014-02-10 LAB — CBC
Hematocrit: 40.5 % (ref 37.0–47.0)
Hgb: 13.3 g/dL (ref 12.0–16.0)
MCH: 29.6 pg (ref 28.0–32.0)
MCHC: 32.8 g/dL (ref 32.0–36.0)
MCV: 90 fL (ref 80.0–100.0)
MPV: 9.4 fL (ref 9.4–12.3)
Nucleated RBC: 0 /100 WBC (ref 0–1)
Platelets: 304 10*3/uL (ref 140–400)
RBC: 4.5 10*6/uL (ref 4.20–5.40)
RDW: 16 % — ABNORMAL HIGH (ref 12–15)
WBC: 8.02 10*3/uL (ref 3.50–10.80)

## 2014-02-10 LAB — GFR: EGFR: >60

## 2014-02-10 LAB — HEMOLYSIS INDEX: Hemolysis Index: 8 (ref 0–18)

## 2014-02-11 ENCOUNTER — Telehealth: Payer: Commercial Managed Care - POS

## 2014-02-12 ENCOUNTER — Other Ambulatory Visit: Payer: Self-pay

## 2014-02-12 ENCOUNTER — Ambulatory Visit: Payer: Commercial Managed Care - POS

## 2014-02-12 ENCOUNTER — Ambulatory Visit
Admission: RE | Admit: 2014-02-12 | Discharge: 2014-02-12 | Disposition: A | Discharge status: Home / Self Care | Payer: Commercial Managed Care - POS | Source: Ambulatory Visit | Attending: Orthopaedic Surgery of the Spine | Admitting: Orthopaedic Surgery of the Spine

## 2014-02-12 DIAGNOSIS — R0989 Other specified symptoms and signs involving the circulatory and respiratory systems: Secondary | ICD-10-CM

## 2014-02-12 DIAGNOSIS — Z01818 Encounter for other preprocedural examination: Secondary | ICD-10-CM | POA: Insufficient documentation

## 2014-02-12 NOTE — Pre-Procedure Instructions (Addendum)
DOS Feb 19 729 ARRIVE 0600  NOTED INCREASED GLUCOSE LEVEL-PT HAD EATEN, PT ADMITS TO BEING NONCOMPLIANT WITH HER DIET.

## 2014-02-16 NOTE — Anesthesia Preprocedure Evaluation (Addendum)
Anesthesia Plan    ASA 3     general               (Medical clearance on chart.  Echocardiogram- normal, EF 60%  Lexiscan normal perfusion/function  EKG- Old inferior Q-waves)      intravenous induction         Post Op: other  Post op pain management: per surgeon    informed consent obtained    Plan discussed with CRNA.    ECG reviewed  pertinent labs reviewed             Inpatient Anesthesia Evaluation    Patient Name: Patricia [name],Patricia [name]  Surgeon: [name], MD  Patient Age / Sex: 63 y.o. / female    Medical History:     Past Medical History   Diagnosis Date   . Hypertensive disorder    . Hyperlipidemia    . Crohn's disease 1993   . GERD (gastroesophageal reflux disease)    . Rheumatoid arthritis(714.0)    . Lumbosacral spondylosis without myelopathy    . Lumbago    . Sleep apnea      DOES NOT USE HER CPAP-PT DOES SNORE   . Type 2 diabetes mellitus, controlled 2014 OCT     BS -PT DOES NOT CK    . Kidney stone 2013     CYSTO , LITHOTRIPSY   . Hiatal hernia    . Bilateral cataracts SEPT/OCT 2014     REMOVED   . Claustrophobia      MRI   . Malignant neoplasm of skin 2008     BASAL CELL FACE-MOHS PROCEDURE   . Fracture of unspecified bones MAY 2014     RT WRIST 0ORIF       Past Surgical History   Procedure Date   . Tummy tuck 2010   . Breast bx left  1994   . Basal cell mohs 2008   . Cysto-kidney stone 12/2011   . Egd/colonoscopy 2011   . Ovarian cyst removal JUNE 2013   . Bilateral salpingoophorectomy 04/2012   . Eye surgery SEPT/OCT 2014     CATARACT REMOVED   . Fracture surgery MAY 2014     ORIF RT WRIST         Allergies:     Allergies   Allergen Reactions   . Cefuroxime Other (See Comments)     DOESN'T INTERACT W/ MEDS   . Asacol (Mesalamine)    . Other      6MP(chemo drug);and adhesive  Tape   . Sulfa Antibiotics Rash         Medications:     No current facility-administered medications for this visit.     Facility-Administered Medications Ordered in Other Visits   Medication Dose Route Frequency  Last Rate Last Dose   . [COMPLETED] buffered lidocaine (XYLOCAINE) 1 % solution 0.3 mL  0.3 mL Intradermal Once   0.3 mL at 02/19/14 0647   . lactated ringers infusion   Intravenous Continuous 20 mL/hr at 02/19/14 0646 1,000 mL at 02/19/14 0646   . oxyCODONE (OxyCONTIN) 12 hr tablet 20 mg  20 mg Oral Q12H SCH                  Prior to Admission medications    Medication Sig Start Date End Date Taking? Authorizing Provider   adalimumab (HUMIRA) 40 MG/0.8ML injection Inject 80 mg into the skin every 14 (fourteen) days. FOR ARTHRITIS & CHRON'S  last took  Feb 09 2014    [provider]   benazepril (LOTENSIN) 20 MG tablet Take 10 mg by mouth every morning.     [provider]   carvedilol (COREG) 12.5 MG tablet Take 12.5 mg by mouth 2 (two) times daily with meals.    [provider]   clonAZEpam (KLONOPIN) 1 MG tablet Take 1 mg by mouth nightly.    [provider]   Cyanocobalamin (VITAMIN B-12 IJ) Inject 1,000 mcg as directed every 30 (thirty) days. LAST INJ Feb 09 2014    [provider]   dexlansoprazole 60 MG capsule Take 60 mg by mouth every morning.    [provider]   DULoxetine (CYMBALTA) 60 MG capsule Take 30 mg by mouth 2 (two) times daily.     [provider]   ergocalciferol (ERGOCALCIFEROL) 50000 UNITS capsule Take 50,000 Units by mouth once a week. TAKE Q MONDAY    [provider]   hydrochlorothiazide (HYDRODIURIL) 25 MG tablet Take 25 mg by mouth every morning.    [provider]   HYDROmorphone (DILAUDID) 2 MG tablet Take 4 mg by mouth 4 (four) times daily. 03/12/13   Shiffert, Kent A, PA   Linagliptin (TRADJENTA) 5 MG Tab Take 1 tablet by mouth every morning.    [provider]   oxyCODONE (OXYCONTIN) 20 MG 12 hr tablet Take 20 mg by mouth 3 (three) times daily.    [provider]   potassium chloride (K-DUR) 10 MEQ tablet Take 10 mEq by mouth nightly.    [provider]   ranitidine (ZANTAC) 150 MG  tablet Take 2 tablets (300 mg total) by mouth nightly. 02/08/13   Anselmo Pickler, MD   spironolactone (ALDACTONE) 25 MG tablet Take 25 mg by mouth every morning.    [provider]   Tapentadol HCl (NUCYNTA ER) 150 MG TB12 Take 1 tablet by mouth as needed.     [provider]   zolpidem (AMBIEN) 10 MG tablet Take 10 mg by mouth nightly.     [provider]     Vitals   Temp:  [36.1 C (97 F)] 36.1 C (97 F)  Heart Rate:  [100] 100   Resp Rate:  [20] 20   BP: (171)/(96) 171/96 mmHg    Wt Readings from Last 3 Encounters:   02/19/14 89.268 kg (196 lb 12.8 oz)   02/19/14 89.268 kg (196 lb 12.8 oz)   03/12/13 90.719 kg (200 lb)     BMI (Estimated Body mass index is 30.82 kg/([name]^2) as calculated from the following:    Height as of an earlier encounter on 02/19/14: 5\' 7" (1.702 [name]).    Weight as of an earlier encounter on 02/19/14: 196 lb 12.8 oz(89.268 kg).)  Temp Readings from Last 3 Encounters:   02/19/14 36.1 C (97 F)    02/19/14 36.1 C (97 F)    03/12/13 37.1 C (98.7 F)      BP Readings from Last 3 Encounters:   02/19/14 171/96   02/19/14 171/96   03/12/13 144/74     Pulse Readings from Last 3 Encounters:   02/19/14 100   02/19/14 100   03/12/13 91           Labs:   CBC:  Lab Results   Component Value Date    WBC 8.02 02/10/2014    HGB 13.3 02/10/2014    HCT 40.5 02/10/2014    PLT 304 02/10/2014  Chemistries:  Lab Results   Component Value Date    NA 140 02/10/2014    K 4.0 02/10/2014    CL 101 02/10/2014    CO2 25 02/10/2014    BUN 18.0 02/10/2014    CREAT 0.6 02/10/2014    GLU 186* 02/10/2014    HGBA1C 7.3* 02/07/2013    CA 9.3 02/10/2014    AST 22 02/07/2013       Coags:  Lab Results   Component Value Date    PT 11.3* 02/10/2014    PTT 24 02/10/2014    INR 0.8 02/10/2014     _____________________      Signed by: Wanita Chamberlain  02/19/2014   7:02 AM

## 2014-02-18 ENCOUNTER — Ambulatory Visit
Admission: RE | Admit: 2014-02-18 | Discharge: 2014-02-18 | Disposition: A | Discharge status: Home / Self Care | Payer: Commercial Managed Care - POS | Source: Ambulatory Visit | Attending: Orthopaedic Surgery of the Spine | Admitting: Orthopaedic Surgery of the Spine

## 2014-02-18 DIAGNOSIS — M47817 Spondylosis without myelopathy or radiculopathy, lumbosacral region: Secondary | ICD-10-CM

## 2014-02-18 LAB — TYPE AND SCREEN
AB Screen Gel: NEGATIVE
ABO Rh: A POS

## 2014-02-19 ENCOUNTER — Encounter
Admission: RE | Disposition: A | Discharge status: Home Health | Payer: Self-pay | Source: Ambulatory Visit | Attending: Orthopaedic Surgery of the Spine

## 2014-02-19 ENCOUNTER — Inpatient Hospital Stay: Payer: Commercial Managed Care - POS | Admitting: Orthopaedic Surgery of the Spine

## 2014-02-19 ENCOUNTER — Inpatient Hospital Stay
Admission: RE | Admit: 2014-02-19 | Discharge: 2014-02-21 | DRG: 519 | Disposition: A | Discharge status: Home Health | Payer: Commercial Managed Care - POS | Source: Ambulatory Visit | Attending: Orthopaedic Surgery of the Spine | Admitting: Orthopaedic Surgery of the Spine

## 2014-02-19 ENCOUNTER — Encounter: Payer: Self-pay | Admitting: Anesthesiology

## 2014-02-19 ENCOUNTER — Ambulatory Visit: Payer: Commercial Managed Care - POS | Admitting: Anesthesiology

## 2014-02-19 ENCOUNTER — Observation Stay: Payer: Commercial Managed Care - POS

## 2014-02-19 ENCOUNTER — Ambulatory Visit: Payer: Self-pay

## 2014-02-19 DIAGNOSIS — K219 Gastro-esophageal reflux disease without esophagitis: Secondary | ICD-10-CM

## 2014-02-19 DIAGNOSIS — Z85828 Personal history of other malignant neoplasm of skin: Secondary | ICD-10-CM

## 2014-02-19 DIAGNOSIS — M5137 Other intervertebral disc degeneration, lumbosacral region: Secondary | ICD-10-CM

## 2014-02-19 DIAGNOSIS — M47817 Spondylosis without myelopathy or radiculopathy, lumbosacral region: Secondary | ICD-10-CM | POA: Diagnosis present

## 2014-02-19 DIAGNOSIS — K5090 Crohn's disease, unspecified, without complications: Secondary | ICD-10-CM

## 2014-02-19 DIAGNOSIS — F32A Depression, unspecified: Secondary | ICD-10-CM | POA: Diagnosis present

## 2014-02-19 DIAGNOSIS — Z87891 Personal history of nicotine dependence: Secondary | ICD-10-CM

## 2014-02-19 DIAGNOSIS — E785 Hyperlipidemia, unspecified: Secondary | ICD-10-CM | POA: Diagnosis present

## 2014-02-19 DIAGNOSIS — E119 Type 2 diabetes mellitus without complications: Secondary | ICD-10-CM

## 2014-02-19 DIAGNOSIS — Z9849 Cataract extraction status, unspecified eye: Secondary | ICD-10-CM

## 2014-02-19 DIAGNOSIS — F419 Anxiety disorder, unspecified: Secondary | ICD-10-CM | POA: Diagnosis present

## 2014-02-19 DIAGNOSIS — I959 Hypotension, unspecified: Secondary | ICD-10-CM | POA: Diagnosis not present

## 2014-02-19 DIAGNOSIS — F411 Generalized anxiety disorder: Secondary | ICD-10-CM | POA: Diagnosis present

## 2014-02-19 DIAGNOSIS — F341 Dysthymic disorder: Secondary | ICD-10-CM

## 2014-02-19 DIAGNOSIS — I10 Essential (primary) hypertension: Secondary | ICD-10-CM

## 2014-02-19 DIAGNOSIS — E559 Vitamin D deficiency, unspecified: Secondary | ICD-10-CM | POA: Diagnosis present

## 2014-02-19 DIAGNOSIS — G4730 Sleep apnea, unspecified: Secondary | ICD-10-CM | POA: Diagnosis present

## 2014-02-19 DIAGNOSIS — F3289 Other specified depressive episodes: Secondary | ICD-10-CM | POA: Diagnosis present

## 2014-02-19 DIAGNOSIS — M069 Rheumatoid arthritis, unspecified: Secondary | ICD-10-CM | POA: Diagnosis present

## 2014-02-19 DIAGNOSIS — Z87442 Personal history of urinary calculi: Secondary | ICD-10-CM

## 2014-02-19 DIAGNOSIS — M5126 Other intervertebral disc displacement, lumbar region: Principal | ICD-10-CM

## 2014-02-19 DIAGNOSIS — G894 Chronic pain syndrome: Secondary | ICD-10-CM | POA: Diagnosis present

## 2014-02-19 LAB — GLUCOSE WHOLE BLOOD - POCT
Whole Blood Glucose POCT: 165 mg/dL — ABNORMAL HIGH (ref 70–100)
Whole Blood Glucose POCT: 117 mg/dL — ABNORMAL HIGH (ref 70–100)
Whole Blood Glucose POCT: 140 mg/dL — ABNORMAL HIGH (ref 70–100)
Whole Blood Glucose POCT: 128 mg/dL — ABNORMAL HIGH (ref 70–100)

## 2014-02-19 SURGERY — LAMINECTOMY, POSTERIOR LUMBAR, DECOMPRESSION, LEVEL1
Anesthesia: Anesthesia General | Site: Spine Lumbar | Wound class: Clean

## 2014-02-19 MED ORDER — HYDROMORPHONE HCL PF 1 MG/ML IJ SOLN
INTRAMUSCULAR | Status: AC
Start: 2014-02-19 — End: 2014-02-19
  Administered 2014-02-19: 0.5 mg via INTRAVENOUS
  Filled 2014-02-19: qty 1

## 2014-02-19 MED ORDER — BUPIVACAINE-EPINEPHRINE (PF) 0.25% -1:200000 IJ SOLN
INTRAMUSCULAR | Status: DC | PRN
Start: 2014-02-19 — End: 2014-02-19
  Administered 2014-02-19: 20 mL

## 2014-02-19 MED ORDER — HYDROMORPHONE PCA 0.2 MG/ML 100 ML (OUTSOURCED)
INTRAVENOUS | Status: AC
Start: 2014-02-19 — End: 2014-02-19
  Filled 2014-02-19: qty 100

## 2014-02-19 MED ORDER — LACTATED RINGERS IV SOLN
INTRAVENOUS | Status: DC
Start: 2014-02-19 — End: 2014-02-19
  Administered 2014-02-19: 1000 mL via INTRAVENOUS

## 2014-02-19 MED ORDER — GLUCAGON HCL (RDNA) 1 MG IJ SOLR
1.0000 mg | INTRAMUSCULAR | Status: DC | PRN
Start: 2014-02-19 — End: 2014-02-21

## 2014-02-19 MED ORDER — THROMBIN 5000 UNITS EX SOLR
CUTANEOUS | Status: AC
Start: 2014-02-19 — End: ?
  Filled 2014-02-19: qty 5000

## 2014-02-19 MED ORDER — SODIUM CHLORIDE 0.9 % IV SOLN
INTRAVENOUS | Status: DC
Start: 2014-02-19 — End: 2014-02-21

## 2014-02-19 MED ORDER — DEXTROSE 50 % IV SOLN
25.0000 mL | INTRAVENOUS | Status: DC | PRN
Start: 2014-02-19 — End: 2014-02-21

## 2014-02-19 MED ORDER — ROCURONIUM BROMIDE 50 MG/5ML IV SOLN
INTRAVENOUS | Status: DC | PRN
Start: 2014-02-19 — End: 2014-02-19
  Administered 2014-02-19: 40 mg via INTRAVENOUS
  Administered 2014-02-19: 30 mg via INTRAVENOUS

## 2014-02-19 MED ORDER — MEPERIDINE HCL 25 MG/ML IJ SOLN
INTRAMUSCULAR | Status: AC
Start: 2014-02-19 — End: 2014-02-19
  Administered 2014-02-19: 12.5 mg via INTRAVENOUS
  Filled 2014-02-19: qty 1

## 2014-02-19 MED ORDER — INSULIN ASPART 100 UNIT/ML SC SOLN
1.0000 [IU] | Freq: Every evening | SUBCUTANEOUS | Status: DC | PRN
Start: 2014-02-19 — End: 2014-02-21
  Administered 2014-02-20: 1 [IU] via SUBCUTANEOUS
  Filled 2014-02-19: qty 10

## 2014-02-19 MED ORDER — CLONIDINE HCL 0.1 MG PO TABS
ORAL_TABLET | ORAL | Status: AC
Start: 2014-02-19 — End: 2014-02-19
  Administered 2014-02-19: 0.1 mg via ORAL
  Filled 2014-02-19: qty 1

## 2014-02-19 MED ORDER — PROMETHAZINE HCL 25 MG/ML IJ SOLN
6.2500 mg | Freq: Two times a day (BID) | INTRAMUSCULAR | Status: DC | PRN
Start: 2014-02-19 — End: 2014-02-20

## 2014-02-19 MED ORDER — PROPOFOL INFUSION 10 MG/ML
INTRAVENOUS | Status: DC | PRN
Start: 2014-02-19 — End: 2014-02-19
  Administered 2014-02-19: 100 ug/kg/min via INTRAVENOUS

## 2014-02-19 MED ORDER — PROMETHAZINE HCL 25 MG/ML IJ SOLN
6.2500 mg | Freq: Once | INTRAMUSCULAR | Status: DC | PRN
Start: 2014-02-19 — End: 2014-02-19

## 2014-02-19 MED ORDER — SENNOSIDES-DOCUSATE SODIUM 8.6-50 MG PO TABS
1.0000 | ORAL_TABLET | Freq: Two times a day (BID) | ORAL | Status: DC
Start: 2014-02-19 — End: 2014-02-21
  Administered 2014-02-19: 1 via ORAL
  Filled 2014-02-19 (×3): qty 1

## 2014-02-19 MED ORDER — GLUCOSE 40 % PO GEL
15.0000 g | ORAL | Status: DC | PRN
Start: 2014-02-19 — End: 2014-02-21

## 2014-02-19 MED ORDER — ROCURONIUM BROMIDE 50 MG/5ML IV SOLN
INTRAVENOUS | Status: AC
Start: 2014-02-19 — End: ?
  Filled 2014-02-19: qty 10

## 2014-02-19 MED ORDER — CLONIDINE HCL 0.1 MG PO TABS
0.1000 mg | ORAL_TABLET | Freq: Once | ORAL | Status: AC
Start: 2014-02-19 — End: 2014-02-19

## 2014-02-19 MED ORDER — PROPOFOL 10 MG/ML IV EMUL
INTRAVENOUS | Status: AC
Start: 2014-02-19 — End: ?
  Filled 2014-02-19: qty 20

## 2014-02-19 MED ORDER — BUPIVACAINE-EPINEPHRINE (PF) 0.25% -1:200000 IJ SOLN
INTRAMUSCULAR | Status: AC
Start: 2014-02-19 — End: ?
  Filled 2014-02-19: qty 30

## 2014-02-19 MED ORDER — PHENYLEPHRINE HCL 10 MG/ML IJ SOLN
INTRAMUSCULAR | Status: AC
Start: 2014-02-19 — End: ?
  Filled 2014-02-19: qty 1

## 2014-02-19 MED ORDER — PROPOFOL 10 MG/ML IV EMUL
INTRAVENOUS | Status: AC
Start: 2014-02-19 — End: ?
  Filled 2014-02-19: qty 50

## 2014-02-19 MED ORDER — HYDROMORPHONE PCA 0.2 MG/ML BOLUS FROM PCA
0.5000 mg | INTRAVENOUS | Status: DC | PRN
Start: 2014-02-19 — End: 2014-02-20

## 2014-02-19 MED ORDER — SODIUM CHLORIDE 0.9 % IJ SOLN
INTRAMUSCULAR | Status: AC
Start: 2014-02-19 — End: ?
  Filled 2014-02-19: qty 10

## 2014-02-19 MED ORDER — OXYCODONE HCL ER 10 MG PO T12A
20.0000 mg | EXTENDED_RELEASE_TABLET | Freq: Two times a day (BID) | ORAL | Status: AC
Start: 2014-02-19 — End: 2014-02-19

## 2014-02-19 MED ORDER — FENTANYL CITRATE 0.05 MG/ML IJ SOLN
INTRAMUSCULAR | Status: DC | PRN
Start: 2014-02-19 — End: 2014-02-19
  Administered 2014-02-19 (×2): 100 ug via INTRAVENOUS
  Administered 2014-02-19: 50 ug via INTRAVENOUS

## 2014-02-19 MED ORDER — ONDANSETRON HCL 4 MG/2ML IJ SOLN
4.0000 mg | Freq: Three times a day (TID) | INTRAMUSCULAR | Status: DC | PRN
Start: 2014-02-19 — End: 2014-02-19

## 2014-02-19 MED ORDER — CARVEDILOL 12.5 MG PO TABS
12.5000 mg | ORAL_TABLET | Freq: Two times a day (BID) | ORAL | Status: DC
Start: 2014-02-19 — End: 2014-02-21
  Administered 2014-02-19 – 2014-02-21 (×4): 12.5 mg via ORAL
  Filled 2014-02-19 (×5): qty 1

## 2014-02-19 MED ORDER — SODIUM CHLORIDE 0.9 % IV SOLN
1.0000 g | Freq: Two times a day (BID) | INTRAVENOUS | Status: AC
Start: 2014-02-19 — End: 2014-02-21
  Administered 2014-02-19 – 2014-02-21 (×4): 1 g via INTRAVENOUS
  Filled 2014-02-19 (×4): qty 1000

## 2014-02-19 MED ORDER — VASOPRESSIN 20 UNIT/ML IJ SOLN
INTRAMUSCULAR | Status: DC | PRN
Start: 2014-02-19 — End: 2014-02-19
  Administered 2014-02-19: 2 [IU] via INTRAVENOUS

## 2014-02-19 MED ORDER — VASOPRESSIN 20 UNIT/ML IJ SOLN
INTRAMUSCULAR | Status: AC
Start: 2014-02-19 — End: ?
  Filled 2014-02-19: qty 1

## 2014-02-19 MED ORDER — BACITRACIN 50000 UNITS IM SOLR
INTRAMUSCULAR | Status: AC
Start: 2014-02-19 — End: ?
  Filled 2014-02-19: qty 50000

## 2014-02-19 MED ORDER — MIDAZOLAM HCL 2 MG/2ML IJ SOLN
INTRAMUSCULAR | Status: DC | PRN
Start: 2014-02-19 — End: 2014-02-19
  Administered 2014-02-19: 2 mg via INTRAVENOUS

## 2014-02-19 MED ORDER — LISINOPRIL 20 MG PO TABS
20.0000 mg | ORAL_TABLET | Freq: Every day | ORAL | Status: DC
Start: 2014-02-19 — End: 2014-02-21
  Administered 2014-02-20: 20 mg via ORAL
  Filled 2014-02-19 (×2): qty 1

## 2014-02-19 MED ORDER — CEPASTAT 14.5 MG MT LOZG
1.0000 | LOZENGE | OROMUCOSAL | Status: DC | PRN
Start: 2014-02-19 — End: 2014-02-21

## 2014-02-19 MED ORDER — SPIRONOLACTONE 25 MG PO TABS
25.0000 mg | ORAL_TABLET | Freq: Every morning | ORAL | Status: DC
Start: 2014-02-20 — End: 2014-02-21
  Administered 2014-02-20 – 2014-02-21 (×2): 25 mg via ORAL
  Filled 2014-02-19 (×3): qty 1

## 2014-02-19 MED ORDER — PANTOPRAZOLE SODIUM 40 MG PO TBEC
40.0000 mg | DELAYED_RELEASE_TABLET | Freq: Two times a day (BID) | ORAL | Status: DC
Start: 2014-02-19 — End: 2014-02-21
  Administered 2014-02-19 – 2014-02-21 (×4): 40 mg via ORAL
  Filled 2014-02-19 (×5): qty 1

## 2014-02-19 MED ORDER — ZOLPIDEM TARTRATE 5 MG PO TABS
5.0000 mg | ORAL_TABLET | Freq: Every evening | ORAL | Status: DC | PRN
Start: 2014-02-19 — End: 2014-02-21
  Administered 2014-02-19 – 2014-02-20 (×2): 5 mg via ORAL
  Filled 2014-02-19 (×3): qty 1

## 2014-02-19 MED ORDER — DULOXETINE HCL 30 MG PO CPEP
30.0000 mg | ORAL_CAPSULE | Freq: Two times a day (BID) | ORAL | Status: DC
Start: 2014-02-19 — End: 2014-02-21
  Administered 2014-02-19 – 2014-02-21 (×4): 30 mg via ORAL
  Filled 2014-02-19 (×5): qty 1

## 2014-02-19 MED ORDER — LIDOCAINE HCL (PF) 2 % IJ SOLN
INTRAMUSCULAR | Status: AC
Start: 2014-02-19 — End: ?
  Filled 2014-02-19: qty 5

## 2014-02-19 MED ORDER — PROPOFOL 10 MG/ML IV EMUL
INTRAVENOUS | Status: DC | PRN
Start: 2014-02-19 — End: 2014-02-19
  Administered 2014-02-19: 150 mg via INTRAVENOUS
  Administered 2014-02-19: 50 mg via INTRAVENOUS

## 2014-02-19 MED ORDER — HYDROMORPHONE HCL PF 1 MG/ML IJ SOLN
0.5000 mg | INTRAMUSCULAR | Status: AC | PRN
Start: 2014-02-19 — End: 2014-02-19
  Administered 2014-02-19 (×4): 0.5 mg via INTRAVENOUS

## 2014-02-19 MED ORDER — EPHEDRINE SULFATE 50 MG/ML IJ SOLN
INTRAMUSCULAR | Status: DC | PRN
Start: 2014-02-19 — End: 2014-02-19
  Administered 2014-02-19 (×2): 5 mg via INTRAVENOUS
  Administered 2014-02-19 (×2): 10 mg via INTRAVENOUS
  Administered 2014-02-19: 5 mg via INTRAVENOUS

## 2014-02-19 MED ORDER — ONDANSETRON HCL 4 MG/2ML IJ SOLN
4.0000 mg | Freq: Once | INTRAMUSCULAR | Status: DC | PRN
Start: 2014-02-19 — End: 2014-02-19

## 2014-02-19 MED ORDER — INSULIN ASPART 100 UNIT/ML SC SOLN
1.0000 [IU] | Freq: Three times a day (TID) | SUBCUTANEOUS | Status: DC | PRN
Start: 2014-02-19 — End: 2014-02-21
  Administered 2014-02-21: 1 [IU] via SUBCUTANEOUS
  Filled 2014-02-19: qty 10

## 2014-02-19 MED ORDER — EPHEDRINE SULFATE 50 MG/ML IJ SOLN
INTRAMUSCULAR | Status: AC
Start: 2014-02-19 — End: ?
  Filled 2014-02-19: qty 1

## 2014-02-19 MED ORDER — MIDAZOLAM HCL 2 MG/2ML IJ SOLN
INTRAMUSCULAR | Status: AC
Start: 2014-02-19 — End: ?
  Filled 2014-02-19: qty 2

## 2014-02-19 MED ORDER — VANCOMYCIN 1000 MG IN 250 ML NS IVPB VIAL-MATE (CNR)
INTRAVENOUS | Status: AC
Start: 2014-02-19 — End: 2014-02-19
  Administered 2014-02-19: 1000 mg via INTRAVENOUS
  Filled 2014-02-19: qty 250

## 2014-02-19 MED ORDER — PHENYLEPHRINE HCL 10 MG/ML IJ SOLN
INTRAMUSCULAR | Status: DC | PRN
Start: 2014-02-19 — End: 2014-02-19
  Administered 2014-02-19: 100 ug via INTRAVENOUS
  Administered 2014-02-19 (×2): 50 ug via INTRAVENOUS

## 2014-02-19 MED ORDER — ONDANSETRON HCL 4 MG/2ML IJ SOLN
4.0000 mg | Freq: Three times a day (TID) | INTRAMUSCULAR | Status: DC | PRN
Start: 2014-02-19 — End: 2014-02-21

## 2014-02-19 MED ORDER — ONDANSETRON HCL 4 MG/2ML IJ SOLN
INTRAMUSCULAR | Status: AC
Start: 2014-02-19 — End: ?
  Filled 2014-02-19: qty 2

## 2014-02-19 MED ORDER — DIAZEPAM 5 MG PO TABS
5.0000 mg | ORAL_TABLET | Freq: Four times a day (QID) | ORAL | Status: DC | PRN
Start: 2014-02-19 — End: 2014-02-21
  Administered 2014-02-21: 5 mg via ORAL
  Filled 2014-02-19 (×2): qty 1

## 2014-02-19 MED ORDER — DIPHENHYDRAMINE HCL 50 MG/ML IJ SOLN
12.5000 mg | INTRAMUSCULAR | Status: DC | PRN
Start: 2014-02-19 — End: 2014-02-19

## 2014-02-19 MED ORDER — HYDROMORPHONE PCA 0.2 MG/ML 100 ML (OUTSOURCED)
INTRAVENOUS | Status: DC
Start: 2014-02-19 — End: 2014-02-19
  Administered 2014-02-19: 20 mg via INTRAVENOUS

## 2014-02-19 MED ORDER — GELATIN ABSORBABLE 100 EX MISC
CUTANEOUS | Status: AC
Start: 2014-02-19 — End: ?
  Filled 2014-02-19: qty 1

## 2014-02-19 MED ORDER — OXYCODONE HCL ER 10 MG PO T12A
20.0000 mg | EXTENDED_RELEASE_TABLET | Freq: Three times a day (TID) | ORAL | Status: DC
Start: 2014-02-19 — End: 2014-02-20
  Administered 2014-02-19 – 2014-02-20 (×3): 20 mg via ORAL
  Filled 2014-02-19 (×4): qty 2

## 2014-02-19 MED ORDER — VANCOMYCIN 1000 MG IN 250 ML NS IVPB VIAL-MATE (CNR)
1000.0000 mg | Freq: Once | INTRAVENOUS | Status: AC
Start: 2014-02-19 — End: 2014-02-19

## 2014-02-19 MED ORDER — FENTANYL CITRATE 0.05 MG/ML IJ SOLN
INTRAMUSCULAR | Status: AC
Start: 2014-02-19 — End: ?
  Filled 2014-02-19: qty 5

## 2014-02-19 MED ORDER — ONDANSETRON HCL 4 MG/2ML IJ SOLN
INTRAMUSCULAR | Status: DC | PRN
Start: 2014-02-19 — End: 2014-02-19
  Administered 2014-02-19: 4 mg via INTRAVENOUS

## 2014-02-19 MED ORDER — FAMOTIDINE 20 MG PO TABS
40.0000 mg | ORAL_TABLET | Freq: Every evening | ORAL | Status: DC
Start: 2014-02-19 — End: 2014-02-21
  Administered 2014-02-20: 40 mg via ORAL
  Filled 2014-02-19 (×2): qty 2

## 2014-02-19 MED ORDER — LIDOCAINE 1% BUFFERED - CNR/OUTSOURCED
0.3000 mL | Freq: Once | INTRAMUSCULAR | Status: AC
Start: 2014-02-19 — End: 2014-02-19
  Administered 2014-02-19: 0.3 mL via INTRADERMAL

## 2014-02-19 MED ORDER — HYDROMORPHONE HCL 4 MG PO TABS
8.0000 mg | ORAL_TABLET | ORAL | Status: DC | PRN
Start: 2014-02-19 — End: 2014-02-20

## 2014-02-19 MED ORDER — DIPHENHYDRAMINE HCL 50 MG/ML IJ SOLN
12.5000 mg | Freq: Four times a day (QID) | INTRAMUSCULAR | Status: DC | PRN
Start: 2014-02-19 — End: 2014-02-20

## 2014-02-19 MED ORDER — DIPHENHYDRAMINE HCL 25 MG PO CAPS
25.0000 mg | ORAL_CAPSULE | Freq: Four times a day (QID) | ORAL | Status: DC | PRN
Start: 2014-02-19 — End: 2014-02-21

## 2014-02-19 MED ORDER — LIDOCAINE HCL 2 % IJ SOLN
INTRAMUSCULAR | Status: DC | PRN
Start: 2014-02-19 — End: 2014-02-19
  Administered 2014-02-19: 25 mg
  Administered 2014-02-19: 75 mg

## 2014-02-19 MED ORDER — HYDROMORPHONE PCA 0.2 MG/ML 100 ML (OUTSOURCED)
INTRAVENOUS | Status: DC
Start: 2014-02-19 — End: 2014-02-20
  Administered 2014-02-19: 20 mg via INTRAVENOUS
  Filled 2014-02-19: qty 100

## 2014-02-19 MED ORDER — HYDROMORPHONE HCL PF 1 MG/ML IJ SOLN
0.5000 mg | INTRAMUSCULAR | Status: DC | PRN
Start: 2014-02-19 — End: 2014-02-19
  Administered 2014-02-19: 0.5 mg via INTRAVENOUS

## 2014-02-19 MED ORDER — NALOXONE HCL 0.4 MG/ML IJ SOLN
0.1000 mg | INTRAMUSCULAR | Status: DC | PRN
Start: 2014-02-19 — End: 2014-02-20

## 2014-02-19 MED ORDER — BACITRACIN 50000 UNITS IM SOLR
INTRAMUSCULAR | Status: DC | PRN
Start: 2014-02-19 — End: 2014-02-19
  Administered 2014-02-19: 50000 [IU]

## 2014-02-19 MED ORDER — OXYCODONE HCL ER 10 MG PO T12A
EXTENDED_RELEASE_TABLET | ORAL | Status: AC
Start: 2014-02-19 — End: 2014-02-19
  Administered 2014-02-19: 20 mg via ORAL
  Filled 2014-02-19: qty 2

## 2014-02-19 MED ORDER — LACTATED RINGERS IV SOLN
INTRAVENOUS | Status: DC
Start: 2014-02-19 — End: 2014-02-19

## 2014-02-19 MED ORDER — CLONAZEPAM 0.5 MG PO TABS
1.0000 mg | ORAL_TABLET | Freq: Every evening | ORAL | Status: DC
Start: 2014-02-19 — End: 2014-02-21
  Administered 2014-02-19 – 2014-02-20 (×2): 1 mg via ORAL
  Filled 2014-02-19 (×3): qty 2

## 2014-02-19 MED ORDER — ACETAMINOPHEN 325 MG PO TABS
650.0000 mg | ORAL_TABLET | ORAL | Status: DC | PRN
Start: 2014-02-19 — End: 2014-02-21
  Administered 2014-02-19 – 2014-02-21 (×8): 650 mg via ORAL
  Filled 2014-02-19 (×8): qty 2

## 2014-02-19 MED ORDER — MEPERIDINE HCL 25 MG/ML IJ SOLN
12.5000 mg | INTRAMUSCULAR | Status: DC | PRN
Start: 2014-02-19 — End: 2014-02-19
  Administered 2014-02-19: 12.5 mg via INTRAVENOUS

## 2014-02-19 MED ORDER — HYDROCHLOROTHIAZIDE 25 MG PO TABS
25.0000 mg | ORAL_TABLET | Freq: Every morning | ORAL | Status: DC
Start: 2014-02-20 — End: 2014-02-21
  Administered 2014-02-20 – 2014-02-21 (×2): 25 mg via ORAL
  Filled 2014-02-19 (×3): qty 1

## 2014-02-19 SURGICAL SUPPLY — 58 items
BAG EQP ISWITCH CLN DRY (Sleeve) ×2
BAG EQUIPMENT CLEAN DRY ISWITCH (Sleeve) ×1 IMPLANT
BLANKET WARMING L60 IN X W36 IN BAIR (Procedure Accessories) ×1 IMPLANT
BLANKET WRM PLMR BR HGR 60X36IN LF CNT (Procedure Accessories) ×2
CELL PACK VITAGEL (Procedure Accessories) IMPLANT
DRAPE 74X41IN UNIVERSAL POLY XRAY C ARM CLOSURE STRAP (Drape) ×1 IMPLANT
DRAPE EQP VLCR POLY UNV STRDRP 74X41IN (Drape) ×2 IMPLANT
ELECTRODE BLADE (Cautery) ×2 IMPLANT
FORCEP BOVIE TWEEZER DISP (Instrument) ×2 IMPLANT
GLOVE BIOGEL POLYISO SZ7.5 (Glove) ×2 IMPLANT
GLOVE SRG 7.5 BGL SSNTV LTX STRL PF BEAD (Glove) ×2
GLOVE SURGICAL 7 1/2 BIOGEL (Glove) ×1 IMPLANT
GLOVE SURGICAL 7 1/2 BIOGEL SUPER-SENSITIVE POWDER FREE BEAD CUFF (Glove) ×1 IMPLANT
GOWN SURG MICROCOOL STRL LG (Gown) ×2 IMPLANT
KIT DRAINAGE L12.5 IN ROUND 3 SPRING (Drain) IMPLANT
KIT DRAINAGE L12.5 IN ROUND 3 SPRING EVACUATOR Y CONNECTOR TUBE HOLE (Drain) IMPLANT
KIT DRN PVC 400CC RND 1/8IN 12.5IN LF (Drain)
KIT HEMOSTATIC MALLEABLE APPLICATOR FLOSEAL 13CM MATRIX 5ML (Hemostat) ×1 IMPLANT
KIT HMST MTRX 5ML 13CM FLSL MLBL APL (Hemostat) ×2 IMPLANT
KIT LUMBAR W/O INSTR WATTENM (Pack) ×2 IMPLANT
KIT PAT-CARE RADIOLUCNT WILSON (Procedure Accessories) ×2 IMPLANT
MANIFOLD NEPTUNE II 4 PORT (Procedure Accessories) ×2 IMPLANT
NEEDLE 18GA 1-1/2IN (Needles) ×2 IMPLANT
NEEDLE SPINAL BD OD22 GA L3 1/2 IN (Needles) ×1 IMPLANT
NEEDLE SPINAL L3 1/2 IN REGULAR WALL QUINCKE TIP OD22 GA BD (Needles) ×1 IMPLANT
NEEDLE SPNL PP RW BD QNCK 22GA 3.5IN LF (Needles) ×2
PATTIES SURG COTTON 1/2 X 1/2 (Sponge) ×2 IMPLANT
PILLOW DISPOSABLE STANDARD (Patient Supply) ×2 IMPLANT
SLEEVE CMPR NYL MED THG LGTH SCD EXP LF (Sleeve) ×2 IMPLANT
SLEEVE COMPRESSION NYLON MEDIUM THIGH LENGTH KENDALL ADJUSTABLE (Sleeve) ×1 IMPLANT
SOLUTION SRGPRP 74% ISPRP 0.7% IOD (Prep) ×2
SOLUTION SURGICAL PREP 26 ML DURAPREP (Prep) ×1 IMPLANT
SOLUTION SURGICAL PREP 26 ML DURAPREP 74% ISOPROPYL ALCOHOL 0.7% (Prep) ×1 IMPLANT
SPONGE GAUZE 12PLY STRL 4X4IN (Sponge) ×2 IMPLANT
STAPLER SKIN L4.1 MM X W6.5 MM 35 WIDE (Staplers) IMPLANT
STAPLER SKIN L4.1 MM X W6.5 MM 35 WIDE STAPLE CARTRIDGE APPOSE ULC (Staplers) IMPLANT
STAPLER SKN SS PLS APS U 4.1X6.5MM LF 35 (Staplers)
STOCKING CMPR NYL MED REG THG LGTH TED (Patient Supply) ×2 IMPLANT
STOCKING COMPRESSION MEDIUM REGULAR THIGH LENGTH 2 PLY HEEL POCKET TED (Patient Supply) ×1 IMPLANT
SUT VICRYL 1 CT1 36IN (Suture) ×2 IMPLANT
SUTURE ABS 0 CT1 VCL 18IN CR BRD 8 STRN (Suture) ×2
SUTURE ABS 1 CT1 VCL 18IN CR BRD 8 STRN (Suture) ×2
SUTURE ABS 2-0 CT1 VCL 18IN CR BRD 8 (Suture) ×2
SUTURE COATED VICRYL 0 CT-1 L18 IN (Suture) ×1 IMPLANT
SUTURE COATED VICRYL 1 CT-1 L18 IN (Suture) ×1 IMPLANT
SUTURE COATED VICRYL 2-0 CT-1 18IN CNTRL BRD 8 STRND UNDYED (Suture) ×1 IMPLANT
SUTURE COATED VICRYL 2-0 CT-1 L18 IN (Suture) ×1 IMPLANT
SUTURE MONOCRYL 4-0 PS2 27IN (Suture) ×2 IMPLANT
SUTURE NABSB 3-0 PS2 PRLN MTPS 18IN MFL (Suture) ×2
SUTURE PROLENE 6-0 BV-1 18IN (Suture) IMPLANT
SUTURE PROLENE BLUE 3-0 PS-2 L18 IN (Suture) ×1 IMPLANT
SUTURE PROLENE BLUE 3-0 PS-2 L18 IN MONOFILAMENT NONABSORBABLE (Suture) ×1 IMPLANT
SYRINGE IRRIGATION 60CC LF (Syringes, Needles) ×4 IMPLANT
TOOL DISSECTING L14 CM MATCH HEAD FLUTE (Burr) IMPLANT
TOOL DISSECTING L14 CM MATCH HEAD FLUTE OD3 MM MIDAS REX LEGEND (Burr) IMPLANT
TOOL DSCT LGND 3MM 14MM (Burr)
TRAY FOLEY URINE METER IC16 F (Catheter Micellaneous) ×1
TRAY FOLEY URINE METER IC16 F (Catheter Miscellaneous) ×1 IMPLANT

## 2014-02-19 NOTE — Progress Notes (Signed)
SPINE PRE-OP - PROGRESS NOTE    Date Time: 02/19/2014 7:20 AM  Patient Name: Patricia [name],Patricia [name]        Subjective:   Pt c/o of left anterior thigh pain and weakness      Objective:   Pt seen in Pre-Op Area    Motor Exam UE:     Deltoid Biceps Triceps Wrist Ext Wrist Flex Finger Ext Intrinsic Hand   Right 5 5 5 5 5 5 5    Left 5 5 5 5 5 5 5      Sensation intact B UE in all dermatomes    Motor Exam LE:     Psoas GM Quads Hamst Abd Add TA EHL Gastroc   Right 5 5 5 5 5 5 5 5 5    Left 4- 5 4 5 5 5 5 5 5      Sensation intact B LE in all dermatomes  Denies saddle anesthesia      Assessment:     Risk of Surgery Discussed.  These can include but are not limited to infection, bleeding scarring, damage to nerves and blood vessels, nerve root injury, dural tear, epidural hematoma, paralysis, continued back and leg pain, continued numbness, recurrent HNP, conus injury, risk of anesthesia, DVT, medical complications, and even death.    Post operative course reviewed.         Plan:   Plan to proceed with surgery.    Signed by: [name], [name].D.

## 2014-02-19 NOTE — Plan of Care (Signed)
Problem: Health Promotion  Goal: Knowledge - disease process  Extent of understanding conveyed about a specific disease process.   Outcome: Progressing  Intervention: Risk identification  Hx of arthritis, patient is knowledgeable   Intervention: Health education - discharge instructions  Discharge teaching started    Goal: Risk control - tobacco abuse  Actions to eliminate or reduce tobacco use.   Outcome: Completed Date Met:  02/19/14  Intervention: Smoking cessation assistance  n/a    Goal: Knowledge - health resources  Extent of understanding and conveyed about healthcare resources.   Outcome: Progressing  Intervention: Discharge planning  Possible d/c to home tomorrow; no needs at this time.       Problem: Safety  Goal: Patient will be free from injury during hospitalization  Outcome: Progressing  Intervention: Assess patient's risk for falls and implement fall prevention plan of care per policy  Moderate risk. Patient aware to call for assist s/p surgery.  Intervention: Provide and maintain safe environment  All items/bedside table within reach.  Intervention: Use appropriate transfer methods  Instructed in Log roll  Intervention: Ensure appropriate safety devices are available at the bedside  Callbell within reach  Intervention: Include patient/family/caregiver in decisions related to safety  Patient/family aware to call for assist with transfers  Intervention: Hourly rounding.  Patient instructed  Intervention: Assess for patient's risk for elopement and implement Elopement Risk plan per policy  n/a

## 2014-02-19 NOTE — Progress Notes (Signed)
Dr. Renaldo Reel here consulting with patient and spouse. Pt OOB to chair.

## 2014-02-19 NOTE — Consults (Signed)
I have directly reviewed the clinical findings, lab, imaging studies and management of this patient in detail. I have interviewed and examined the pt and agree with the documentation,  as recorded by NP Zane Herald.    CC:No chief complaint on file.        Labs:   Results     Procedure Component Value Units Date/Time    Glucose Whole Blood - POCT [[id number]]  (Abnormal) Collected:02/19/14 1351     POCT - Glucose Whole blood 117 (H) mg/dL AOZHYQM:57/84/69 6295    Glucose Whole Blood - POCT [[id number]]  (Abnormal) Collected:02/19/14 0643     POCT - Glucose Whole blood 165 (H) mg/dL NUUVOZD:66/44/03 4742            Physical:  Filed Vitals:    02/19/14 1240 02/19/14 1331 02/19/14 1339 02/19/14 1437   BP: 141/63 108/71  110/55   Pulse: 80 80  82   Temp:  97.3 F (36.3 C)  97.5 F (36.4 C)   TempSrc:  Temporal Artery  Temporal Artery   Resp: 14  16 16    Height:       Weight:       SpO2: 98% 94%  96%     Intake and Output Summary (Last 24 hours) at Date Time    Intake/Output Summary (Last 24 hours) at 02/19/14 1542  Last data filed at 02/19/14 1437   Gross per 24 hour   Intake   3000 ml   Output   1150 ml   Net   1850 ml           Assessment and plan:    Active Hospital Problems    Diagnosis   . Herniated lumbar disc without myelopathy   . Type 2 diabetes mellitus   . Anxiety and depression   . Hypertensive disorder   . Sleep apnea   . Crohn's disease   . GERD (gastroesophageal reflux disease)   . Rheumatoid arthritis(714.0)     Patient is status post lumbar laminectomy, foraminotomy and discectomy.  Plan per Dr. Blase Mess.  Continue sliding scale insulin for diabetes.  Holding Tradjenta at this time.  Continue Coreg, hydrochlorothiazide,Aldactone and lisinopril for hypertension.  Blood pressure appears to be controlled.  Will monitor.  Continue Klonopin for anxiety  On PPI for gastroesophageal reflux disease      Patricia Standish Heide Spark, MD

## 2014-02-19 NOTE — Consults (Signed)
Patricia [name] ZOX:[id number]    Patients out patient PCP is No primary provider on file.  Consult requested in the Hospital by Patricia Chiquito, MD, On 02/19/2014   Reason for consult: Medical Management of DM      Assessment & Plan:    Status post lumbar laminectomy, foraminotomy and discectomy.  L3 hemilaminectomy and discectomy, decompression L2 and L3 nerve root.  Secondary to intractable low back pain with radiculopathy.  Postop orders per Dr. Blase Mess with Spine/Ortho, including pain management, activity deep vein thrombosis prophylaxis.  Restart diet.  PT, OT per Dr. Blase Mess.    Type 2 diabetes, with hyperglycemia.  Holding Tradgenta at this time.  We will use low dose insulin sliding scale, restart by mouth medications when eating full diet.  Accu-Cheks before meals at bedtime    Hypertension, controlled.  Continue home medications.    Crohn's and rheumatoid arthritis, on immunosuppression with Humira.  Injection every 2 weeks, outpatient management.    Anxiety and depression, continue home medications.    Gastroesophageal reflux disease.  Continue home medications, or Pepcid for gastrointestinal prophylaxis.    VTE prophylaxis with SCDs or recommendations per surgery.    Plan of care has been discussed with the patient who verbalized understanding and agree with the plan of care.    Time spent 30 minutes with > 50% of time spent on counseling and coordination of care.    Thank you for the consult, we will follow the patient with you in the Hospital.      HPI:  Patricia [name] ZHY:[id number] is a 63 y.o. 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  She is seen today at the request of Dr. Blase Mess for medical management of diabetes and hypertension, status post L2-L3 laminectomy, foraminotomy,  discectomy and nerve root compression decompression.  She is seen postoperatively in the [facility].  She was somewhat tearful, complaining of back pain.  Blood pressure had been managed with clonidine.  She states she held her by mouth DM medications this morning.  Per surgery recommendation.  She has taken her blood pressure medicine.  She had preoperative cardiac clearance with [place] heart.  She denies headache, dizziness, chest pain, cough, dyspnea, nausea, abdominal pain, constipation, diarrhea, dysuria, hematuria, joint pain, fever, rash, numbness, paresthesias.   Patient is a fair historian. History obtained from patient, and my review of medical  records in EPIC.      Active Problems:   Herniated lumbar disc without myelopathy   Type 2 diabetes mellitus      Past Medical History  Past Medical History   Diagnosis Date   . Hypertensive disorder    . Hyperlipidemia    . Crohn's disease 1993   . GERD (gastroesophageal reflux disease)    . Rheumatoid arthritis(714.0)    . Lumbosacral spondylosis without myelopathy    . Lumbago    . Sleep apnea      DOES NOT USE HER CPAP-PT DOES SNORE   . Type 2 diabetes mellitus, controlled 2014 OCT     BS -PT DOES NOT CK    . Kidney stone 2013     CYSTO , LITHOTRIPSY   . Hiatal hernia    . Bilateral cataracts SEPT/OCT 2014     REMOVED   . Claustrophobia      MRI   . Malignant neoplasm of skin 2008  BASAL CELL FACE-MOHS PROCEDURE   . Fracture of unspecified bones MAY 2014     RT WRIST 0ORIF        Past Surgical History.  Past Surgical History   Procedure Date   . Tummy tuck 2010   . Breast bx left  1994   . Basal cell mohs 2008   . Cysto-kidney stone 12/2011   . Egd/colonoscopy 2011   . Ovarian cyst removal JUNE 2013   . Bilateral salpingoophorectomy 04/2012   . Eye surgery SEPT/OCT 2014     CATARACT REMOVED   . Fracture surgery MAY 2014     ORIF RT WRIST        Social History  History   Substance Use Topics   . Smoking status: Former Smoker -- 1.0 packs/day for 4 years     Quit  date: 04/04/1978   . Smokeless tobacco: Never Used   . Alcohol Use: 1.2 oz/week     2 Glasses of wine per week       Family History  Family History   Problem Relation Age of Onset   . Kidney failure Mother    . Esophageal cancer Father        Allergies.  Allergies   Allergen Reactions   . Cefuroxime Other (See Comments)     DOESN'T INTERACT W/ MEDS   . Asacol (Mesalamine)    . Other      6MP(chemo drug);and adhesive  Tape   . Sulfa Antibiotics Rash       Current Medications.  Scheduled Meds:  Current Facility-Administered Medications   Medication Dose Route Frequency   . [COMPLETED] buffered lidocaine  0.3 mL Intradermal Once   . cloNIDine  0.1 mg Oral Once   . [COMPLETED] oxyCODONE  20 mg Oral Q12H SCH   . [COMPLETED] vancomycin  1,000 mg Intravenous Once       PRN Meds:.bacitracin, diphenhydrAMINE, diphenhydrAMINE, [COMPLETED] HYDROmorphone, HYDROmorphone, HYDROmorphone PCA 0.2 mg/mL, meperidine, naloxone, ondansetron, ondansetron, promethazine, promethazine    Review of Systems     complete review of systems was done and otherwise negative        Physical Exam    Intake/Output Summary (Last 24 hours) at 02/19/14 1112  Last data filed at 02/19/14 0959   Gross per 24 hour   Intake   2400 ml   Output    100 ml   Net   2300 ml     Blood pressure 113/53, pulse 70, temperature 98.2 F (36.8 C), temperature source Temporal Artery, resp. rate 18, height 1.702 m (5\' 7" ), weight 89.268 kg (196 lb 12.8 oz), SpO2 100.00%.    General: Well developed, well nourished female in NAD.  HEENT: PERRLA, EOMI, sclerae non icteric, conjunctivae clear.  Neck: supple, FROM. No lymphadenopathy, JVD, nuchal rigidity or carotid bruit.  Heart: regular rate and rhythm. S1 S2. No murmur, gallop or rub.  Lungs: Clear to ascultation. Non labored respirations.  Abdomen: soft, non tender, non distended. Normoactive bowel sounds. No hepatojugular reflux.  Bac.  Could not assess at this time  Ext: No clubbing, cyanosis or edema. Normal muscle  strength and tone.  Neuro awake, oriented X3, non focal exam  Skin: warm, dry. Mucous membranes moist/pink. No rash.  Psych: Good eye contact.  Tearful, clear speech. Well groomed.    Data Review:  Results     Procedure Component Value Units Date/Time    Glucose Whole Blood - POCT [[id number]]  (Abnormal) Collected:02/19/14 [phone]  POCT - Glucose Whole blood 165 (H) mg/dL ZOXWRUE:45/40/98 1191       preop complete blood count: WBC 8, Hb 13, HCT 40, PLT 304    BMP: Sodium 140, potassium 4, chloride 101, CO2 25, BUN 18, creatinine 0.6, glucose 186.    INR 0.8.    EKG showing sinus rhythm, biatrial enlargement    Recent echo showing sclerotic aortic valve with normal ejection fraction.

## 2014-02-19 NOTE — Progress Notes (Signed)
Patient OOB to chair since working with PT. Ambulated in hallway this evening with walker. Spouse supportive at bedside. Patient slightly tearful after returning from walk and experiencing increased pain. PCA basal dose added per Dr. Phylliss Bob orders in presence of Team Leader, [place]. Patient assisted back to bed.

## 2014-02-19 NOTE — Transfer of Care (Signed)
Anesthesia Transfer of Care Note    Patient: Patricia [name]    Procedures performed: Procedure(s) with comments:  LAMINECTOMY, POSTERIOR LUMBAR, DECOMPRESSION, LEVEL 1 - L2-L3 HEMILAMINECTOMY, AND DISCECTOMY     Anesthesia type: General ETT    Patient location:Phase I PACU    Last vitals:   Filed Vitals:    02/19/14 1000   BP: 106/51   Pulse: 72   Temp: 36.8 C (98.2 F)   Resp: 18   SpO2: 100%       Post pain: Continue adjustment of pain medication; see anesthesia pacu orders     Mental Status:awake    Respiratory Function: tolerating nasal cannula    Cardiovascular: stable    Nausea/Vomiting: patient not complaining of nausea or vomiting    Hydration Status: adequate    Post assessment: no apparent anesthetic complications

## 2014-02-19 NOTE — Plan of Care (Signed)
Problem: Pain  Goal: Patient's pain/discomfort is manageable  Outcome: Progressing  Patient has PCA and is to be seen by Acute Pain Service  Intervention: Include patient/family/caregiver in decisions related to pain management  Patient involved in pain management decisions  Intervention: Offer non-pharmocological pain management interventions  Ice/ repositioning      Problem: Psychosocial and Spiritual Needs  Goal: Demonstrates ability to cope with hospitalization/illness  Outcome: Progressing  Family supportive  Intervention: Encourage verbalization of feelings/concerns/expectations  Patient not reluctant to verbalize needs  Intervention: Provide quiet environment  Requested soft block of room/adjacent bed per patient request. Other items provided as needed  Intervention: Assist patient to identify own strengths and abilities  Progressing  Intervention: Encourage patient to set small goals for self  Patient OOB and ambulating in hall with PT  Intervention: Encourage participation in diversional activity  Patient talking with husband  Intervention: Reinforce positive adaptation of new coping behaviors  Progressing  Intervention: Include patient/patient care companion in decisions  Spouse at bedside when instructed by PT

## 2014-02-19 NOTE — Plan of Care (Signed)
Problem: Day of Surgery- Lumbar Discectomy/Laminectomy  Goal: Free from Infection  Intervention: Monitor/assess vital signs  Per post-op protocol/orders.  Intervention: Maintain temperature within desired parameters.  VSS--monitored per post-op orders.  Intervention: Assess for signs and symptoms of infection.  Labs and VS being monitored per orders.  Intervention: Assess surgical dressing, reinforce or change as needed per order.  Surgical dressing c/d/i  Intervention: Teach/Reinforce use of incentive spirometer 10 times per hour while awake, cough and deep breath as needed.  Patient using andis able to achieve maximum of 2500 on IS.  Intervention: Administer and discontinue antibiotics per SCIP measures.  SCIP measures being followed.    Goal: Pain at adequate level as identified by patient  Outcome: Progressing  Pain consult with Dr. Renaldo Reel. Pt has PCA. Pain is currently controlled with PCA.  Intervention: Identify patient comfort function goal  Patient working toward a realistic comfort function goal of 3/4. (Per PACU nurse patient initially expected to have no pain post-op).  Intervention: Evaluate if patient comfort function goal is met  Comfort function goal being met with PCA.  Intervention: Ice therapy as indicated.  Ice therapy given to patient. She states it is ineffective.  Intervention: Administer muscle relaxer as prescribed  Patient aware that this is available. Declines at this time. States that pain is incisional pain.    Goal: Patient will maintain normal GI status  Outcome: Progressing  Patient tolerating regular diet. Hx of Chron's. Declines laxative/stool softener.  Intervention: Assess for nausea and/or vomiting Provide pharmacological and/or non-pharmacological support as needed.  No n/v  Intervention: Assess for normal bowel sounds  WNL  Intervention: Ensure appropriate diet; assess tolerance. (i.e. Nausea, Vomiting, Diarrhea)  Tolerating regular carb consistent diet.  Intervention: [place]  stool softener as prescribed  Pt refused due to hx of Chron's    Goal: Mobility/activity is maintained at optimum level for patient  Outcome: Progressing  Patient worked with PT and has ambulated in hallway twice since her surgery.  Intervention: Maintain spinal precautions.  Patient instructed by PT on spinal precautions and they have been reinforced.  Intervention: Log roll while in bed.  Patient compliant.  Intervention: Consult/Collaborate with Physical Therapy, Occupational Therapy and Speech Therapy  Done.  Intervention: Out of bed standing with assistance.  Achieved.  Intervention: Out of bed ambulating with assistance as tolerated prior to midnight.  Achieved.    Goal: Patient/Patient Care Companion demonstrates understanding of disease process, treatment plan, medications, and discharge plan  Outcome: Progressing  Patient states that Dr. Blase Mess may d/c her to home tomorrow if her pain is controlled.  Intervention: Patient received [facility] pre-op spine education in the form of Pre-op class, online video, back in action booklet, quiz.  Progressing.  Intervention: Orient to unit  Done  Intervention: Maintain spinal precautions.  Done.  Intervention: Provide, Review and Reinforce Back in Action booklet with patient/patient care companion.  Ongoing.  Intervention: Initiate/review discharge plans with patient/patient care companion.  Ongoing.  Intervention: Identify special equipment needs for discharge  Patient using walker in hospital. Will reassess need for walker prior to discharge.  Intervention: Consult/Collaborate with Case Management/Social Work  Ongoing.  Intervention: Provide discharged instructions, medication reconciliation, prescriptions to patient/caregiver  Progressing.

## 2014-02-19 NOTE — Plan of Care (Incomplete)
Problem: Day of Surgery- Lumbar Discectomy/Laminectomy  Goal: Stable Neurovascular Status  Outcome: Progressing  WNL, every 4 hours  Intervention: Monitor/assess neurovascular status (Pulses, capillary refill, pain, paresthesia, presence of edema).  Done every 4 hours  Intervention: Teach/review/reinforce ankle pump exercises.  Done by PT  Intervention: VTE Prevention: Administer anticoagulant(s) and/or apply anti-embolism stockings/devices as ordered.  SCD's/TEDS on    Goal: Free from Infection  Outcome: Progressing  Antibiotics per SCIP protocol, monitoring VS  Intervention: Monitor/assess vital signs  Per post-surgical protocol  Intervention: Maintain temperature within desired parameters.  WNL/monitoring

## 2014-02-19 NOTE — PACU (Signed)
Pt's husband updated of room number, did not answer phone when called 40 minutes ago.  Apology made due to delay of call. Pt resting, somewhat anxious at times

## 2014-02-19 NOTE — Anesthesia Postprocedure Evaluation (Signed)
Anesthesia Post Evaluation    Patient: Patricia [name]    Procedures performed: Procedure(s) with comments:  LAMINECTOMY, POSTERIOR LUMBAR, DECOMPRESSION, LEVEL 1 - L2-L3 HEMILAMINECTOMY, AND DISCECTOMY     Anesthesia type: General ETT    Patient location:Phase I PACU    Last vitals:   Filed Vitals:    02/19/14 1230   BP: 135/65   Pulse: 78   Temp:    Resp: 16   SpO2: 99%       Post pain: Patient not complaining of pain, continue current therapy      Mental Status:awake    Respiratory Function: tolerating room air    Cardiovascular: stable    Nausea/Vomiting: patient not complaining of nausea or vomiting    Hydration Status: adequate    Post assessment: no apparent anesthetic complications

## 2014-02-19 NOTE — Plan of Care (Signed)
Problem: Pain  Goal: Patient's pain/discomfort is manageable  Outcome: Progressing  PCA effective at this time. Pain management to consult.  Intervention: Include patient/family/caregiver in decisions related to pain management  Patient intimately familiar with her pain management needs.  Intervention: Offer non-pharmocological pain management interventions  Ice, repositioning      Problem: Psychosocial and Spiritual Needs  Goal: Demonstrates ability to cope with hospitalization/illness  Outcome: Progressing  Not tearful at this time (as reported by PACU)  Intervention: Encourage verbalization of feelings/concerns/expectations  Pt not hesitant to verbalize needs  Intervention: Provide quiet environment  Progressing  Intervention: Assist patient to identify own strengths and abilities  Ongoing  Intervention: Encourage patient to set small goals for self  Ongoing, OOB to chair and ambulated in hallway, IS  Intervention: Encourage participation in diversional activity  Progressing  Intervention: Reinforce positive adaptation of new coping behaviors  Reinforcement given  Intervention: Include patient/patient care companion in decisions  Done/progressing

## 2014-02-19 NOTE — PT Eval Note (Signed)
[facility]  [address]  [place], [place]. [address]    Department of Rehabilitation  [phone]    Physical Therapy Evaluation    Patient: Patricia [name]    MRN#: [id number]     Time of treatment: Time Calculation  PT Received On: 02/19/14  Start Time: 1424  Stop Time: 1514  Time Calculation (min): 50 min    PT Visit Number: 1    Consult received for Patricia [name] for PT Evaluation and Treatment.  Patient's medical condition is appropriate for Physical therapy intervention at this time.      Assessment:   Patricia [name] is a 63 y.o. female admitted 02/19/2014 presenting with good ambulation tolerance for day of Sx.     Impairments: Assessment: Gait impairment;Decreased balance;Decreased functional mobility;Decreased endurance/activity tolerance.     Therapy Diagnosis: PT aftercare for s/p L2-3 laminotomy and foraminotomy and discectomy   Left L3 hemilaminectomy and discectomy Decompression of Left L2 and L3 roots      Rehabilitation Potential: Prognosis: Good;With continued PT status post acute discharge      Plan:    Treatment/Interventions: Exercise;Gait training;Neuromuscular re-education;Functional transfer training;LE strengthening/ROM;Endurance training;Bed mobility;Patient/family training (stairs) PT Frequency: follow-up visit only    Risks/Benefits/POC Discussed with Pt/Family: With patient/family      Goals:   Goal Formulation: With patient/family  Time for Goal Achievement: By time of discharge  Goals: Select goal  Pt Will Go Supine To Sit: With stand by assist;to maximize functional mobility and independence;by time of discharge  Pt Will Transfer Bed/Chair: With stand by assist;to maximize functional mobility and independence;by time of discharge  Pt Will Ambulate: > 200 feet;with rolling walker;With stand by assist;to maximize functional mobility and independence;by time of discharge (to simulate household and walkway distances)  Pt Will Go Up / Down Stairs: 3-5 stairs;With stand by  assist;to maximize functional mobility and independence;by time of discharge (to simulate STE home)      Discharge Recommendations:   Discharge Recommendation: Home with supervision  DME Recommended for Discharge: Front wheel walker    Precautions and Contraindications: Spinal  Precautions  Precaution Instructions Given to Patient: Yes  Spinal Precautions: no bending;no twisting;no lifting    Medical Diagnosis: Lumbosacral spondylosis without myelopathy [721.3] (46962  95284)  Lumbago [724.2] (Lumbago [724.2][)  Herniated lumbar disc without myelopathy    History of Present Illness: Patricia [name] is a 63 y.o. female admitted on 02/19/2014 for a L2-3 laminotomy and foraminotomy and discectomy, Left L3 hemilaminectomy and discectomy, Decompression of Left L2 and L3 roots performed by Dr. Blase Mess.      Patient Active Problem List   Diagnosis   . Hypertensive disorder   . Sleep apnea   . Crohn's disease   . Crohn's disease   . GERD (gastroesophageal reflux disease)   . Hiatal hernia   . GERD (gastroesophageal reflux disease)   . Rheumatoid arthritis(714.0)   . Rheumatoid arthritis   . Chest pain   . Hyperglycemia   . Thyroid nodule   . RUQ abdominal tenderness   . Herniated lumbar disc without myelopathy   . Type 2 diabetes mellitus   . Anxiety and depression        Past Medical/Surgical History:  Past Medical History   Diagnosis Date   . Hypertensive disorder    . Hyperlipidemia    . Crohn's disease 1993   . GERD (gastroesophageal reflux disease)    . Rheumatoid arthritis(714.0)    . Lumbosacral spondylosis without myelopathy    .  Lumbago    . Sleep apnea      DOES NOT USE HER CPAP-PT DOES SNORE   . Type 2 diabetes mellitus, controlled 2014 OCT     BS -PT DOES NOT CK    . Kidney stone 2013     CYSTO , LITHOTRIPSY   . Hiatal hernia    . Bilateral cataracts SEPT/OCT 2014     REMOVED   . Claustrophobia      MRI   . Malignant neoplasm of skin 2008     BASAL CELL FACE-MOHS PROCEDURE   . Fracture of unspecified bones MAY  2014     RT WRIST 0ORIF      Past Surgical History   Procedure Date   . Tummy tuck 2010   . Breast bx left  1994   . Basal cell mohs 2008   . Cysto-kidney stone 12/2011   . Egd/colonoscopy 2011   . Ovarian cyst removal JUNE 2013   . Bilateral salpingoophorectomy 04/2012   . Eye surgery SEPT/OCT 2014     CATARACT REMOVED   . Fracture surgery MAY 2014     ORIF RT WRIST   . Laminectomy, posterior lumbar, decompression, level 1 02/19/2014     Procedure: LAMINECTOMY, POSTERIOR LUMBAR, DECOMPRESSION, LEVEL 1;  Surgeon: [name], MD;  Location:  MAIN OR;  Service: Orthopedics;  Laterality: N/A;  L2-L3 HEMILAMINECTOMY, AND DISCECTOMY        Social History:  Prior Level of Function  Prior level of function: Independent with ADLs;Ambulates independently (use SPC in the last few weeks)  Baseline Activity Level: Community ambulation;Household ambulation  Driving: independent  Cooking: Yes  Employment:  (volunteers at the gift shop)  DME Currently at Home: Single point cane  Home Living Arrangements  Living Arrangements: Spouse/significant other  Type of Home: House  Home Layout: Multi-level;Bed/bath upstairs;Other;Stairs to enter with rails (add number in comment) (3 STE)  Bathroom Shower/Tub: Pension scheme manager: Raised  DME Currently at Home: Single point cane      Subjective:    Patient is agreeable to participation in the therapy session.   Patient Goal  Patient Goal: to go home tomorrow  Pain Assessment  Pain Assessment: Numeric Scale (0-10)  Pain Score: 3-mild pain  Pain Location: Incision    Objective:   Observation of Patient/Vital Signs:  Patient is in bed with telemetry, peripheral IV, PCA, O2 at 2 liters/minute via nasal cannula and indwelling urinary catheter in place.    Inspection/Posture  Inspection/Posture: fwd head, mildly stooped in standing    Cognition  Arousal/Alertness: Appropriate responses to stimuli  Attention Span: Appears intact  Orientation Level: Oriented X4  Memory: Appears  intact  Following Commands: Follows all commands and directions without difficulty  Safety Awareness: minimal verbal instruction  Insights: Educated in Engineer, building services  Neuro Status  Behavior: calm;cooperative  Motor Planning: intact  Cognitive Deficit(s):  (WFL)  Hand Dominance: right handed    Gross ROM  Right Lower Extremity ROM: within functional limits  Left Lower Extremity ROM: within functional limits  Gross Strength  Right Lower Extremity Strength: within functional limits  Left Lower Extremity Strength: within functional limits  Tone  Tone: within functional limits    Functional Mobility  Supine to Sit: Stand by assistance  Scooting to EOB: Stand by assistance  Sit to Stand: Stand by assistance  Stand to Sit: Stand by assistance     Locomotion  Ambulation: stand by assistance;with front-wheeled walker  Ambulation Distance (Feet):  150 Feet  Pattern: decreased step length;decreased cadence   verbal instruction for step through sequencing to include correct RW placement with advancement of bilateral LE and proper use of both arms to help compensate for LE weakness and unsteady gait.  Verbal instruction provided for all above functional mobility with facilitation of correct postural alignment by therapist to facilitate safe technique.    Balance  Sitting - Static: Good  Sitting - Dynamic: Good  Standing - Static: Good  Standing - Dynamic: Good (with close S and use of RW)    Participation and Endurance  Participation Effort: good  Endurance:  (good for POD #1)    Treatment Activities: Patient was educated on spinal precautions with bed mobility as well as core bracing with transitional movements, proper turning, sleeping positions, proper breathing technique and workplace ergonomics.  Handouts provided for patient guidelines.    Educated the patient to role of physical therapy, plan of care, goals of therapy and safety with mobility and ADLs, energy conservation techniques, pursed lip breathing, spine  precautions, home safety.    Ina Kick PT, DPT, MS CEAS CCCE  [facility]  Physical Medicine and Rehabilitation Dept  Pager # [phone]

## 2014-02-19 NOTE — Progress Notes (Signed)
BRIEF OP NOTE    Date Time: 02/19/2014 10:17 AM    Patient Name:   Patricia [name]    Date of Operation:   02/19/2014    Providers Performing:   Surgeon(s):  Waldine Zenz, Karie Mainland, MD    Assistant (s):    Operative Procedure:   Procedure(s):  L2-3 laminotomy and foraminotomy and discectomy  Left L3 hemilaminectomy and discectomy  Decompression of Left L2 and L3 roots  Fluro     Preoperative Diagnosis:   Pre-Op Diagnosis Codes:     * Lumbosacral spondylosis without myelopathy [721.3]     * Lumbago [724.2]    Postoperative Diagnosis:   Same As Preop    Anesthesia:   General    Estimated Blood Loss:    Less than 10 cc    Implants:   * No implants in log *    Drains:   Drains: None    Specimens:   L2-3 disc    Findings:   Same as preop    Complications:   None       Signed by: [name], MD                                                                           [place] MAIN OR

## 2014-02-19 NOTE — Progress Notes (Signed)
Anesthesia Pain Brief Consult    Pleasant, opioid-tolerant 62-yo F s/p lumbar laminectomy and decompression earlier today with early encouraging results. Perioperative outpatient regimen is Oxycontin 20 mg TID. Currently also on Dilaudid IV PCA 0.3 mg Q6h min PRN. Concerned with short-acting nature of the IV bolus in anticipation of overnight stay. Also concerned with formulation of adequate discharge regimen. She is a patient at [facility] in [place], whom I am familiar with.    My plan is to continue Oxycontin and add a basal rate to the Dilaudid PCA. For PO breakthrough analgesia options I am ordering Dilaudid 8 mg PO Q3h PRN pain. It sounds like the patient may be able to go home tomorrow or the next day. I am writing outpatient scripts (Oxycontin 20 mg #90 and Dilaudid 4 mg #180) to help the patient transition out of the hospital and back to [place].    Verdis Prime, MD, PhD  Anesthesia and Pain Physician

## 2014-02-19 NOTE — Plan of Care (Signed)
Problem: Day of Surgery- Lumbar Discectomy/Laminectomy  Goal: Hemodynamic Stability  Outcome: Progressing  Being monitored with VS and lab work  Intervention: Monitor/assess vital signs  See charted VSS-WNL      Intervention: Maintain temperature within desired parameters.  Stable  Intervention: Monitor SpO2 and treat as needed  Stable, will use continuous pulse ox  Intervention: Monitor intake and output every shift  Pt eating, has foley  Intervention: Monitor intake and output. Notify LIP if urine output is < 30 ml/hr.  WNL

## 2014-02-19 NOTE — PACU (Signed)
1006 Pt arrives to pacu anxious, c/o tolerable pain 4/10, incisional. No resp distress.  1006, 1011, 1019, 1026, 1031, 1040, 1046, 1057 = 0.5 each dose, total of 4mg  IV Dilaudid for progressively worsening pain. Last dose, 1057, brought pain level back to 4/10  1050, 1055 Demerol for continued pain, total given 25mg  IV  Consultation done with Dr Durel Salts at 1045, orders obtained  Clonidine 0.1 mg PO dose given.  1143 Dilaudid PCA started, pt instructed on use. Pt asked specifically how many MG would be available per hour. 0.3mg  q6, 10/hr. Pt using PCA, states pain is at a more tolerable level. Pt tolerates po fluids, alert & oriented, no resp distress. Pt has been awake and chatty (crying until 1045)

## 2014-02-19 NOTE — Op Note (Signed)
Procedure Date: 02/19/2014     Patient Type: V     SURGEON: [name] MD  ASSISTANT:       PREOPERATIVE DIAGNOSES:  1.  Lumbar spondylosis and degeneration.  2.  Lumbar disk herniation at L2-L3 with caudal migration.  3.  Lower extremity radiculopathy and weakness.     POSTOPERATIVE DIAGNOSES:  1.  Lumbar spondylosis and degeneration.  2.  Lumbar disk herniation at L2-L3 with caudal migration.  3.  Lower extremity radiculopathy and weakness.     TITLE OF PROCEDURES:  1.  Left-sided L2-L3 laminotomy and foraminotomy and diskectomy.  2.  Left-sided L3-L5 hemilaminectomy.  3.  Decompression of L2 and L3 nerve roots.  4.  Fluoroscopic imaging.     ANESTHESIA:  General.     ESTIMATED BLOOD LOSS:  Less than 10 mL.     INTRAVENOUS FLUIDS AND URINE OUTPUT:  See anesthesia records.     COMPLICATIONS:  None.     IMPLANTS:  None.     DRAIN:  None.     SPECIMEN:  L2-L3 disk material sent to pathology for quantification of size and  amount.     INDICATIONS:  This is a patient who has had a left-sided L2-L3 disk herniation with  caudal migration.  She has failed conservative treatment, including  epidural injection.  She has significant pain with some quadriceps and  iliopsoas weakness.  She was informed of the risks and benefits of surgery  with the risks including but not limited to infection, bleeding, scarring,  damage to nerves and blood vessels, nerve-root injury, nerve-root scarring,  dural tear, epidural hematoma, paralysis, continued back and leg pain,  recurrent disk herniation, general risks of anesthesia, medical  complications, and blood clots.  She consented, was cleared medically.     DESCRIPTION OF PROCEDURE:  The patient was identified in the holding area.  Again, informed consent  was reviewed with her and her husband.  All her questions were answered.   She was taken to the operating suite.  She received general anesthesia.   Airway was secured.  Foley catheter was placed in.  She received  perioperative  antibiotics with 1 g of vancomycin.  She was placed on a  Jackson table with the [place] frame.  Eyes were checked.  Arms were placed  on arm board with adequate padding.  Her breasts were well-padded.  Her  abdomen was hanging free.  Her genitals were free of pressure.  Her legs  were kept extended on a sling without any pressure on the bony prominences.   Her back was shaved with an electric shaver, washed with alcohol, and she  was prepped and draped in a sterile manner.  After sterile prep, under  fluoroscopic imaging sterilely the L2-L3 segment was identified.  An  incision was made centered over this level measuring approximately 2.5 to 3  cm.  The dissection and then carried down with Bovie cautery to the level of the  fascia.  The fascia was incised in the same direction as the skin incision.   The L3 spinous process was identified and confirmed under fluoroscopic  imaging, including the L3 pedicle.  The paraspinal muscles were reflected  off the spinous processes of L2 and L3 to the level of the facet joint.   The L3 lamina, L3 pedicle, L3 pars and L2-L3 facet joints were identified.   At this point, using a high-speed burr the left L3 lamina was thinned out  along its entirety  and then this was resected with 2- and 3-mm Kerrison  while protecting the dura below.  The L2-L3 ligamentum flavum was resected.   Laminotomy was extend to the bottom of L2.  Resection of medial facet  joint was carried out with a high-speed burr.  This was minimal, only  measuring about 3 mm.  This decompression was further extended with  Kerrisons to the lateral recess.  The shoulder of the exiting L2 nerve root  was identified and this was decompressed.  Traversing L3 nerve root was  identified and this was retracted.  A large disk fragment was identified  below the L3 traversing nerve root with caudal migration.  An annulotomy  was made and then a copious amount of disk material was resected.  The L3  nerve root was followed  throughout its course below the L3 pedicle and into  the foramen and this course was decompressed.  At the conclusion of this,  the traversing L3 nerve root and exiting L2 nerve roots were completely  free and then any loose fragments with caudal migration were removed by  using a Woodson and a nerve hook.  Final x-rays were obtained confirming  the level.  The inside of disk space was probed and there were no further  loose fragments.  This area was thoroughly irrigated and excellent  hemostasis was ensured.  The paramuscular layer and the fascial layer were  closed with #1 Vicryl in a figure-of-eight interrupted fashion.  This was  over-repaired with a #1 running layer, the deep subcutaneous tissue with 0  Vicryl, superficial subcutaneous with 2-0 Vicryl, and the skin with 4-0  Vicryl.  The wound was then infiltrated with Marcaine with epinephrine.   The wound was covered with fibrin glue and then covered with Xeroform gauze  and paper tape.  The patient was then taken out of sterile prep and drape  and transferred to her own bed.  Throughout the case, neural monitoring,  which was monitoring the conus level, was intact.  There was no abnormal  firing otherwise.  She tolerated the procedure without any complication.   Her blood pressure was slightly low during the beginning of surgery, was  maintained with anesthesia staying in communication with me.  She was  extubated and was able to wiggle her toes and was taken to recovery room in  stable condition.  I was present for the entire surgical procedure.           D:  02/19/2014 10:14 AM by Dr. [name], MD ([id number])  T:  02/19/2014 19:58 PM by       Everlean Cherry: [id number]) (Doc ID: [id number])

## 2014-02-20 DIAGNOSIS — M5126 Other intervertebral disc displacement, lumbar region: Principal | ICD-10-CM

## 2014-02-20 DIAGNOSIS — M47817 Spondylosis without myelopathy or radiculopathy, lumbosacral region: Secondary | ICD-10-CM

## 2014-02-20 LAB — GLUCOSE WHOLE BLOOD - POCT
Whole Blood Glucose POCT: 122 mg/dL — ABNORMAL HIGH (ref 70–100)
Whole Blood Glucose POCT: 136 mg/dL — ABNORMAL HIGH (ref 70–100)
Whole Blood Glucose POCT: 118 mg/dL — ABNORMAL HIGH (ref 70–100)
Whole Blood Glucose POCT: 158 mg/dL — ABNORMAL HIGH (ref 70–100)

## 2014-02-20 LAB — BASIC METABOLIC PANEL
Anion Gap: 8 (ref 5.0–15.0)
BUN: 9.4 mg/dL (ref 7.0–19.0)
CO2: 29 mEq/L (ref 22–29)
Calcium: 8.4 mg/dL — ABNORMAL LOW (ref 8.5–10.5)
Chloride: 103 mEq/L (ref 98–107)
Creatinine: 0.6 mg/dL (ref 0.6–1.0)
Glucose: 122 mg/dL — ABNORMAL HIGH (ref 70–100)
Potassium: 4.2 mEq/L (ref 3.5–5.1)
Sodium: 140 mEq/L (ref 136–145)

## 2014-02-20 LAB — CBC
Hematocrit: 32 % — ABNORMAL LOW (ref 37.0–47.0)
Hgb: 10 g/dL — ABNORMAL LOW (ref 12.0–16.0)
MCH: 28.7 pg (ref 28.0–32.0)
MCHC: 31.3 g/dL — ABNORMAL LOW (ref 32.0–36.0)
MCV: 92 fL (ref 80.0–100.0)
MPV: 8.7 fL — ABNORMAL LOW (ref 9.4–12.3)
Platelets: 195 10*3/uL (ref 140–400)
RBC: 3.48 10*6/uL — ABNORMAL LOW (ref 4.20–5.40)
RDW: 16 % — ABNORMAL HIGH (ref 12–15)
WBC: 4.87 10*3/uL (ref 3.50–10.80)

## 2014-02-20 LAB — GFR: EGFR: >60

## 2014-02-20 MED ORDER — HYDROMORPHONE HCL 4 MG PO TABS
4.0000 mg | ORAL_TABLET | ORAL | Status: DC | PRN
Start: 2014-02-20 — End: 2014-02-20

## 2014-02-20 MED ORDER — HYDROMORPHONE HCL 4 MG PO TABS
6.0000 mg | ORAL_TABLET | ORAL | Status: DC | PRN
Start: 2014-02-20 — End: 2014-02-21
  Administered 2014-02-20 – 2014-02-21 (×7): 6 mg via ORAL
  Filled 2014-02-20 (×14): qty 1

## 2014-02-20 MED ORDER — OXYCODONE HCL ER 10 MG PO T12A
30.0000 mg | EXTENDED_RELEASE_TABLET | Freq: Three times a day (TID) | ORAL | Status: DC
Start: 2014-02-20 — End: 2014-02-21
  Administered 2014-02-20 – 2014-02-21 (×3): 30 mg via ORAL
  Filled 2014-02-20 (×6): qty 1

## 2014-02-20 NOTE — Plan of Care (Signed)
Problem: Safety  Goal: Patient will be free from injury during hospitalization  Outcome: Progressing  Intervention: Assess patient's risk for falls and implement fall prevention plan of care per policy  High risk for falls. Fall prevention plan in place.   Intervention: Provide and maintain safe environment  Bed locked and in lowest position. Recliner chair at bedside, also locked. Environment clutter free.   Intervention: Ensure appropriate safety devices are available at the bedside  Call bell, bedside table, personal belongings within reach  Intervention: Include patient/family/caregiver in decisions related to safety  Husband updated  Intervention: Hourly rounding.  Rounding per shift routine to assess for needs  Intervention: Assess for patient's risk for elopement and implement Elopement Risk plan per policy  No risk for elopement  Intervention: Provide alternative method of communication if needed (communication boards, writing).  Communication board updated with current shift nurse/tech/charge nurse extensions.       Problem: Day 1 Post-op- Lumbar Discectomy/Laminectomy  Goal: Free from Infection  Outcome: Progressing  IV antibiotics still on board  Goal: Pain at adequate level as identified by patient  Outcome: Progressing  Pain be maintained/controlled with PO medications  Intervention: Reposition patient every 2 hours and PRN unless able to self reposition.  Self position  Intervention: PCA/PCE discontinued if indicated, convert to oral pain medications as ordered  PCA Court[place]'d today. PO pain medications began

## 2014-02-20 NOTE — PT Progress Note (Signed)
[facility]  [address]  [place], [place]. [address]    Department of Rehabilitation  [phone]    Physical Therapy Daily Treatment Note    Patient: Patricia [name]    MRN#: [id number]     Time of Treatment: Start Time: 1105 Stop Time: 1145 Time Calculation (min): 40 min    PT Visit Number: 2    Patient's medical condition is appropriate for Physical Therapy intervention at this time.    Precautions and Contraindications:   Precautions  Spinal Precautions: no bending;no twisting;no lifting    Assessment:   Pt making nice progess toward goals - pain not limiting pt's mobility as much today as at eval.  Pt able to amb with RW with little difficulty & able to negotiate stairs with increased time due to back discomfort.    Anticipate pt will do well at discharge as long as she continues to amb as often as possible.  Recommend home PT to progress pt as appropriate & teach in core stability exercises.      Assessment: Decreased LE strength;Decreased endurance/activity tolerance;Decreased functional mobility;Decreased balance;Gait impairment Prognosis: Good;With continued PT status post acute discharge   Progress: Progressing toward goals            Plan:   Continue with Physical Therapy services to address gait impairment & balance deficits due to pain from surgery. Focus next session on practicing stairs again if needed as well as bed mob & continue with amb.  Treatment/Interventions: Exercise;Gait training;Functional transfer training;LE strengthening/ROM;Endurance training;Patient/family training;Equipment eval/education;Bed mobility         Discharge Recommendation: Home with home health PT  Equipment Recommendations: DME Recommended for Discharge: Front wheel walker        Subjective: Patient is agreeable to participation in the therapy session.   Pain Assessment  Pain Assessment: Numeric Scale (0-10)  Pain Score: 6-moderate pain (before & after tx)  Pain Location: Incision  Pain Orientation:  Lower  Pain Descriptors: Sore;Sharp  Pain Frequency: Increases with movement  Effect of Pain on Daily Activities: mild  Multiple Pain Sites: Yes     Objective:  Observation of Patient/Vital Signs:  Patient is seated in a bedside chair with peripheral IV in place.    Cognition  Arousal/Alertness: Appropriate responses to stimuli  Attention Span: Appears intact  Orientation Level: Oriented X4  Memory: Appears intact  Following Commands: Follows all commands and directions without difficulty  Safety Awareness: minimal verbal instruction  Insights: Educated in Building control surveyor  Sit to Stand: Stand by assistance (for safety)  Stand to Sit: Stand by assistance (for safety )     Locomotion  Ambulation: stand by assistance  Ambulation Distance (Feet): 200 Feet  Pattern: decreased cadence;decreased step length  Stair Management: stand by assistance;two rails;alternating pattern  Number of Stairs: 4                     Treatment Activities: Instructed pt on seated LE ther ex to perform for increased circulation when seated - ankle pumps/circles, LAQs & hip abd/add.  Pt able to return demo all independently.  Pt able to verbalize 2/3 spinal precautions - educated pt on lifting precaution again.      Educated the patient to role of physical therapy, plan of care, goals of therapy and HEP, safety with mobility and ADLs, spine precautions, discharge instructions, home safety.    Patient left without needs and call bell within reach.  RN notified  of session outcome.    Allyne Gee, PT, MS  [phone]

## 2014-02-20 NOTE — Progress Notes (Signed)
PROGRESS NOTE    Date Time: 02/20/2014 8:46 AM  Patient Name: [name],[name]        Subjective:   Left thigh pain is much reduced  Moderate incisional pain  No saddle anesthesia     Objective:   BP 123/66  Pulse 86  Temp 99.5 F (37.5 C) (Oral)  Resp 14  Ht 1.702 m (5\' 7" )  Wt 89.268 kg (196 lb 12.8 oz)  BMI 30.82 kg/m2  SpO2 96%    Motor Exam UE:     Deltoid Biceps Triceps Wrist Ext Wrist Flex Finger Ext Intrinsic Hand   Right 5 5 5 5 5 5 5    Left 5 5 5 5 5 5 5      Sensation intact B UE in all dermatomes    Motor Exam LE:     Psoas GM Quads Hamst Abd Add TA EHL Gastroc   Right 5 5 5 5 5 5 5 5 5    Left 5 5 5 5 5 5 5 5 5      Sensation intact B LE in all dermatomes  Denies saddle anesthesia    Assessment:   POD 1    Plan:   Will keep one more day due moderate incisional pain  PT/OT  D/c pca and foley     Signed by: [name], M.D.

## 2014-02-20 NOTE — Progress Notes (Signed)
[facility] Hospitalist Daily Progress Note        Date Time: 02/20/2014  10:44 AM  Patient Name:Patricia [name]  YSA:[id number]  PCP: No primary provider on file.  Attending Physician:Antar Milks Heide Spark M.D.      Chief Complaint:   Spine surgery    Subjective:   Complains of moderate incisional pain  Denies shortness of breath, chest pain  Reports having no bowel movement, but refusing stool softeners  Assessment/Plan   Active Problems:   Herniated lumbar disc without myelopathy   Type 2 diabetes mellitus   Hypertensive disorder   Sleep apnea   Crohn's disease   GERD (gastroesophageal reflux disease)   Rheumatoid arthritis(714.0)    Anxiety and depression    1.  Herniated lumbar disc.  Patient is status post a lumbar laminectomy by Dr. Blase Mess POD#1.  PCA to be discontinued today.  Pain management per spine surgery.  Patient will be monitored 1 more day secondary to incisional pain.  2.  Type 2 diabetes mellitus.  Continue sliding scale insulin.  Linagliptan and on hold  3.  Hypertension, well controlled.  Continue present antihypertensives.  4.  Gastroesophageal reflux disease.  Continue Protonix.  5.  Anxiety.  Continue Klonopin.          Allergies:     Allergies   Allergen Reactions   . Cefuroxime Other (See Comments)     DOESN'T INTERACT W/ MEDS   . Asacol (Mesalamine)    . Other      6MP(chemo drug);and adhesive  Tape   . Sulfa Antibiotics Rash       Physical Exam:    height is 1.702 m (5\' 7" ) and weight is 89.268 kg (196 lb 12.8 oz). Her oral temperature is 99.5 F (37.5 C). Her blood pressure is 123/66 and her pulse is 86. Her respiration is 14 and oxygen saturation is 96%.   Body mass index is 30.82 kg/(m^2).  Filed Vitals:    02/19/14 1854 02/19/14 2124 02/20/14 0151 02/20/14 0607   BP: 118/57 116/63 108/57 123/66   Pulse: 85 75 76 86   Temp: 97.3 F (36.3 C) 95.9 F (35.5 C) 98.1 F (36.7 C) 99.5 F (37.5 C)   TempSrc: Temporal Artery Temporal Artery Oral Oral    Resp: 16 16 16 14    Height:       Weight:       SpO2: 94% 97% 97% 96%     Intake and Output Summary (Last 24 hours) at Date Time    Intake/Output Summary (Last 24 hours) at 02/20/14 1044  Last data filed at 02/20/14 0607   Gross per 24 hour   Intake    890 ml   Output   2875 ml   Net  -1985 ml     Constitutional: Patient is oriented to person, place, and time. Patient appears well-developed and well-nourished.   Cardiovascular: Normal rate, regular rhythm, normal heart sounds and intact distal pulses.  Exam reveals no gallop and no friction rub. No murmur heard.  Pulmonary/Chest: Effort normal and breath sounds normal. No stridor. No respiratory distress. Patient has no wheezes. No rales were present.  Exhibits no tenderness.   Abdominal: Soft. Bowel sounds are normal. Patient exhibits no distension and no mass was palpable. There is no tenderness. There is no rebound and no guarding.   Musculoskeletal: Normal range of motion. Patient exhibits no edema.  Positive incisional tenderness in lower back.       Consult Input/Plan  Plan  None    Review of Systems:   A comprehensive review of systems has no changes since H&P was obtained except as mentioned in the subjective section.    Vitals 24 hrs:   Filed Vitals:    02/19/14 1854 02/19/14 2124 02/20/14 0151 02/20/14 0607   BP: 118/57 116/63 108/57 123/66   Pulse: 85 75 76 86   Temp: 97.3 F (36.3 C) 95.9 F (35.5 C) 98.1 F (36.7 C) 99.5 F (37.5 C)   TempSrc: Temporal Artery Temporal Artery Oral Oral   Resp: 16 16 16 14    Height:       Weight:       SpO2: 94% 97% 97% 96%        Readmission:   Hospital Outpatient Visit on 02/18/2014   Component Date Value Range Status   . ABO Rh 02/18/2014 A POS   Final   . AB Screen Gel 02/18/2014 NEG   Final        Coagulation Profile: No results found for this basename: PT,INR,PTT in the last 168 hours       Medications:   Current Facility-Administered Medications   Medication Dose Route Frequency Last Rate Last Dose   .  0.9%  NaCl infusion   Intravenous Continuous 100 mL/hr at 02/19/14 1416     . acetaminophen (TYLENOL) tablet 650 mg  650 mg Oral Q4H PRN   650 mg at 02/20/14 0526   . carvedilol (COREG) tablet 12.5 mg  12.5 mg Oral BID Meals   12.5 mg at 02/20/14 0807   . clonazePAM (KlonoPIN) tablet 1 mg  1 mg Oral QHS   1 mg at 02/19/14 2235   . [COMPLETED] cloNIDine (CATAPRES) tablet 0.1 mg  0.1 mg Oral Once   0.1 mg at 02/19/14 1127   . dextrose (GLUCOSE) 40 % oral gel 15 g of glucose  15 g of glucose Oral PRN       . dextrose 50 % bolus 25 mL  25 mL Intravenous PRN       . diazepam (VALIUM) tablet 5 mg  5 mg Oral Q6H PRN       . diphenhydrAMINE (BENADRYL) capsule 25 mg  25 mg Oral Q6H PRN       . DULoxetine (CYMBALTA) DR capsule 30 mg  30 mg Oral BID   30 mg at 02/20/14 1024   . famotidine (PEPCID) tablet 40 mg  40 mg Oral QHS       . glucagon (rDNA) (GLUCAGEN) injection 1 mg  1 mg Intramuscular PRN       . hydrochlorothiazide (HYDRODIURIL) tablet 25 mg  25 mg Oral QAM   25 mg at 02/20/14 0807   . [COMPLETED] HYDROmorphone (DILAUDID) injection 0.5 mg  0.5 mg Intravenous Q5 Min PRN   0.5 mg at 02/19/14 1057   . HYDROmorphone (DILAUDID) tablet 6 mg  6 mg Oral Q4H PRN   6 mg at 02/20/14 1025   . insulin aspart (NovoLOG) injection 1-3 Units  1-3 Units Subcutaneous QHS and 0300 PRN       . insulin aspart (NovoLOG) injection 1-5 Units  1-5 Units Subcutaneous TID AC PRN       . lisinopril (PRINIVIL,ZESTRIL) tablet 20 mg  20 mg Oral Daily   20 mg at 02/20/14 1024   . ondansetron (ZOFRAN) injection 4 mg  4 mg Intravenous Q8H PRN       . oxyCODONE (OxyCONTIN) 12 hr tablet 30 mg  30 mg  Oral TID       . pantoprazole (PROTONIX) EC tablet 40 mg  40 mg Oral BID AC   40 mg at 02/20/14 0807   . phenol-menthol (CEPASTAT) lozenge 1 lozenge  1 lozenge Buccal PRN       . senna-docusate (PERICOLACE) 8.6-50 MG per tablet 1 tablet  1 tablet Oral BID   1 tablet at 02/19/14 1706   . spironolactone (ALDACTONE) tablet 25 mg  25 mg Oral QAM   25 mg at  02/20/14 0807   . vancomycin (VANCOCIN) 1 g in sodium chloride 0.9 % 250 mL IVPB  1 g Intravenous Q12H 250 mL/hr at 02/20/14 0620 1 g at 02/20/14 0620   . zolpidem (AMBIEN) tablet 5 mg  5 mg Oral QHS PRN   5 mg at 02/19/14 0600   . [DISCONTINUED] bacitracin (BACIIM) injection    PRN   50,000 Units at 02/19/14 1007   . [DISCONTINUED] bupivacaine-EPINEPHrine (PF) (SENSORCAINE/EPINEPHRINE) 0.25 % injection    PRN   20 mL at 02/19/14 1000   . [DISCONTINUED] diphenhydrAMINE (BENADRYL) injection 12.5 mg  12.5 mg Intravenous Q10 Min PRN       . [DISCONTINUED] diphenhydrAMINE (BENADRYL) injection 12.5 mg  12.5 mg Intravenous Q6H PRN       . [DISCONTINUED] HYDROmorphone (DILAUDID) 0.2 mg/mL in sodium chloride 0.9% 100 mL PCA 100 mL   Intravenous Continuous   20 mg at 02/19/14 1143   . [DISCONTINUED] HYDROmorphone (DILAUDID) 0.2 mg/mL in sodium chloride 0.9% 100 mL PCA   Intravenous Continuous   20 mg at 02/19/14 2147   . [DISCONTINUED] HYDROmorphone (DILAUDID) injection 0.5 mg  0.5 mg Intravenous Q5 Min PRN   0.5 mg at 02/19/14 1126   . [DISCONTINUED] HYDROmorphone (DILAUDID) tablet 4 mg  4 mg Oral Q3H PRN       . [DISCONTINUED] HYDROmorphone (DILAUDID) tablet 8 mg  8 mg Oral Q4H PRN       . [DISCONTINUED] HYDROmorphone PCA 0.2 mg/mL BOLUS from PCA  0.5 mg Intravenous Q10 Min PRN       . [DISCONTINUED] lactated ringers infusion   Intravenous Continuous       . [DISCONTINUED] lactated ringers infusion   Intravenous Continuous 50 mL/hr at 02/19/14 1249     . [DISCONTINUED] meperidine (DEMEROL) injection 12.5 mg  12.5 mg Intravenous Q10 Min PRN   12.5 mg at 02/19/14 1055   . [DISCONTINUED] naloxone (NARCAN) injection 0.1 mg  0.1 mg Intravenous PRN       . [DISCONTINUED] ondansetron (ZOFRAN) injection 4 mg  4 mg Intravenous Once PRN       . [DISCONTINUED] ondansetron (ZOFRAN) injection 4 mg  4 mg Intravenous TID PRN       . [DISCONTINUED] oxyCODONE (OxyCONTIN) 12 hr tablet 20 mg  20 mg Oral TID   20 mg at 02/20/14 0807   .  [DISCONTINUED] promethazine (PHENERGAN) injection 6.25 mg  6.25 mg Intravenous Once PRN       . [DISCONTINUED] promethazine (PHENERGAN) injection 6.25 mg  6.25 mg Intravenous BID PRN            CBC review:   Lab 02/20/14 0657   WBC 4.87   HGB 10.0*   HCT 32.0*   PLT 195   MCV 92.0   RDW 16*   NEUTRO --   LYMPHOCYTESA --   EOSINOPHILSA --   IMMATUREGRAN --   NEUTROABS --   ABSOLUTEIMMA --        Chem Review:  Lab 02/20/14 0657   NA 140   K 4.2   CL 103   CO2 29   BUN 9.4   CREAT 0.6   GLU 122*   CA 8.4*   MG --   PHOS --   BILITOTAL --   AST --   ALT --   ALKPHOS --        Labs:     Results     Procedure Component Value Units Date/Time    Glucose Whole Blood - POCT [[id number]]  (Abnormal) Collected:02/20/14 0740     POCT - Glucose Whole blood 122 (H) mg/dL [id number]:86/57/84 6962    Basic Metabolic Panel (QAM x 1) [[id number]]  (Abnormal) Collected:02/20/14 0657    Specimen Information:Blood Updated:02/20/14 0749     Glucose 122 (H) mg/dL      BUN 9.4 mg/dL      Creatinine 0.6 mg/dL      CALCIUM 8.4 (L) mg/dL      Sodium 401 mEq/L      Potassium 4.2 mEq/L      Chloride 103 mEq/L      CO2 29 mEq/L      Anion Gap 8.0     GFR [[id number]] Collected:02/20/14 0657     EGFR >60.0 Updated:02/20/14 0749    CBC (QAM x 2) [[id number]]  (Abnormal) Collected:02/20/14 0657    Specimen Information:Blood / Blood Updated:02/20/14 0713     WBC 4.87 x10 3/uL      RBC 3.48 (L) x10 6/uL      Hgb 10.0 (L) g/dL      Hematocrit 56.3 (L) %      MCV 92.0 fL      MCH 28.7 pg      MCHC 31.3 (L) g/dL      RDW 16 (H) %      Platelets 195 x10 3/uL      MPV 8.7 (L) fL     Glucose Whole Blood - POCT [[id number]]  (Abnormal) Collected:02/19/14 2126     POCT - Glucose Whole blood 128 (H) mg/dL JJOACZY:60/63/01 6010    Glucose Whole Blood - POCT [[id number]]  (Abnormal) Collected:02/19/14 1659     POCT - Glucose Whole blood 140 (H) mg/dL KGURKYH:04/28/75 2831    Glucose Whole Blood - POCT [[id number]]  (Abnormal) Collected:02/19/14 1351     POCT - Glucose  Whole blood 117 (H) mg/dL XTGGYIR:48/54/62 7035        Rads:   Radiological Procedure reviewed.  Radiology Results (24 Hour)     ** No Results found for the last 24 hours. **          Time spent for evaluation, management and coordination of care:   28 min          Signed by: Julien Girt  02/20/2014 10:44 AM

## 2014-02-21 LAB — GLUCOSE WHOLE BLOOD - POCT
Whole Blood Glucose POCT: 126 mg/dL — ABNORMAL HIGH (ref 70–100)
Whole Blood Glucose POCT: 169 mg/dL — ABNORMAL HIGH (ref 70–100)

## 2014-02-21 MED ORDER — SENNOSIDES-DOCUSATE SODIUM 8.6-50 MG PO TABS
1.0000 | ORAL_TABLET | Freq: Two times a day (BID) | ORAL | Status: AC
Start: 2014-02-21 — End: ?

## 2014-02-21 MED ORDER — HYDROMORPHONE HCL 2 MG PO TABS
4.0000 mg | ORAL_TABLET | ORAL | Status: DC | PRN
Start: 2014-02-21 — End: 2018-02-20

## 2014-02-21 MED ORDER — DIAZEPAM 5 MG PO TABS
5.0000 mg | ORAL_TABLET | Freq: Four times a day (QID) | ORAL | Status: AC | PRN
Start: 2014-02-21 — End: ?

## 2014-02-21 MED ORDER — OXYCODONE HCL ER 30 MG PO T12A
30.0000 mg | EXTENDED_RELEASE_TABLET | Freq: Three times a day (TID) | ORAL | Status: DC
Start: 2014-02-21 — End: 2014-02-21

## 2014-02-21 MED ORDER — OXYCODONE HCL ER 10 MG PO T12A
20.0000 mg | EXTENDED_RELEASE_TABLET | Freq: Three times a day (TID) | ORAL | Status: DC
Start: 2014-02-21 — End: 2018-02-20

## 2014-02-21 NOTE — Discharge Instructions (Signed)
Dressing Care: Please provide following dressing change instructions to patients on day of discharge: Change dressing at least one daily, if excessive sweating change at least 2x a day, remove dressing before showering, leave steri-strips (if any) on, apply clean dressing after showering, minimize use of tape.     Activity Restrictions: No lifting, No bending, No twisting, No driving     May shower after POD #3     Follow up with me in 2 weeks     Obtain X-rays before f/u (if a fusion surgery has been performed    [facility] [phone] will contact you to schedule Physical Therapy visits to your home. If you do not hear from them within 1 business day, please call them.    A walker will be provided by [employer] [phone] and will be delivered to your room prior to discharge.

## 2014-02-21 NOTE — Plan of Care (Signed)
Problem: Safety  Goal: Patient will be free from injury during hospitalization  Intervention: Assess patient's risk for falls and implement fall prevention plan of care per policy  Hourly rounding  Call bell within reach  Provide and maintain safe environment  Involve  paient  and family in care  Bed alarm on  Assess fall risk and update on white board   Keep door open and stay near room   Assess fall risk   Provide and maintain safe environment  Call bell within reach  Hourly rounding  Bed alarm on

## 2014-02-21 NOTE — Discharge Summary (Signed)
Discharge Date:      ATTENDING PHYSICIAN:  [name], MD     HOSPITAL COURSE:  This patient was admitted to the hospital.  She underwent left side L2-L3  laminotomy and foraminotomy, left L3 hemilaminectomy and decompression of  L2 and L3 nerve roots and diskectomy.  She tolerated the procedure without  any complication.  She was taken from the recovery room to the floor in  stable condition.  Because of her history of chronic pain and significant  pain from this condition preexistingly and postoperatively, she was  maintained on her dose of OxyContin and with a PCA.  On postoperative day  #1, her radicular pain was resolved.  She was still having minor incisional  pain.  Her OxyContin was increased.  She was maintained on Dilaudid.  She  was receiving Valium for spasms.  She ambulated.  She was changed to  inpatient status.  On postoperative day #2, she was having better control  of incisional pain.  Leg pain was resolved.  She ambulated 200 feet.  She  had voided.  At this point, she was deemed medically and surgically stable  for discharge to home.     DISCHARGE INSTRUCTIONS:  She is to follow up with me in 2 weeks.  She is to avoid lifting, bending  or twisting.  She is to call me for any fevers, chills, drainage from the  incision, or recurrence of major radiculopathy.  She is to keep her brace  on when out of bed and walking.  She may shower starting postoperative day  #3.  Dressing change instructions were reviewed.     DISCHARGE MEDICATIONS:  Please refer to the medication reconciliation form.           D:  02/21/2014 09:03 AM by Dr. [name], MD ([id number])  T:  02/21/2014 09:45 AM by [id number]      Everlean Cherry: [id number]) (Doc ID: [id number])

## 2014-02-21 NOTE — Progress Notes (Signed)
PROGRESS NOTE    Date Time: 02/21/2014 8:56 AM  Patient Name: Patricia [name],Patricia [name]        Subjective:   Resolving incisional pain  No leg pain  Voiding well  Walked 200 feet with PT    Objective:   BP 129/63  Pulse 88  Temp 98.6 F (37 C) (Temporal Artery)  Resp 18  Ht 1.702 [name] (5\' 7" )  Wt 89.268 kg (196 lb 12.8 oz)  BMI 30.82 kg/m2  SpO2 97%    Motor Exam UE:     Deltoid Biceps Triceps Wrist Ext Wrist Flex Finger Ext Intrinsic Hand   Right 5 5 5 5 5 5 5    Left 5 5 5 5 5 5 5      Sensation intact B UE in all dermatomes    Motor Exam LE:     Psoas GM Quads Hamst Abd Add TA EHL Gastroc   Right 5 5 5 5 5 5 5 5 5    Left 5 5 5 5 5 5 5 5 5      Sensation intact B LE in all dermatomes  Denies saddle anesthesia  Dressing clean and dry  Calves soft and NT    Assessment:   POD 2    Plan:   D/C instructions reviewed.  Stable for D/c    Signed by: [name], [name].D.

## 2014-02-21 NOTE — Progress Notes (Signed)
Pt is being d/c to home iv was removed tip was intact site has no sign of infection, dsg ws changed and showed it to the family home health  Info is given.walker was provided  To family.dr Jacolyn Reedy was contacted as pt pain scrip was rewritten by dr reddy which was not providing sufficient amt. Pt is been escorted towards ca via wheelchair by nurse pt et at 1504

## 2014-02-21 NOTE — Progress Notes (Signed)
[facility] Hospitalist Daily Progress Note        Date Time: 02/21/2014  1:29 PM  Patient Name:Patricia [name]  VHQ:[id number]  PCP: No primary provider on file.  Attending Physician:Jalaine Riggenbach Heide Spark M.D.      Chief Complaint:   Spine surgery    Subjective:   Pain better controlled today  Denies chest pain, shortness of breath  Had an episode of hypotension after receiving blood pressure medications and pain medications which currently resolved.  Assessment/Plan   Active Problems:   Herniated lumbar disc without myelopathy   Type 2 diabetes mellitus   Hypertensive disorder   Sleep apnea   Crohn's disease   GERD (gastroesophageal reflux disease)   Rheumatoid arthritis(714.0)    Anxiety and depression    1.  Herniated lumbar disc.  Patient is status post a lumbar laminectomy by Dr. Blase Mess POD#2.  Pain better controlled today.  Okay to discharge from spine surgery perspective  2.  Type 2 diabetes mellitus.  Resume Tradjenta after discharge  3.  Hypertension, well controlled.  Continue op antihypertensives.  4.  Gastroesophageal reflux disease.  Continue Protonix.  5.  Anxiety.  Continue Klonopin.          Allergies:     Allergies   Allergen Reactions   . Cefuroxime Other (See Comments)     DOESN'T INTERACT W/ MEDS   . Asacol (Mesalamine)    . Other      6MP(chemo drug);and adhesive  Tape   . Sulfa Antibiotics Rash       Physical Exam:    height is 1.702 m (5\' 7" ) and weight is 89.268 kg (196 lb 12.8 oz). Her temporal artery temperature is 96.4 F (35.8 C). Her blood pressure is 88/52 and her pulse is 71. Her respiration is 16 and oxygen saturation is 100%.   Body mass index is 30.82 kg/(m^2).  Filed Vitals:    02/20/14 2128 02/21/14 0142 02/21/14 0615 02/21/14 0958   BP: 103/53 141/65 129/63 88/52   Pulse: 85 90 88 71   Temp: 97.5 F (36.4 C) 97.9 F (36.6 C) 98.6 F (37 C) 96.4 F (35.8 C)   TempSrc: Temporal Artery Temporal Artery Temporal Artery Temporal Artery   Resp:  17 16 18 16    Height:       Weight:       SpO2: 94% 97% 97% 100%     Intake and Output Summary (Last 24 hours) at Date Time    Intake/Output Summary (Last 24 hours) at 02/21/14 1329  Last data filed at 02/21/14 0000   Gross per 24 hour   Intake   1200 ml   Output      0 ml   Net   1200 ml     Constitutional: Patient is oriented to person, place, and time. Patient appears well-developed and well-nourished.   Cardiovascular: Normal rate, regular rhythm, normal heart sounds and intact distal pulses.  Exam reveals no gallop and no friction rub. No murmur heard.  Pulmonary/Chest: Effort normal and breath sounds normal. No stridor. No respiratory distress. Patient has no wheezes. No rales were present.  Exhibits no tenderness.   Abdominal: Soft. Bowel sounds are normal. Patient exhibits no distension and no mass was palpable. There is no tenderness. There is no rebound and no guarding.   Musculoskeletal: Normal range of motion. Patient exhibits no edema.  Mild incisional tenderness in lower back.       Consult Input/Plan     Plan  [facility] FACE-TO-FACE (FTF) ENCOUNTER    Review of Systems:   A comprehensive review of systems has no changes since H&P was obtained except as mentioned in the subjective section.    Vitals 24 hrs:   Filed Vitals:    02/20/14 2128 02/21/14 0142 02/21/14 0615 02/21/14 0958   BP: 103/53 141/65 129/63 88/52   Pulse: 85 90 88 71   Temp: 97.5 F (36.4 C) 97.9 F (36.6 C) 98.6 F (37 C) 96.4 F (35.8 C)   TempSrc: Temporal Artery Temporal Artery Temporal Artery Temporal Artery   Resp: 17 16 18 16    Height:       Weight:       SpO2: 94% 97% 97% 100%        Readmission:   Hospital Outpatient Visit on 02/18/2014   Component Date Value Range Status   . ABO Rh 02/18/2014 A POS   Final   . AB Screen Gel 02/18/2014 NEG   Final        Coagulation Profile: No results found for this basename: PT,INR,PTT in the last 168 hours       Medications:   Current Facility-Administered Medications   Medication  Dose Route Frequency Last Rate Last Dose   . 0.9%  NaCl infusion   Intravenous Continuous 100 mL/hr at 02/19/14 1416     . acetaminophen (TYLENOL) tablet 650 mg  650 mg Oral Q4H PRN   650 mg at 02/21/14 1112   . carvedilol (COREG) tablet 12.5 mg  12.5 mg Oral BID Meals   12.5 mg at 02/21/14 0820   . clonazePAM (KlonoPIN) tablet 1 mg  1 mg Oral QHS   1 mg at 02/20/14 2223   . dextrose (GLUCOSE) 40 % oral gel 15 g of glucose  15 g of glucose Oral PRN       . dextrose 50 % bolus 25 mL  25 mL Intravenous PRN       . diazepam (VALIUM) tablet 5 mg  5 mg Oral Q6H PRN   5 mg at 02/21/14 0236   . diphenhydrAMINE (BENADRYL) capsule 25 mg  25 mg Oral Q6H PRN       . DULoxetine (CYMBALTA) DR capsule 30 mg  30 mg Oral BID   30 mg at 02/21/14 0922   . famotidine (PEPCID) tablet 40 mg  40 mg Oral QHS   40 mg at 02/20/14 2223   . glucagon (rDNA) (GLUCAGEN) injection 1 mg  1 mg Intramuscular PRN       . hydrochlorothiazide (HYDRODIURIL) tablet 25 mg  25 mg Oral QAM   25 mg at 02/21/14 0820   . HYDROmorphone (DILAUDID) tablet 6 mg  6 mg Oral Q4H PRN   6 mg at 02/21/14 0616   . insulin aspart (NovoLOG) injection 1-3 Units  1-3 Units Subcutaneous QHS and 0300 PRN   1 Units at 02/20/14 2225   . insulin aspart (NovoLOG) injection 1-5 Units  1-5 Units Subcutaneous TID AC PRN   1 Units at 02/21/14 1242   . lisinopril (PRINIVIL,ZESTRIL) tablet 20 mg  20 mg Oral Daily   20 mg at 02/20/14 1024   . ondansetron (ZOFRAN) injection 4 mg  4 mg Intravenous Q8H PRN       . oxyCODONE (OxyCONTIN) 12 hr tablet 30 mg  30 mg Oral TID   30 mg at 02/21/14 0922   . pantoprazole (PROTONIX) EC tablet 40 mg  40 mg Oral BID AC   40  mg at 02/21/14 0820   . phenol-menthol (CEPASTAT) lozenge 1 lozenge  1 lozenge Buccal PRN       . senna-docusate (PERICOLACE) 8.6-50 MG per tablet 1 tablet  1 tablet Oral BID   1 tablet at 02/19/14 1706   . spironolactone (ALDACTONE) tablet 25 mg  25 mg Oral QAM   25 mg at 02/21/14 0820   . [COMPLETED] vancomycin (VANCOCIN) 1 g in  sodium chloride 0.9 % 250 mL IVPB  1 g Intravenous Q12H 250 mL/hr at 02/21/14 0616 1 g at 02/21/14 0616   . zolpidem (AMBIEN) tablet 5 mg  5 mg Oral QHS PRN   5 mg at 02/20/14 2223        CBC review:     Lab 02/20/14 0657   WBC 4.87   HGB 10.0*   HCT 32.0*   PLT 195   MCV 92.0   RDW 16*   NEUTRO --   LYMPHOCYTESA --   EOSINOPHILSA --   IMMATUREGRAN --   NEUTROABS --   ABSOLUTEIMMA --        Chem Review:    Lab 02/20/14 0657   NA 140   K 4.2   CL 103   CO2 29   BUN 9.4   CREAT 0.6   GLU 122*   CA 8.4*   MG --   PHOS --   BILITOTAL --   AST --   ALT --   ALKPHOS --        Labs:     Results     Procedure Component Value Units Date/Time    Glucose Whole Blood - POCT [[id number]]  (Abnormal) Collected:02/21/14 1140     POCT - Glucose Whole blood 169 (H) mg/dL [id number]:14/78/29 5621    Glucose Whole Blood - POCT [[id number]]  (Abnormal) Collected:02/21/14 0715     POCT - Glucose Whole blood 126 (H) mg/dL [id number]:41/32/44 0102    Glucose Whole Blood - POCT [[id number]]  (Abnormal) Collected:02/20/14 2126     POCT - Glucose Whole blood 158 (H) mg/dL [id number]:56/38/75 6433    Glucose Whole Blood - POCT [[id number]]  (Abnormal) Collected:02/20/14 1709     POCT - Glucose Whole blood 118 (H) mg/dL SAYTKZS:11/13/30 3557        Rads:   Radiological Procedure reviewed.  Radiology Results (24 Hour)     ** No Results found for the last 24 hours. **          Time spent for evaluation, management and coordination of care:   25 min          Signed by: Julien Girt  02/21/2014 1:29 PM

## 2014-02-21 NOTE — Plan of Care (Signed)
Problem: Safety  Goal: Patient will be free from injury during hospitalization  Outcome: Progressing  Pt demonstrated proper use of call bell prior to getting OOB. Hourly rounding performed    Problem: Pain  Goal: Patient's pain/discomfort is manageable  Outcome: Progressing  Pt's pain is being managed via PO pain meds    Problem: Day 1 Post-op- Lumbar Discectomy/Laminectomy  Goal: Mobility/activity is maintained at optimum level for patient  Outcome: Progressing  Pt ambulates to and from bathroom with front wheel walker and supervision

## 2014-02-21 NOTE — PT Progress Note (Signed)
Physical Therapy Note    [facility]  [address]  [place], [place]. [address]    Department of Rehabilitation  [phone]    Physical Therapy Daily Treatment Note    Patient: Patricia [name]    MRN#: [id number]     Time of Treatment: Start Time: 0915 Stop Time: 0945 Time Calculation (min): 30 min    PT Visit Number: 3    Patient's medical condition is appropriate for Physical Therapy intervention at this time.    Precautions and Contraindications: spine  Precautions  Precaution Instructions Given to Patient: Yes  Spinal Precautions: no bending;no twisting;no lifting    Assessment:   Assessment: Decreased LE strength;Decreased functional mobility;Decreased balance;Gait impairment Prognosis: Good;With continued PT status post acute discharge   Progress: Improving as expected     Patient Goal: to go home today      Plan:   Continue with Physical Therapy services to address core control with functional activities. Focus next session on standing stability.  Treatment/Interventions: Exercise;Gait training;Neuromuscular re-education;Functional transfer training;LE strengthening/ROM;Endurance training;Patient/family training   PT Frequency: follow-up visit only     Discharge Recommendation: Home with home health PT;Home with outpatient PT  Equipment Recommendations: DME Recommended for Discharge: Front wheel walker        Subjective: Patient is agreeable to participation in the therapy session. Nursing clears patient for therapy.   Pain Assessment  Pain Assessment: Numeric Scale (0-10)  Pain Score: 5-moderate pain  Pain Location: Incision  Pain Orientation: Lower  Pain Descriptors: Headache;Sore  Pain Frequency: Increases with movement  Effect of Pain on Daily Activities: mild     Objective:  Observation of Patient/Vital Signs:  Patient is in bed with no medical equipment in place.    Cognition  Arousal/Alertness: Appropriate responses to stimuli  Attention Span: Appears intact  Orientation Level: Oriented  X4  Memory: Appears intact  Following Commands: Follows all commands and directions without difficulty  Safety Awareness: independent  Insights: Fully aware of deficits    Functional Mobility  Supine to Sit: Independent  Scooting to EOB: Stand by assistance  Sit to Stand: Modified independent (Device)  Stand to Sit: Modified independent (Device)  Transfers  Bed to Chair: Modified independent (Device)  Locomotion  Ambulation: modified independent (device);with front-wheeled walker  Ambulation Distance (Feet): 250 Feet  Pattern: decreased cadence;Step through  Stair Management: modified independent (device);stand by assistance;one rail R  Number of Stairs:  (8)  Door Management: stand by assistance    Therapeutic Exercise  Straight Leg Raise: 10  Heelslides: 10  Hip Flexion: 10  Hip Abduction: 10  Ankle Pumps: 10  Strengthening: patient education;isometrics;other (comment) (core control using abdominal bracing)  Bilateral, seated in chair.       Neuro Re-Ed  Neuro Re-ed/Motor Control: standing  Sitting Balance: with instruction;with support  Standing Balance: variable environment/backgrounds;patient education;dynamic gait training     Treatment Activities:  PT treatment, gait training to avoid trunk lean, facilitate TRA contraction with all transfers, standing and ambulating. Cues to avoid leaning on walker, improving posture.  Verbal, and tactile cues to avoid bending, and utilize hip and leg muscles for sit to stand. Reviewed car transfer technique.    Educated the patient to role of physical therapy, plan of care, goals of therapy and HEP, safety with mobility and ADLs, energy conservation techniques, spine precautions, discharge instructions, home safety.    Patient left without needs and call bell within reach.   RN notified of session outcome.

## 2014-02-21 NOTE — Progress Notes (Signed)
Recvd call from RN re pnt needing home PT services and walker. RN obtained orders. CM spoke with pnt, no choice for home care, agreeable to [facility], and aware they are subsidiary of [facility]. Willette Cluster, participating provider with [facility] to deliver walker to room prior to discharge. No other home care/discharge needs identified.

## 2020-08-12 IMAGING — CT CT ABDOMEN AND PELVIS WITH CONTRAST
2 of 3 series · 16 of 46 positions shown, 18 images · IV contrast (isovue)
Comparison: None 
The patient's eGFR was calculated to be 70.9 using the i-STAT device.

CT ABDOMEN AND PELVIS WITH CONTRAST, 08/12/2020 [DATE]: 
CLINICAL INDICATION:  Postcolonoscopy pain, status post colonoscopy 2 hours ago, 
abdominal bloating, diabetes 
A search for DICOM formatted images was conducted for prior CT imaging studies 
completed at a non-affiliated media free facility.
TECHNIQUE: The abdomen and pelvis were scanned from lung bases through the 
pubic rami with 100 cc's of Isovue 300 injected intravenously on a 
high-resolution Ct scanner using dose reduction techniques.  Routine MPR 
reconstructions were performed.

[Series 4: abd/pel ax w · axial · 0.95mm/px · z∈[-495,-78]mm · 13 of 161 slices shown, 15 images]
[im 11/161  soft-tissue]
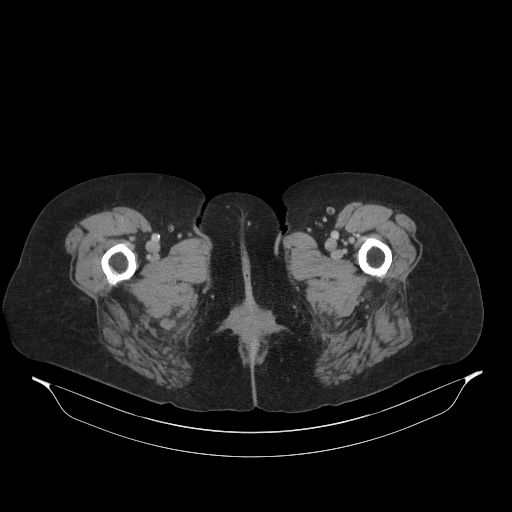
[im 11/161  bone]
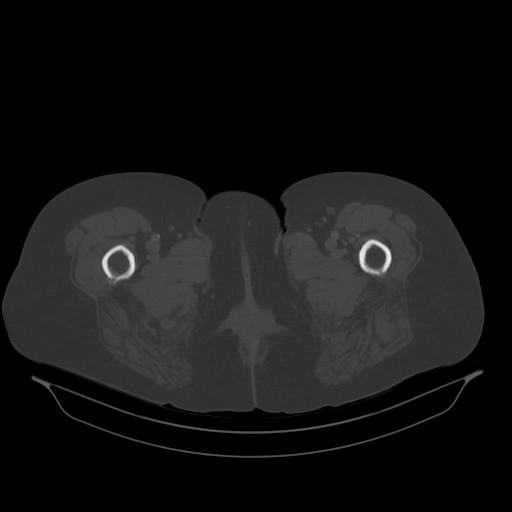
[im 21/161  soft-tissue]
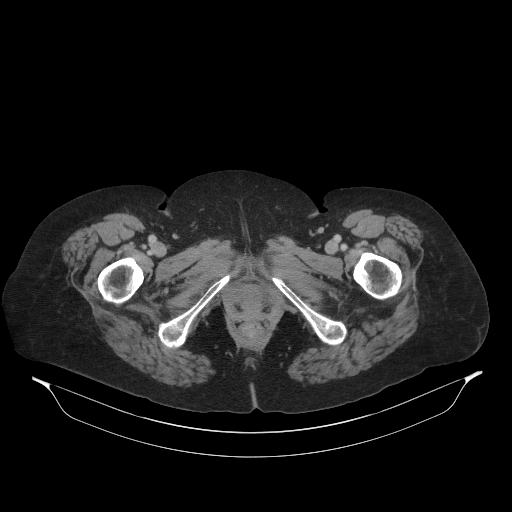
[im 31/161  soft-tissue]
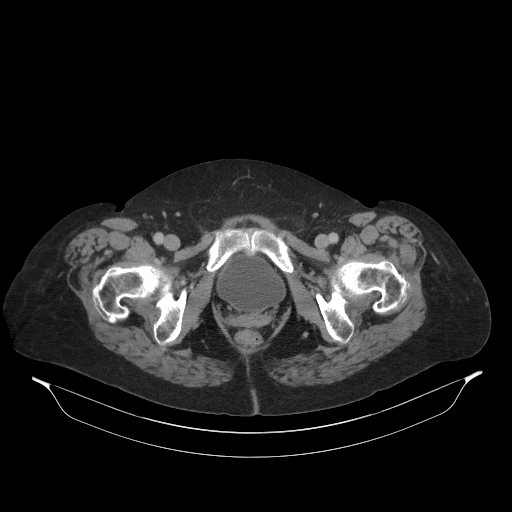
[im 47/161  soft-tissue]
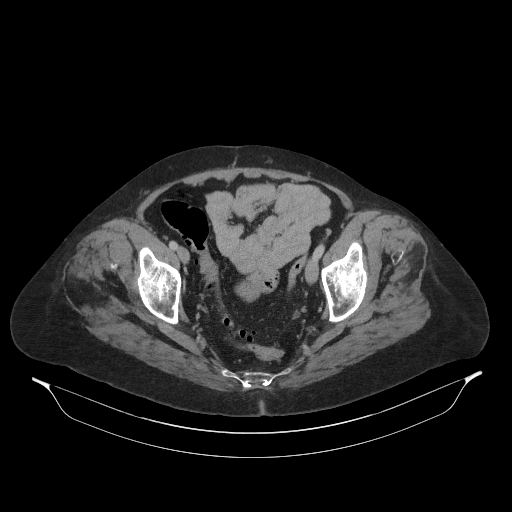
[im 57/161  soft-tissue]
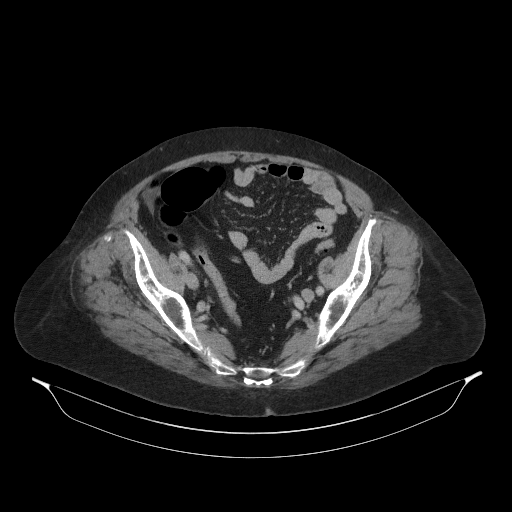
[im 68/161  soft-tissue]
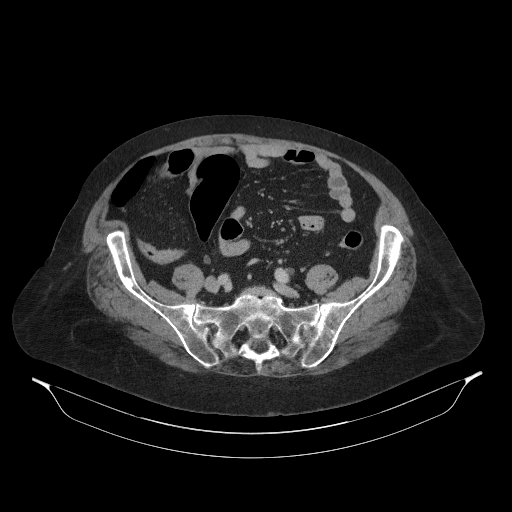
[im 83/161  soft-tissue]
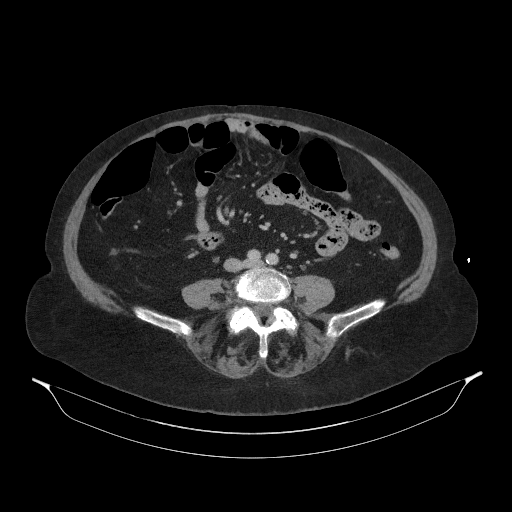
[im 93/161  soft-tissue]
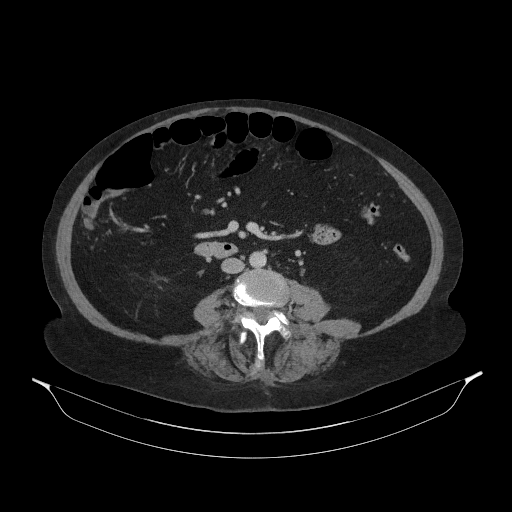
[im 104/161  soft-tissue]
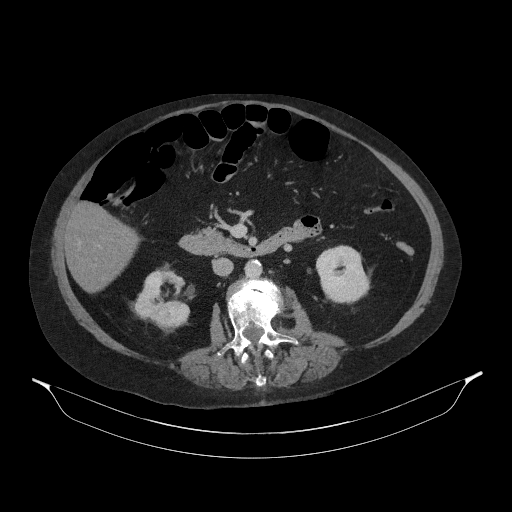
[im 104/161  bone]
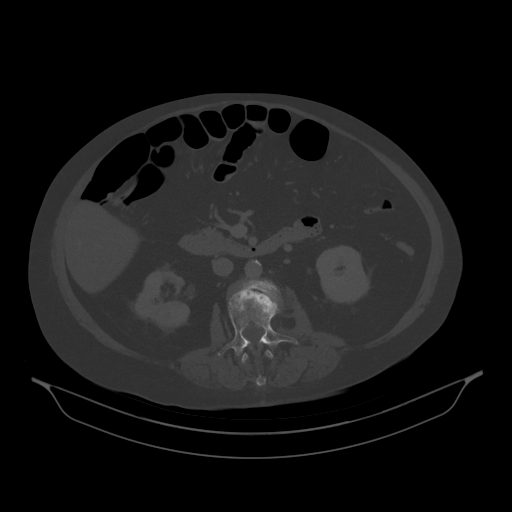
[im 114/161  soft-tissue]
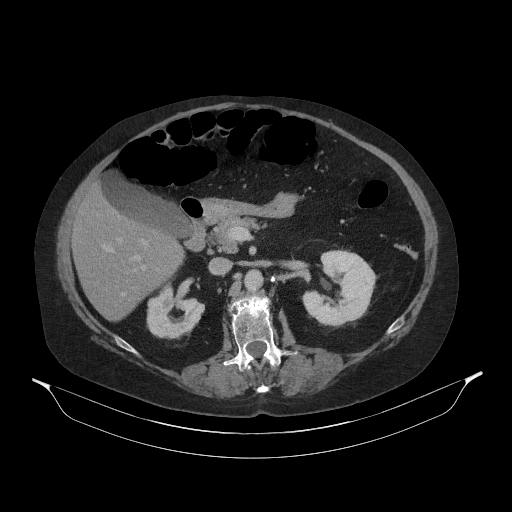
[im 130/161  soft-tissue]
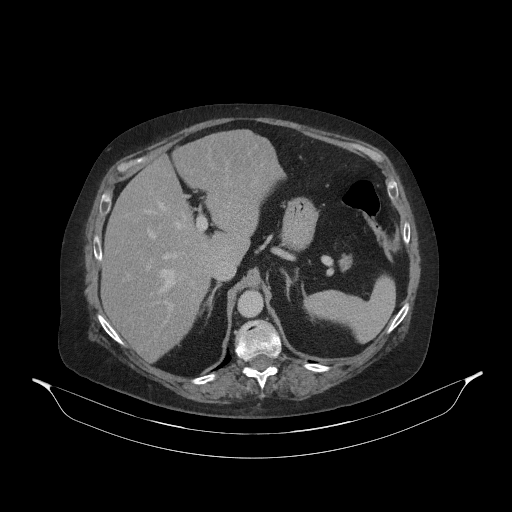
[im 140/161  soft-tissue]
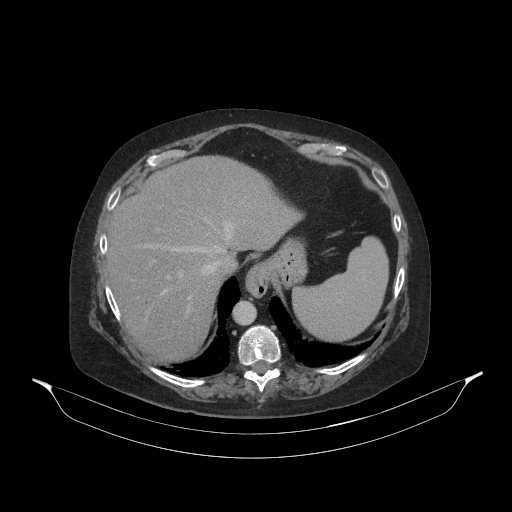
[im 150/161  soft-tissue]
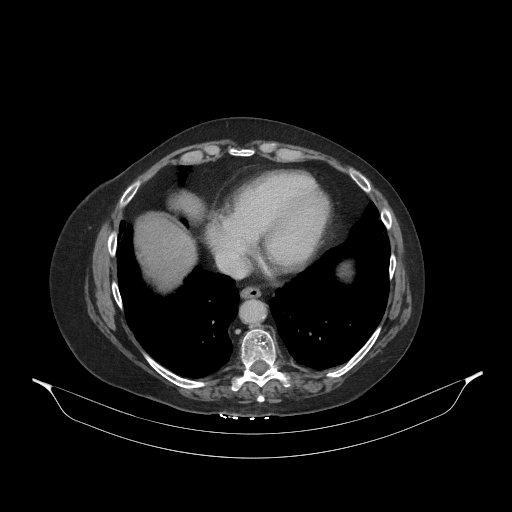

[Series 5: abd/pel cor w · coronal · 1.00mm/px · 3 of 150 slices shown]
[im 50/150  soft-tissue]
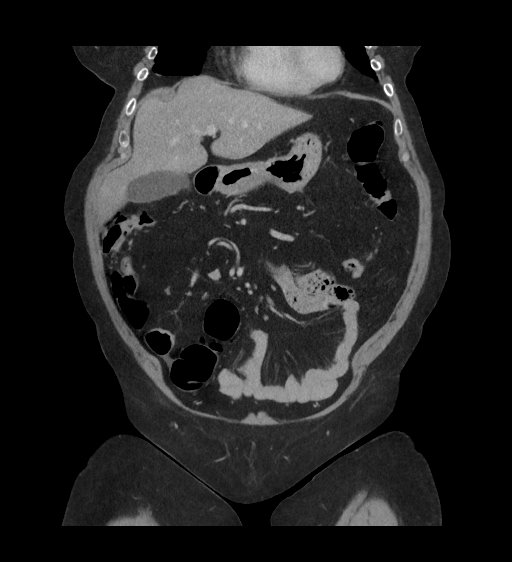
[im 67/150  soft-tissue]
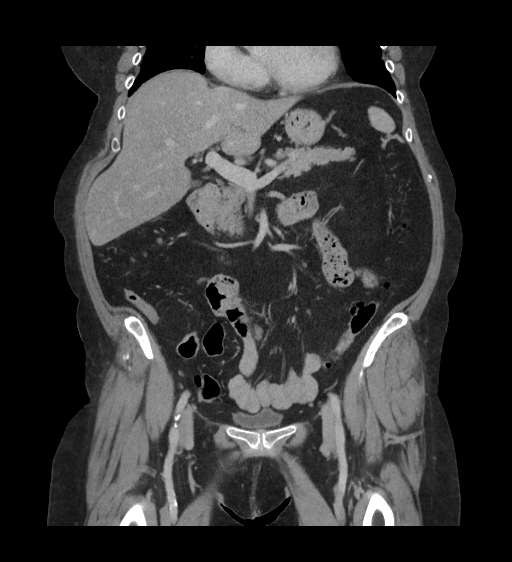
[im 83/150  soft-tissue]
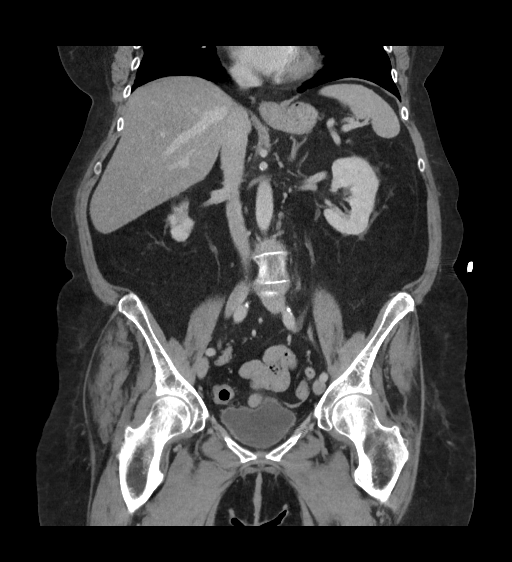

[16 of 46 positions shown; findings below may reference images not displayed]

FINDINGS: Scout image demonstrates air-filled colon. 
LUNG BASES: Hypoventilation the lung bases. Small hiatal hernia 
LIVER: Fatty infiltrated. 
GALLBLADDER AND BILIARY TREE: Distended gallbladder. 
SPLEEN: Subcentimeter hypodensity. Normal sized spleen. 
PANCREAS: Unremarkable. 
ADRENAL GLANDS: Unremarkable. 
KIDNEYS: Lobular atrophic appearance of the LEFT kidney. Both kidneys appear to 
have congenital lobularity. Nonspecific RIGHT perinephric stranding with 
fullness of the RIGHT ureter but no stone. 
BOWEL: Decompressed stomach. Unremarkable small bowel. Decompressed rectum and 
distal sigmoid. Air-filled sigmoid apex. Sigmoid diverticulosis. Descending 
colon diverticulosis. Air-filled transverse colon. Air-filled terminal ileum. No 
pericolonic abnormality. 
VASCULATURE: Atherosclerosis. No aneurysm. 
RETROPERITONEUM: No adenopathy. 
PERITONEUM: No free fluid or free air. No evidence for post colonoscopy 
complication. 
PELVIC ORGANS: Anteverted uterus. Anterior fundal serosal fibroid. Mild pelvic 
floor laxity with mild rectocele in the supine position. 
URINARY BLADDER: Unremarkable. 
OSSEOUS STRUCTURES: Degenerative spondylosis and arthritis. 
SOFT TISSUES: Unremarkable.
IMPRESSION: 1.  No evidence for post colonoscopy complication. Specifically, no evidence for 
perforation. Air-filled colon. No inflammation. 
2.  Atrophic appearing RIGHT kidney. Congenitally lobular kidneys bilaterally. 
3.  Mild pelvic floor laxity with mild prominent distal ureters but no overt 
hydronephrosis. 
4.  Fatty liver. 
5.  Mild hypoventilation of both lung bases. 
Findings are called to and discussed with Dr. Marci on 08/12/2020 at 0566 hours. 
RADIATION DOSE REDUCTION: All CT scans are performed using radiation dose 
reduction techniques, when applicable.  Technical factors are evaluated and 
adjusted to ensure appropriate moderation of exposure.  Automated dose 
management technology is applied to adjust the radiation doses to minimize 
exposure while achieving diagnostic quality images.

## 2021-03-02 IMAGING — CT CT CHEST/ABDOMEN/PELVIS WITH CONTRAST
2 of 5 series · 13 of 42 positions shown, 15 images · IV contrast (APPLIED)
Comparison: CT abdomen pelvis June 12, 2020.

CT CHEST/ABDOMEN/PELVIS WITH CONTRAST, 03/02/2021 [DATE]: 
CLINICAL INDICATION:  Unintentional weight loss. 
A search for DICOM formatted images was conducted for prior CT imaging studies 
completed at a non-affiliated media free facility.
TECHNIQUE: The chest, abdomen and pelvis were scanned from base of neck through 
the pubic rami with 100 mL of Isovue 300 injected intravenously on a high 
resolution low dose CT scanner. Routine MPR and MIP 3D renderings were 
reconstructed on an independent workstation with concurrent physician 
supervision.

[Series 6: cor cor · coronal · 0.62mm/px · 3 of 139 slices shown]
[im 35/139  soft-tissue]
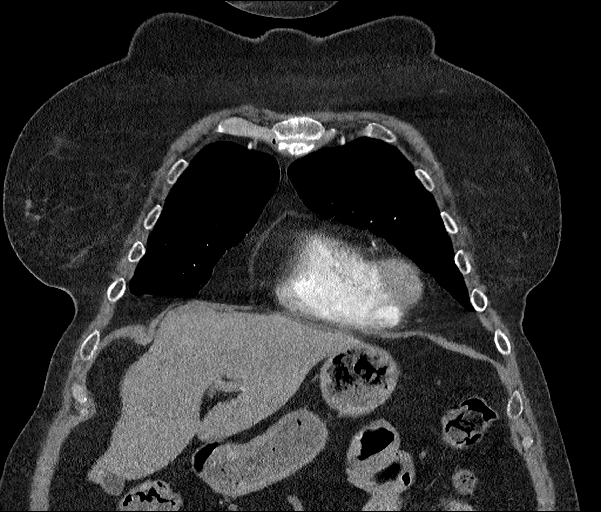
[im 70/139  soft-tissue]
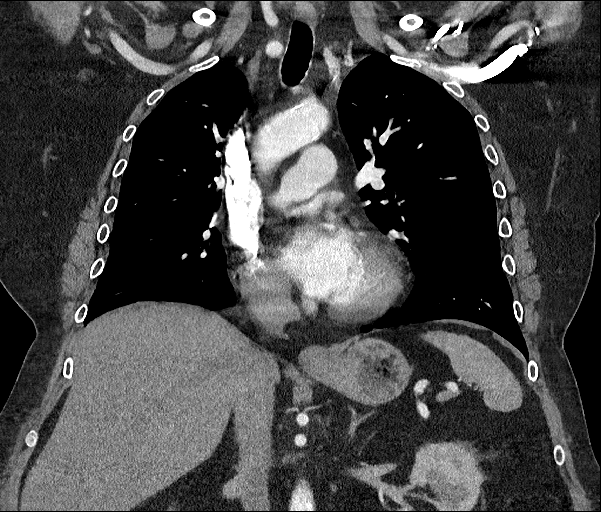
[im 104/139  soft-tissue]
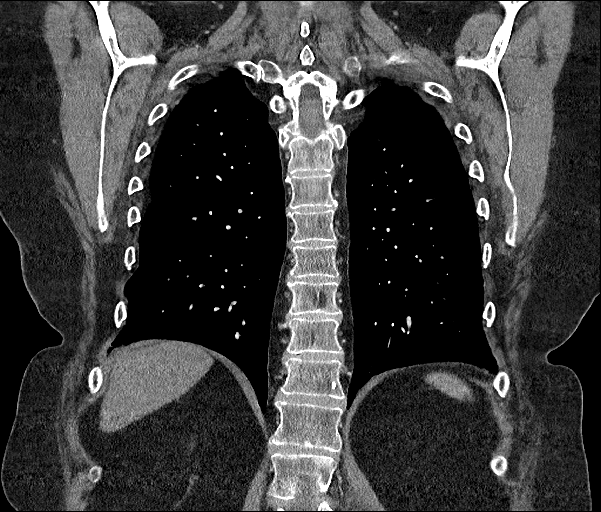

[Series 9: axial portal · axial · portal-venous · 0.89mm/px · z∈[-653,-227]mm · 10 of 166 slices shown, 12 images]
[im 12/166  soft-tissue]
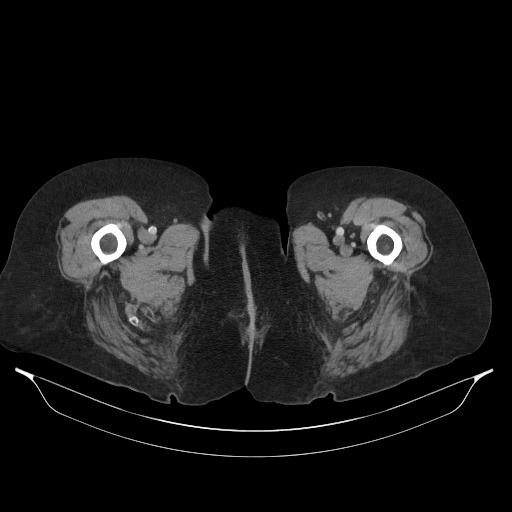
[im 12/166  bone]
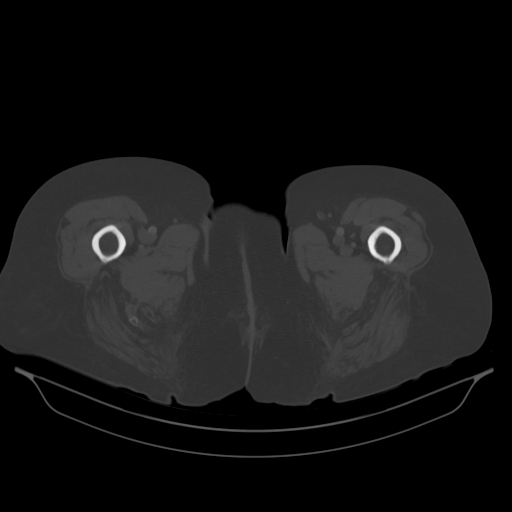
[im 24/166  soft-tissue]
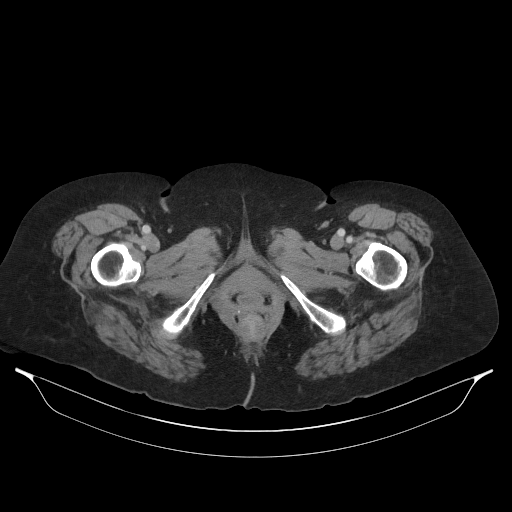
[im 48/166  soft-tissue]
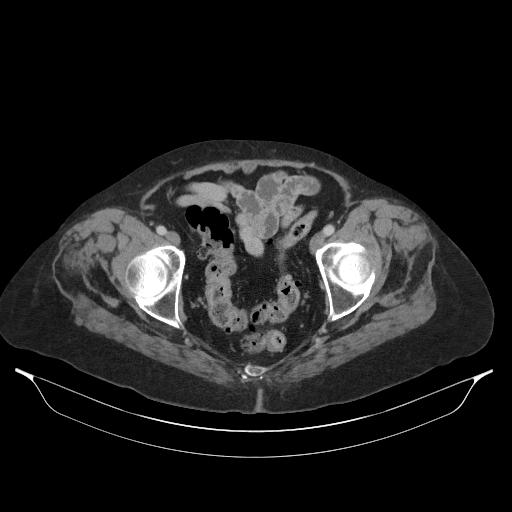
[im 59/166  soft-tissue]
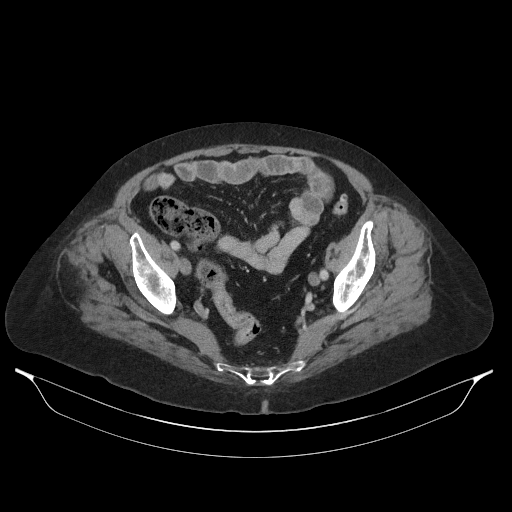
[im 71/166  soft-tissue]
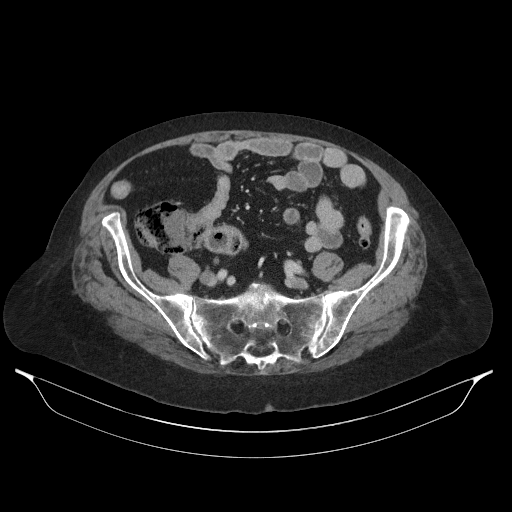
[im 95/166  soft-tissue]
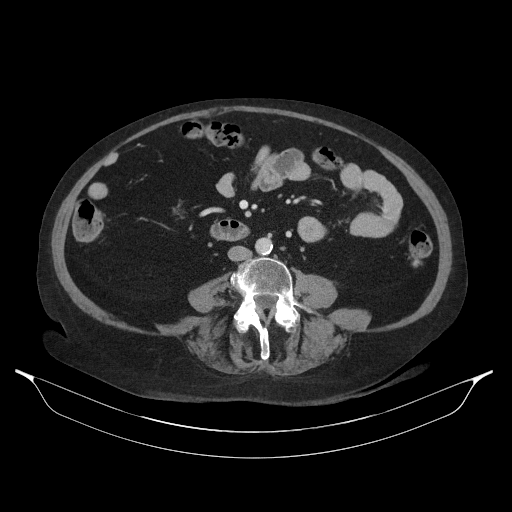
[im 107/166  soft-tissue]
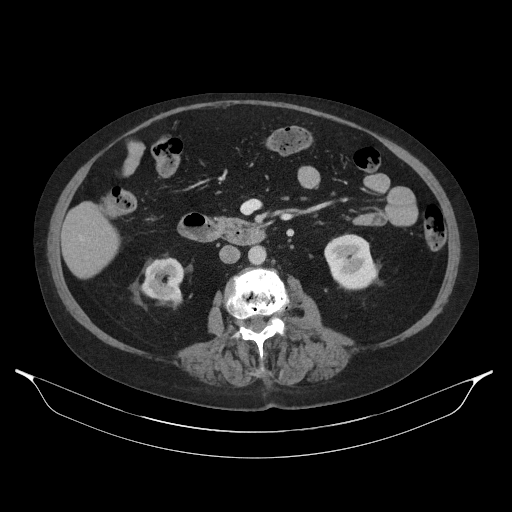
[im 118/166  soft-tissue]
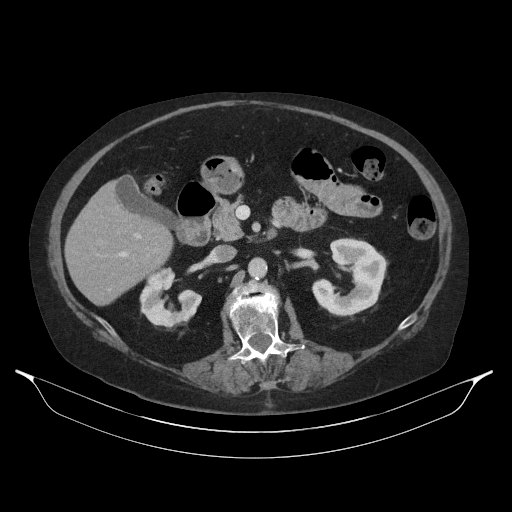
[im 142/166  soft-tissue]
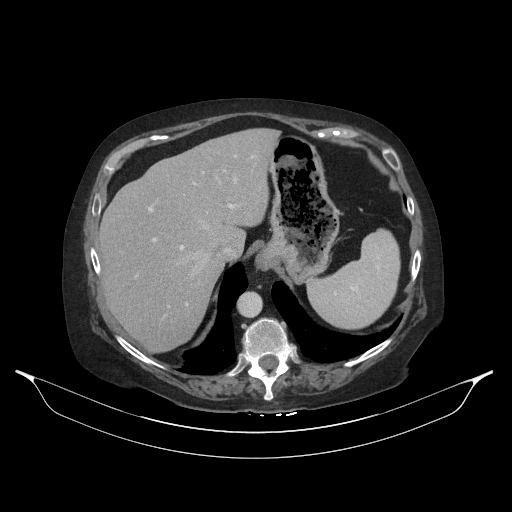
[im 142/166  bone]
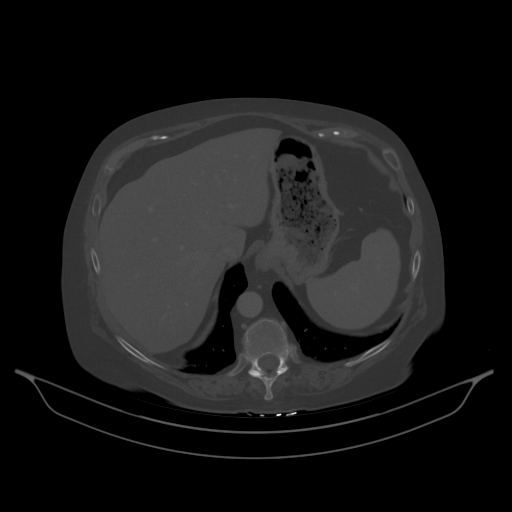
[im 154/166  soft-tissue]
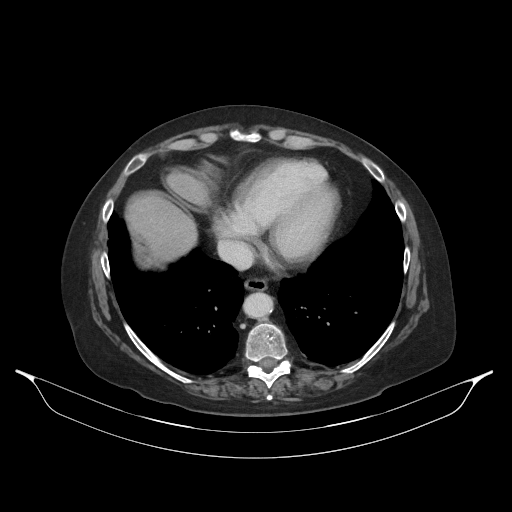

[13 of 42 positions shown; findings below may reference images not displayed]

FINDINGS: CT chest: 
There is a noncalcified 11 x 7 mm nodule in the superior segment of the LEFT 
lower lobe as measured on axial image 68 of series 5. MIP slab images show 
numerous additional bilateral micronodules measuring up to 3 mm, a few of which 
are calcified. No acute pulmonary pathology is found. There is no visible 
centrilobular emphysema or diffuse interstitial lung disease. Incidental dorsal 
subpleural dependent atelectasis and minor basilar subsegmental atelectasis are 
present. The trachea and major airways are unremarkable. There is no pleural 
effusion. The heart is mildly enlarged without significant coronary artery 
calcifications. Main pulmonary artery is normal in caliber. No incidental 
pulmonary emboli are found. The thoracic aorta is normal in overall caliber. The 
esophagus is unremarkable. No pathologically enlarged mediastinal, hilar or 
axillary lymph nodes are found. The breast tissues are unremarkable. Limited 
imaging of the inferior neck shows partial surgical absence of the RIGHT 
thyroid. Degenerative changes are in the shoulders and spine. 
CT abdomen pelvis: 
No hepatic pathology is found. The gallbladder and biliary tract are normal. The 
pancreas is normal. A 1 cm hypodense area in the upper pole of the spleen is 
likely incidental benign pathology and is unchanged from the prior exam. The 
spleen is otherwise unremarkable. Both of the adrenal glands are normal. There 
is moderate generalized atrophy of the RIGHT kidney. The kidneys are otherwise 
unremarkable without evidence for clinically significant active pathology. 
Urinary bladder is normal in volume. There is prominent pelvic floor prolapse 
seen on sagittal images. Uterus is normal in size for patient age. Calcified 
pelvic venous phleboliths are present. There is mild chronic diverticular 
disease involving the LEFT colon. The colon is otherwise unremarkable. No small 
bowel pathology is found. The stomach is unremarkable. Abdominal aorta is normal 
in caliber. Mild scattered atherosclerotic calcifications are present. No 
abnormal retroperitoneal or pelvic sidewall lymph nodes are found. There are 
some slightly prominent RIGHT mesenteric lymph nodes which have remained stable 
since August 12, 2020 consistent with incidental benign pathology. The superior 
mesenteric vein and portal vein are patent. There is no free intraperitoneal 
fluid. There is no abdominal wall hernia. Degenerative changes are in the lumbar 
spine with severe degenerative disc disease predominantly at L1-2 and L2-3.
IMPRESSION: 1. No significant finding on this examination is the presence of an 11 x 7 mm 
nodule in the LEFT lower lobe. This is large enough that I would recommend 
follow-up PET imaging. See reference regarding follow-up of pulmonary nodules at 
the end of the body of this report. 
2. Additional pulmonary micronodules are likely incidental benign pathology but 
will need follow-up in 12 months. 
3. Pronounced pelvic floor prolapse is noted. Moderate RIGHT renal atrophy is 
present. 
REFERENCE: 
Recommendations for pulmonary nodule follow-up according to [HOSPITAL] 
Guidelines. 
Solitary nodule size: < 6 mm 
*Low-risk patients: No followup needed. 
*High-risk patients: Optional CT at 12 months. 
Solitary Nodule size: 6-8 mm 
*Low-risk patients: Followup at 6-12 months, then consider further follow-up at 
18-24 months. 
*High-risk patients: Initial followup CT at 6-12 months and then at 18-24 months 
if no change. 
SOLITARY NODULE SIZE:> 8 MM 
*EITHER LOW OR HIGH RISK: 
*CONSIDER FOLLOW-UP CT AT 3 MONTHS, AND/OR CT PET, AND/OR BIOPSY. 
NOTE: 
Low risk patients: minimal or absent history of smoking and or other known risk 
factors. 
High risk patients: history of smoking or of other known risk factors (e.g. 
first degree relative with lung cancer, or exposure to asbestos, radon uranium) 
If a nodule up to 8mm is partly solid or is ground glass further follow up is 
required after 24 months to exclude possible slow growing adenocarcinoma (EYES) 
Size is average of length and width. 
RADIATION DOSE REDUCTION: All CT scans are performed using radiation dose 
reduction techniques, when applicable.  Technical factors are evaluated and 
adjusted to ensure appropriate moderation of exposure.  Automated dose 
management technology is applied to adjust the radiation doses to minimize 
exposure while achieving diagnostic quality images.

## 2021-03-22 IMAGING — CT PET CT SCAN TUMOR IMAGING SKULL TO THIGH
6 series · 25 of 25 positions shown · non-contrast
Comparison: CT chest abdomen pelvis March 02, 2021

PET CT SCAN TUMOR IMAGING SKULL TO THIGH, 03/22/2021 [DATE]: 
CLINICAL INDICATION:  Multiple pulmonary nodules, evaluate for malignancy. 11 mm 
left lower lobe nodule.
TECHNIQUE: A dose of 12.5 millicuries of 18-FDG was administered intravenously 
and skull to thigh PET scanning was performed at 69 minutes. Tomographic scans 
were reconstructed in axial, coronal, and sagittal projections. The data was 
reconstructed into a three-dimensional volume rendered images and reviewed in a 
rotational cine loop. Serum blood glucose at the time of injection was 108 
mg/dl.

[Series 201: body-low dose ct, idose (4) · axial · 4.0mm · 1.17mm/px · z∈[-1189,-257]mm · 4 of 234 slices shown]
[im 1/234]
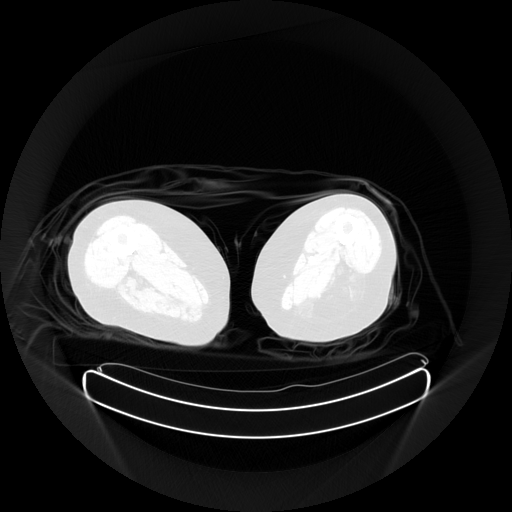
[im 78/234]
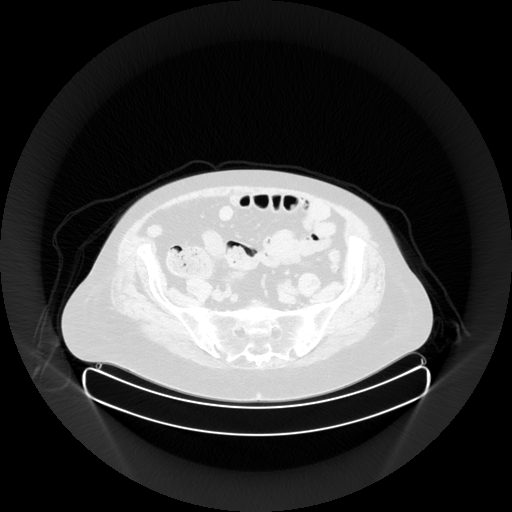
[im 156/234]
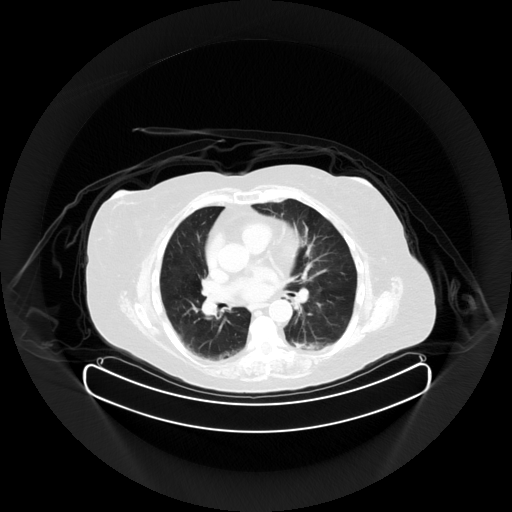
[im 234/234]
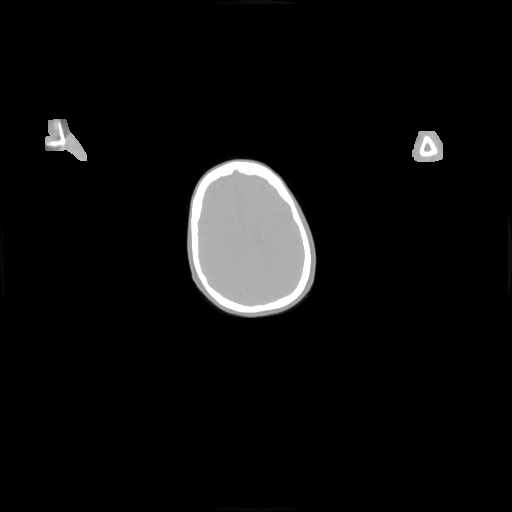

[Series 202: lung ct, idose (2) · axial · 2.0mm · 1.17mm/px · z∈[-758,-412]mm · 3 of 174 slices shown]
[im 1/174]
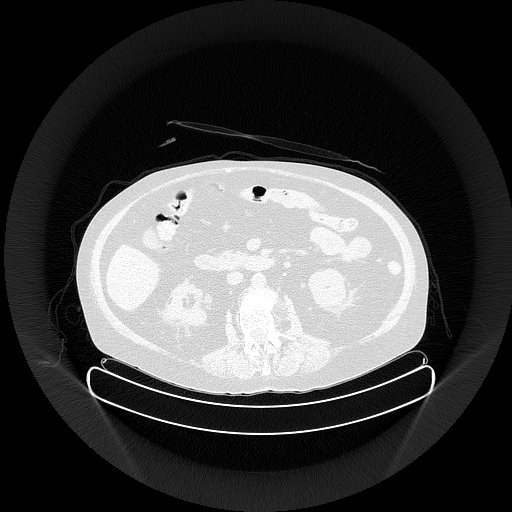
[im 87/174]
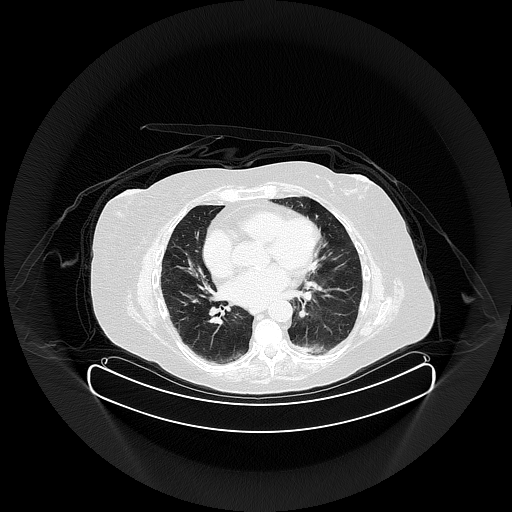
[im 174/174]
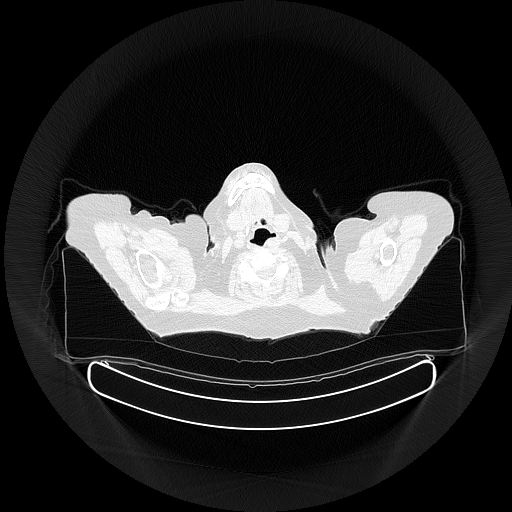

[Series 8787: (wb_nac) body · axial · 4.0mm · 4.00mm/px · z∈[-1189,-257]mm · 5 of 234 slices shown]
[im 1/234]
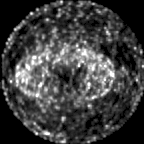
[im 59/234]
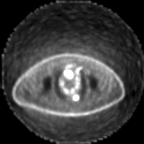
[im 117/234]
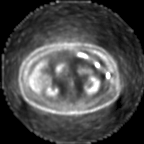
[im 175/234]
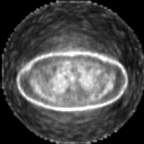
[im 234/234]
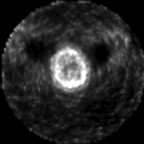

[Series 8789: (wb_ctac) body · axial · 4.0mm · 4.00mm/px · z∈[-1189,-257]mm · 5 of 234 slices shown]
[im 1/234]
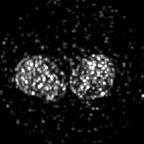
[im 59/234]
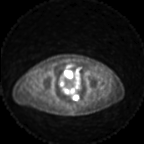
[im 117/234]
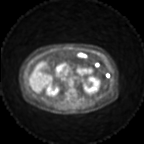
[im 175/234]
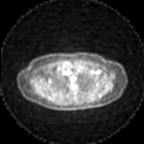
[im 234/234]
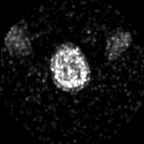

[Series 8799: (detailwb_nac_psf) body · axial · 2.0mm · 2.00mm/px · z∈[-758,-412]mm · 4 of 174 slices shown]
[im 1/174]
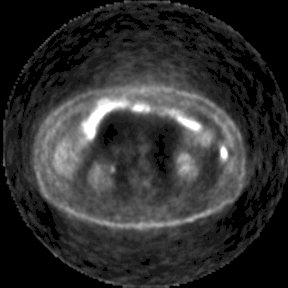
[im 58/174  full-range]
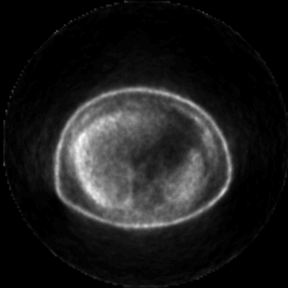
[im 116/174  full-range]
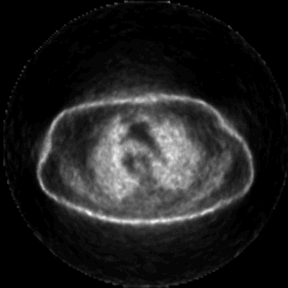
[im 174/174]
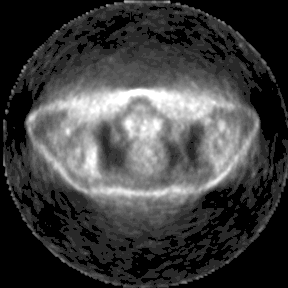

[Series 8800: (detailwb_ctac_psf) body · axial · 2.0mm · 2.00mm/px · z∈[-758,-412]mm · 4 of 174 slices shown]
[im 1/174]
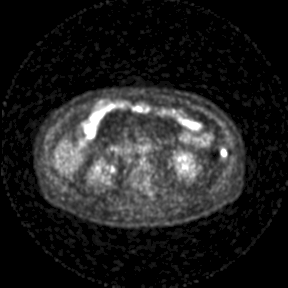
[im 58/174]
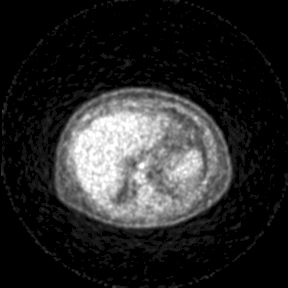
[im 116/174]
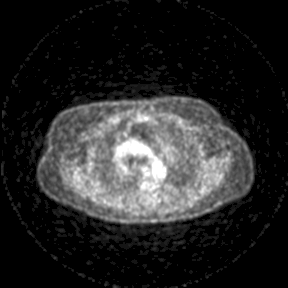
[im 174/174]
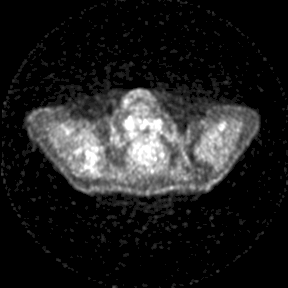

[25 of 25 positions shown; findings below may reference images not displayed]

FINDINGS: There is an 11 mm nodule in the superior segment left lower lobe seen 
on 2 mm CT image 72-73. Maximum SUV is 1.1. This is only minimally apparent on 
the 2 mm fused images. This is most likely benign given this low level activity. 
No other conspicuous pulmonary uptake. There is no mediastinal, axillary or 
supraclavicular activity on the 2 mm fused images and whole body images. There 
is mild activity in the posterior left lung, maximum SUV 3.1 which appears to 
correspond with atelectasis, less likely patchy infiltrate/pneumonitis. 
Correlation advised. There is activity in the colon, without CT correlate. There 
is mild sigmoid and descending colon diverticulosis. There is increased stool 
content in the right colon. No active wall inflammation. There is scarring of 
the right renal cortex. There is physiologic excretion of radiopharmaceutical by 
the urinary system. There is no concerning osseous activity. No abnormal 
activity pertaining to the breasts.
IMPRESSION: There is no significant FDG uptake in the dominant left lower lobe nodule. This 
is most likely granuloma or other benign finding. Further surveillance follow-up 
with CT recommended. 
Mild activity in the posterior left lung appears due to atelectasis, less likely 
recent/resolving pneumonitis. Clinical correlation advised. 
Activity in the colon is most likely physiologic. There is relative increased 
stool in the right colon and mild left colonic diverticulosis. No active wall 
inflammation identified. 
Fatty infiltration of the liver. 
Mild coronary artery calcification. There is mild cardiac enlargement. 
Mild aortic plaque. Degenerative changes.

## 2021-05-05 IMAGING — MR MRI ENTEROGRAPHY W/WO CONTRAST
9 of 17 series · 23 of 48 positions shown · IV contrast (gadavist)
Comparison: CT scan February 2021 and PET scan from March 2021 
Multiplanar, multiecho position MR images of the abdomen were performed with and 
without intravenous contrast enhancement6.4 mL of Gadavist was administered per 
protocol.

________________________________________________________________________________________________ 
******** ADDENDUM #1 ********/n 
CORRECTED TECHNIQUE: Multiplanar, multiecho position MR images of the abdomen 
and pelvis were performed with and without intravenous contrast enhancement6.4 
mL of Gadavist was administered per protocol. In addition, oral Volumen was 
ingested prior to scanning.  3 bottles of 450 mL were administered prior to 
scanning at 2 hours, 1 hour and 30 minutes prior to imaging. Patient was scanned 
on a 3T magnet.  
MRI ENTEROGRAPHY, 05/05/2021 [DATE]: 
CLINICAL INDICATION: History of Crohns disease. Follow-up CT scan. 
The patients eGFR was calculated to be 70.5 using the i-STAT device.

[Series 201: survey-bh · axial · 15.0mm · 1.74mm/px · z∈[+44,+308]mm · 2 of 11 slices shown]
[im 1/11]
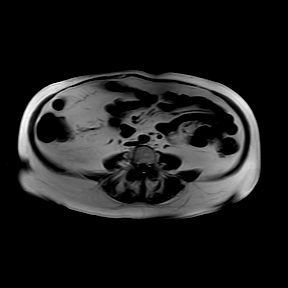
[im 11/11]
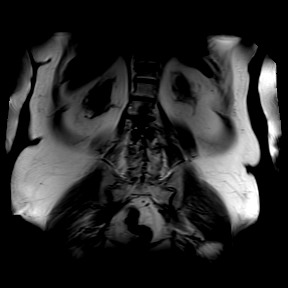

[Series 303: smdixon-in phase · axial · 4.0mm · 0.83mm/px · z∈[+36,+294]mm · 5 of 130 slices shown (1 of 2)]
[im 1/130]
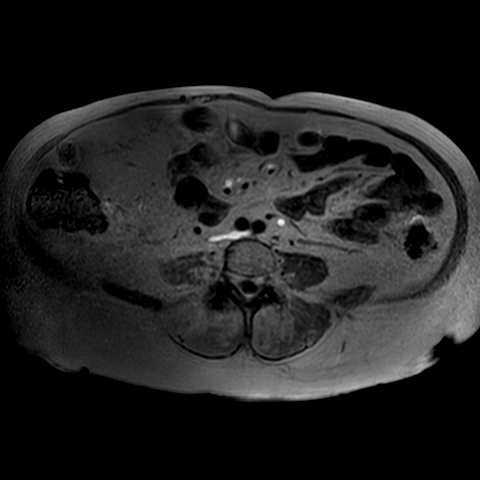
[im 33/130]
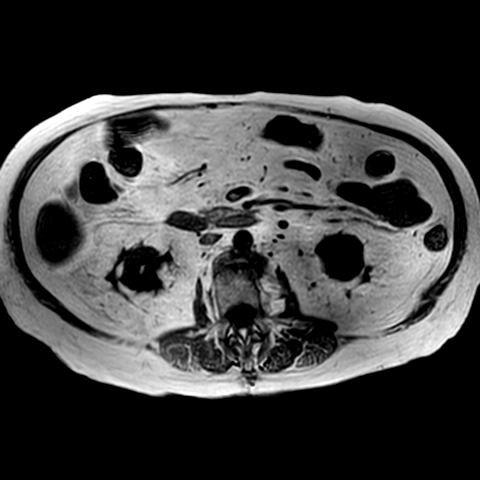
[im 65/130]
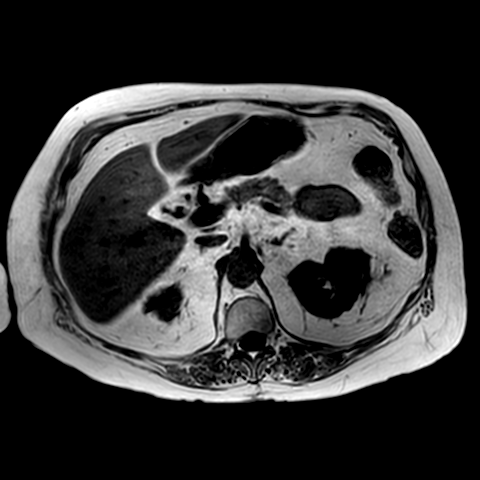
[im 97/130]
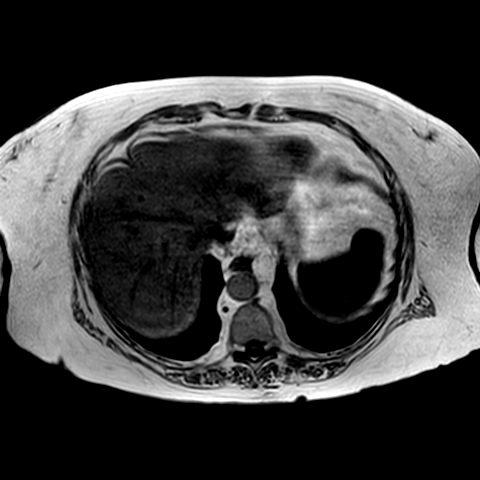
[im 130/130]
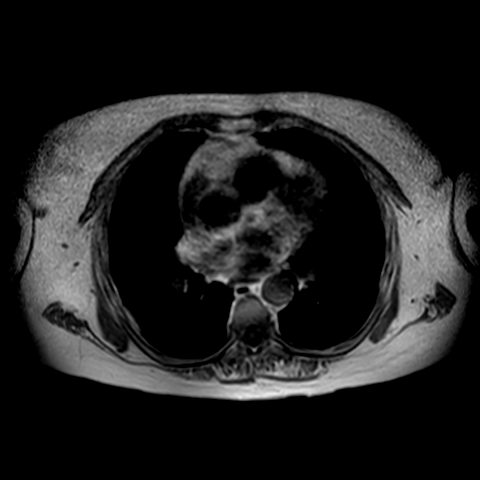

[Series 304: smdixon-out of phase · axial · 4.0mm · 0.83mm/px · z∈[+36,+294]mm · 4 of 130 slices shown (1 of 2)]
[im 1/130]
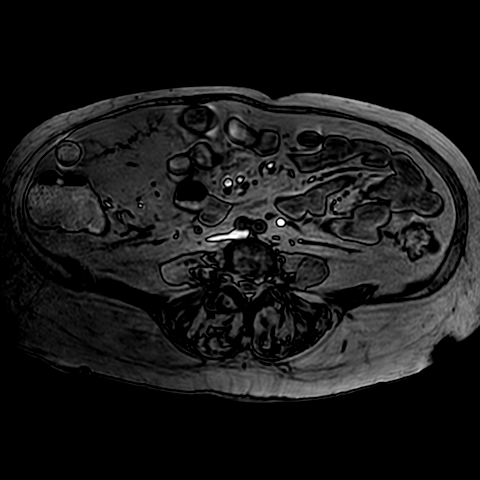
[im 44/130]
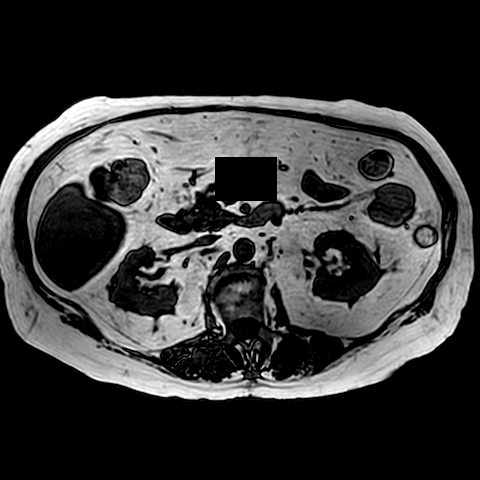
[im 87/130]
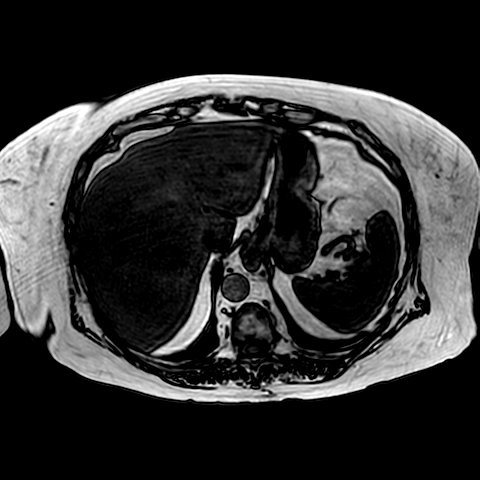
[im 130/130]
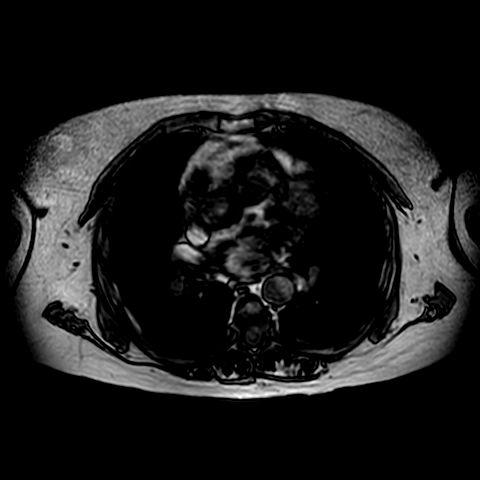

[Series 403: smdixon-in phase · axial · 4.0mm · 0.93mm/px · z∈[-214,+44]mm · 4 of 130 slices shown (2 of 2)]
[im 1/130]
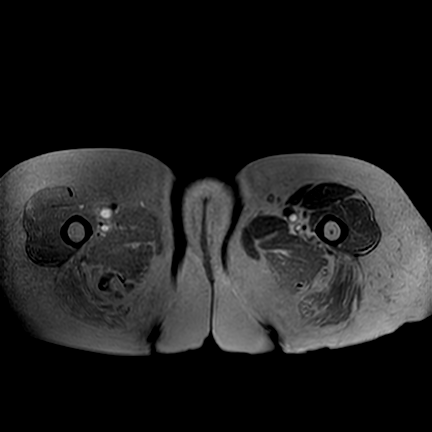
[im 44/130]
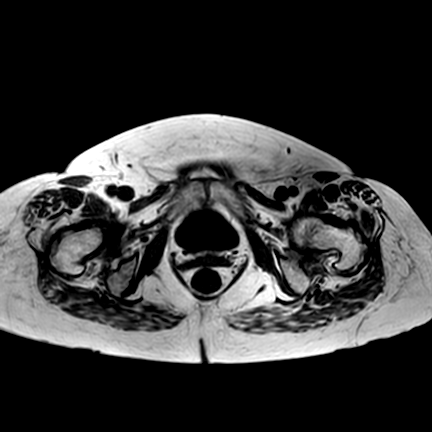
[im 87/130]
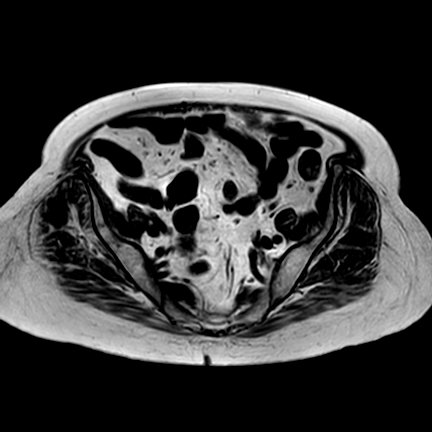
[im 130/130]
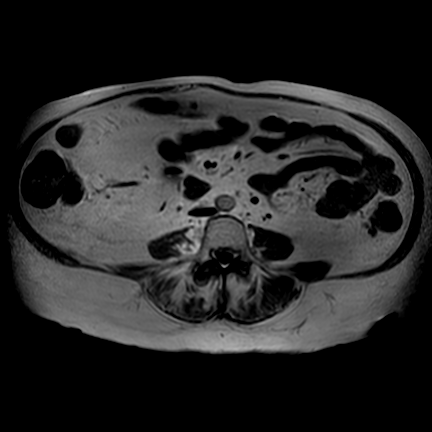

[Series 404: smdixon-out of phase · axial · 4.0mm · 0.93mm/px · z∈[-214,+44]mm · 4 of 130 slices shown (2 of 2)]
[im 1/130]
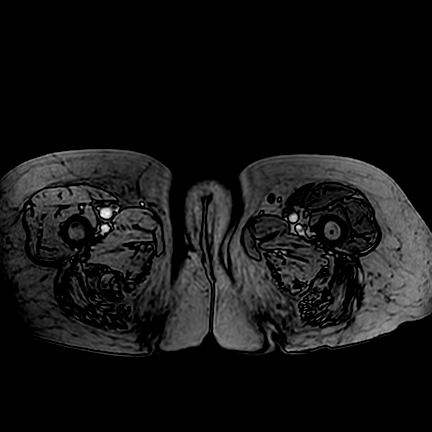
[im 44/130]
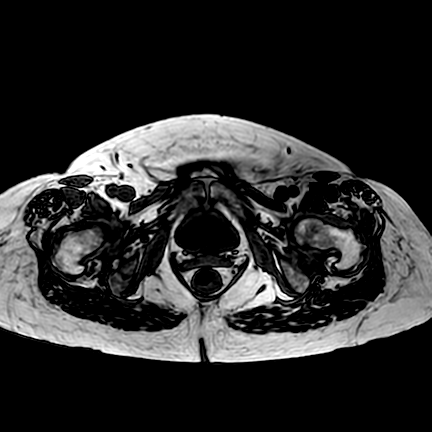
[im 87/130]
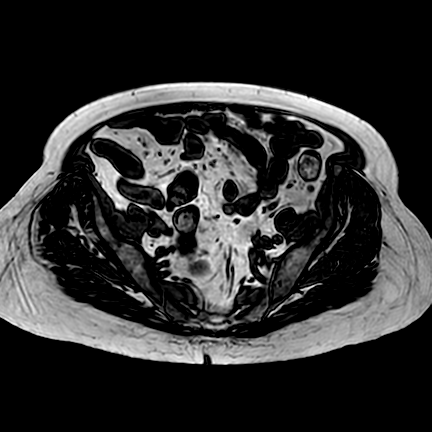
[im 130/130]
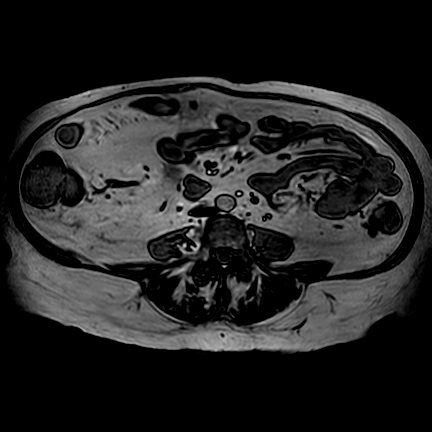

[Series 501: t2w_tse_cor_bh · coronal · 5.5mm · 0.86mm/px · 1 of 38 slices shown]
[im 1/38]
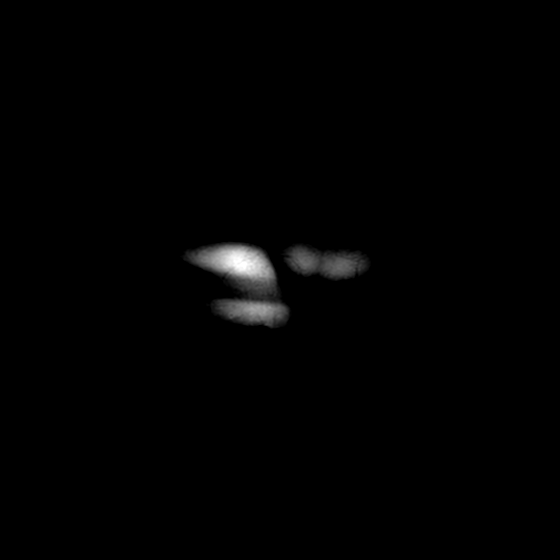

[Series 601: t2w_tse_bh · axial · 5.0mm · 0.78mm/px · 1 of 40 slices shown (1 of 2)]
[im 1/40]
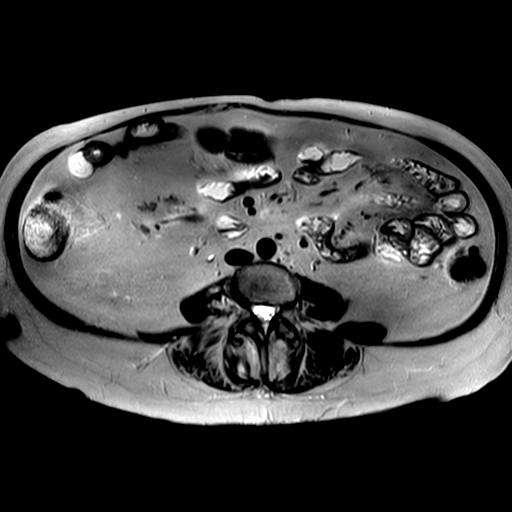

[Series 701: t2w_tse_bh · axial · 5.0mm · 0.78mm/px · 1 of 44 slices shown (2 of 2)]
[im 1/44]
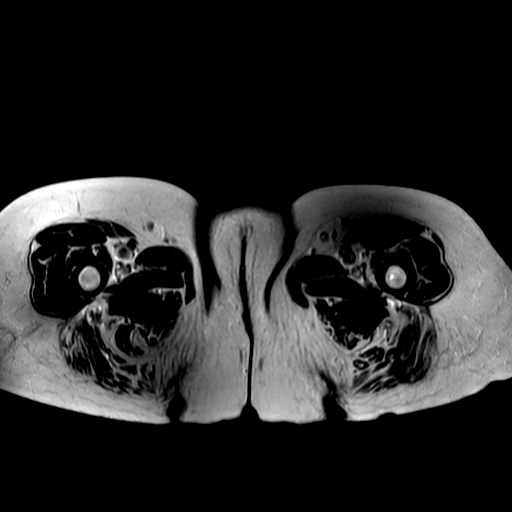

[Series 801: T1 dynamic fat-sat · coronal · non-contrast · 4.0mm · 0.94mm/px · 1 of 117 slices shown]
[im 1/117]
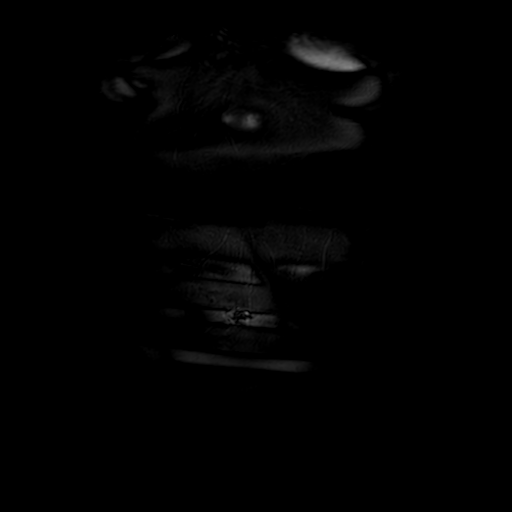

[23 of 48 positions shown; findings below may reference images not displayed]

In addition, oral Volumen was ingested prior to scanning.  3 bottles 
of 450 mL were administered prior to scanning at 2 hours, 1 hour and 30 minutes 
prior to imaging. Patient was scanned on a 3T magnet.
FINDINGS: The liver, spleen, pancreas, adrenal glands and kidneys are unremarkable in 
appearance. No adenopathy. No mass identified. There is a moderate volume of 
stool throughout the colon. I do not see colonic mass. Bladder and uterus are 
unremarkable in appearance. There is an enhancing nodule seen within the small 
bowel. This is thought to be in the mid ileum this is seen on both T2 and 
postcontrast images as this does appear to enhance. Measures approximately 10 mm 
on T2-weighted axial images.
IMPRESSION: Small bowel polyp/nodule. Thought to present in the mid ileum. 
Moderate volume of stool within the colon. Diverticulosis without colonic lesion 
seen elsewhere. 
No adenopathy or mass identified elsewhere.

## 2021-08-24 IMAGING — DX CHEST PA AND LATERAL
1 series · 2 of 2 positions shown · non-contrast
Comparison: None.

________________________________________________________________________________________________ 
CLINICAL INDICATION: Cough.

[Series 1: lateral · U · 0.14mm/px · 2 of 2 slices shown]
[im 1/2]
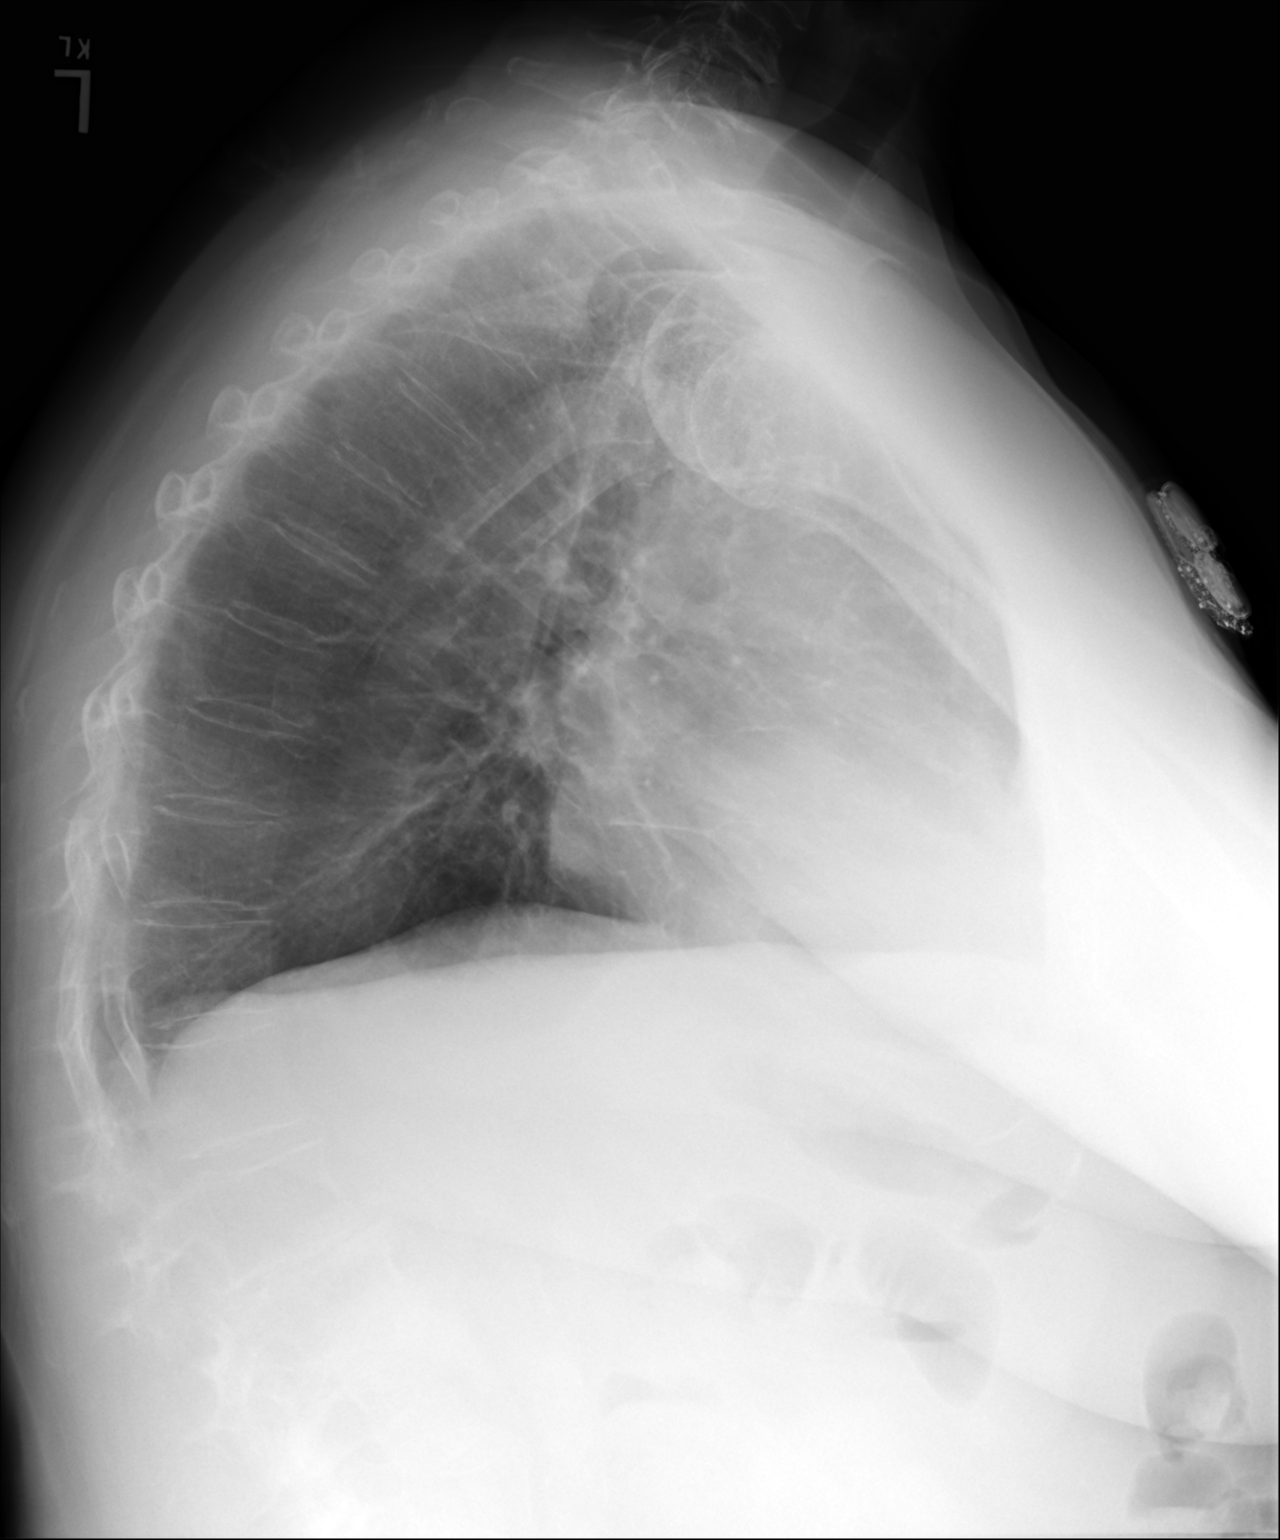
[im 2/2]
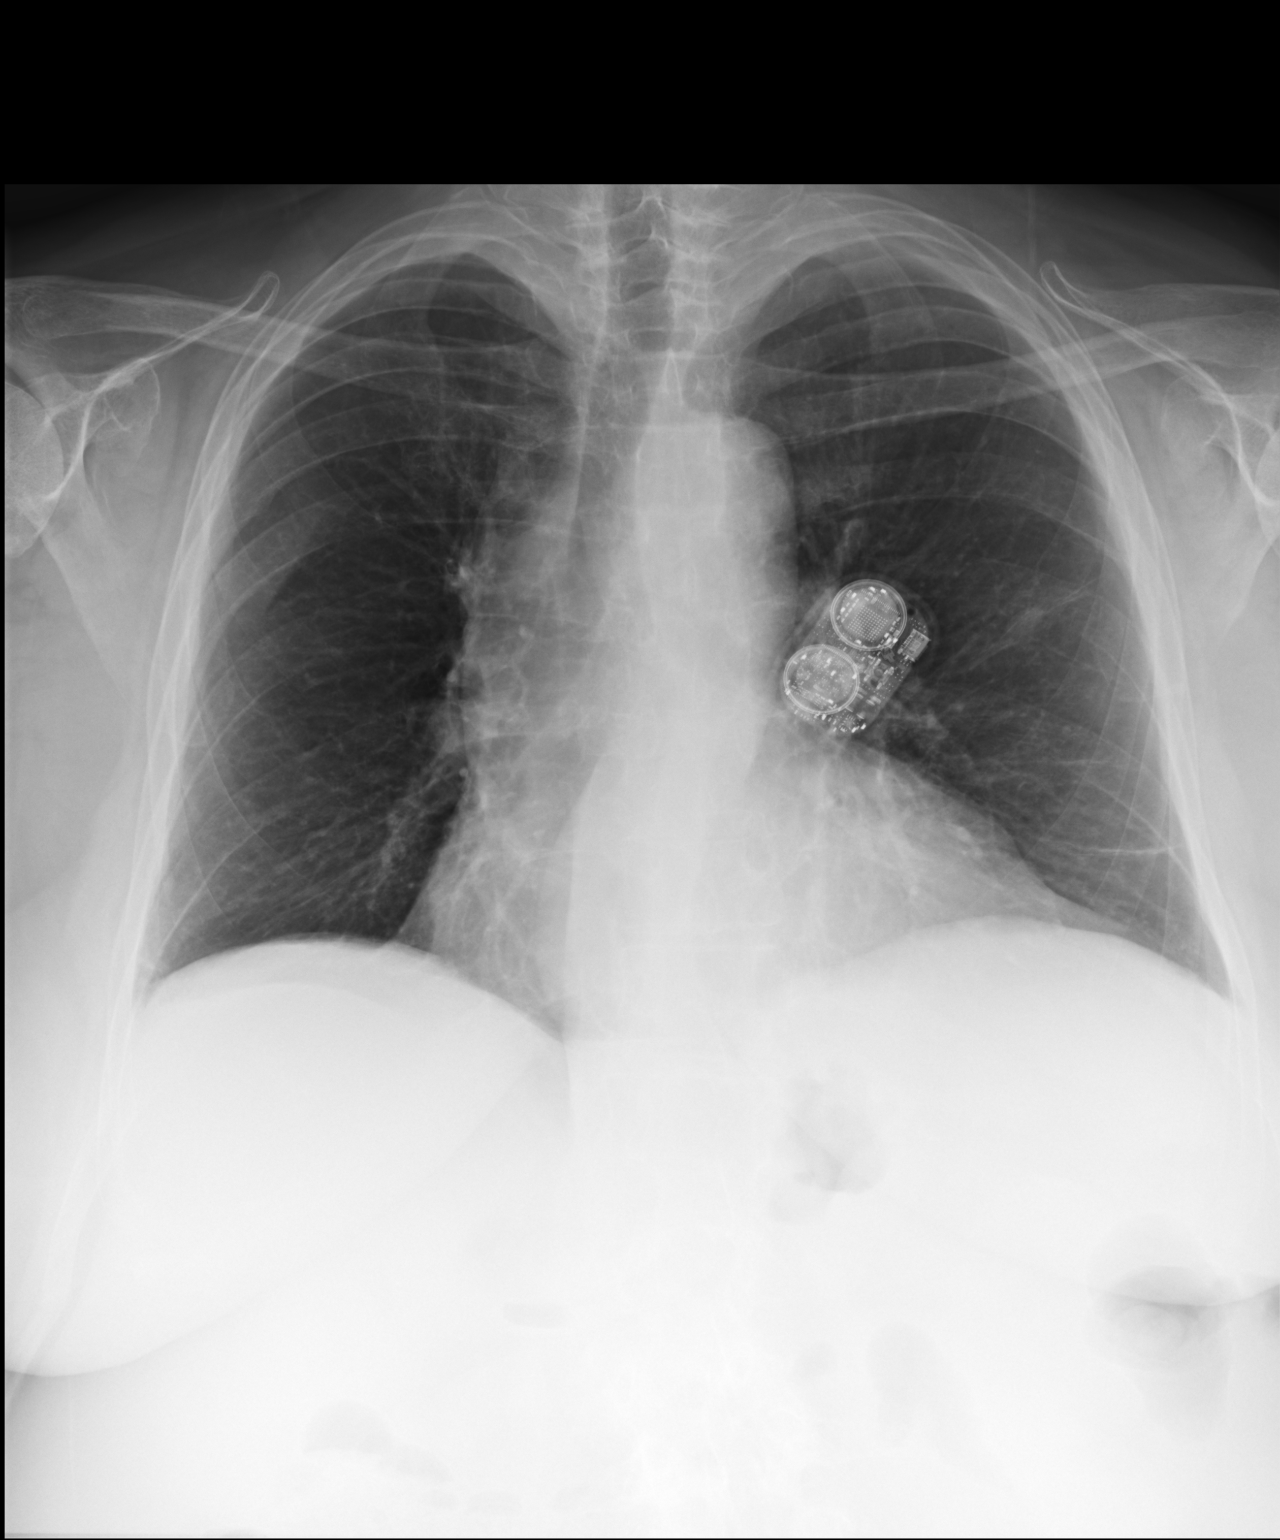

[2 of 2 positions shown; findings below may reference images not displayed]

FINDINGS: Lungs are clear. No consolidation. No effusion. Normal cardiac size 
and pulmonary vascularity. [employer] monitor. No pneumothorax. No acute osseous 
abnormality. Degenerative change.
IMPRESSION: No acute cardiopulmonary findings.

## 2021-09-14 IMAGING — MG MAMMOGRAPHY SCREENING BILATERAL 3[PERSON_NAME]
8 series · 8 of 24 positions shown · non-contrast
Comparison: None.

________________________________________________________________________________________________ 
MAMMOGRAPHY SCREENING BILATERAL 3NYA JUMPER, 09/14/2021 [DATE]: 
CLINICAL INDICATION: Screening.
TECHNIQUE: Digital bilateral mammograms and 3-D Tomosynthesis were obtained. 
These were interpreted both primarily and with the aid of computer-aided 
detection system.  
BREAST DENSITY: (Level B) There are scattered areas of fibroglandular density.

[L CC]
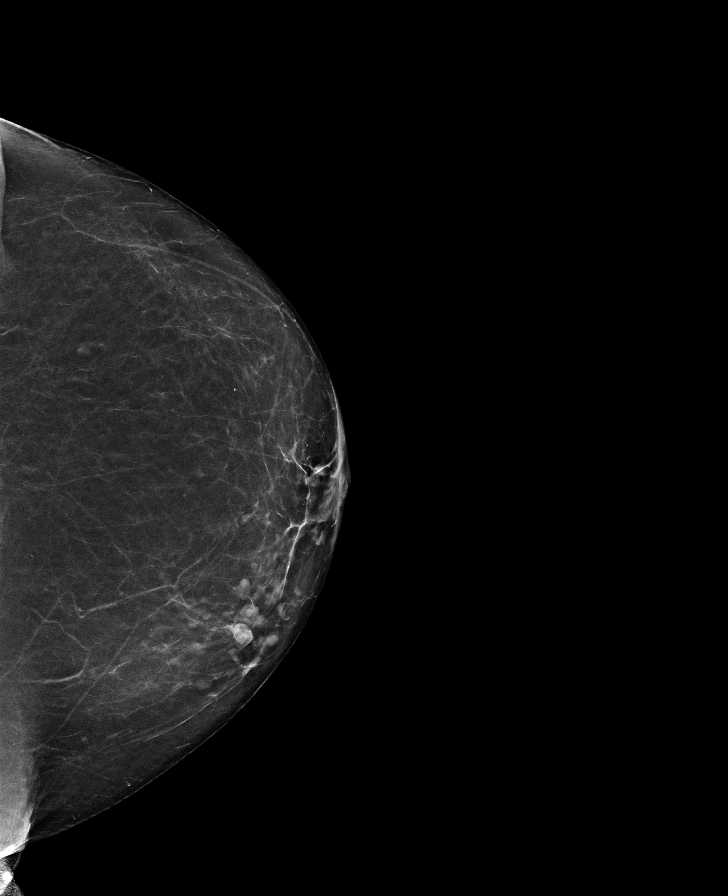

[L MLO]
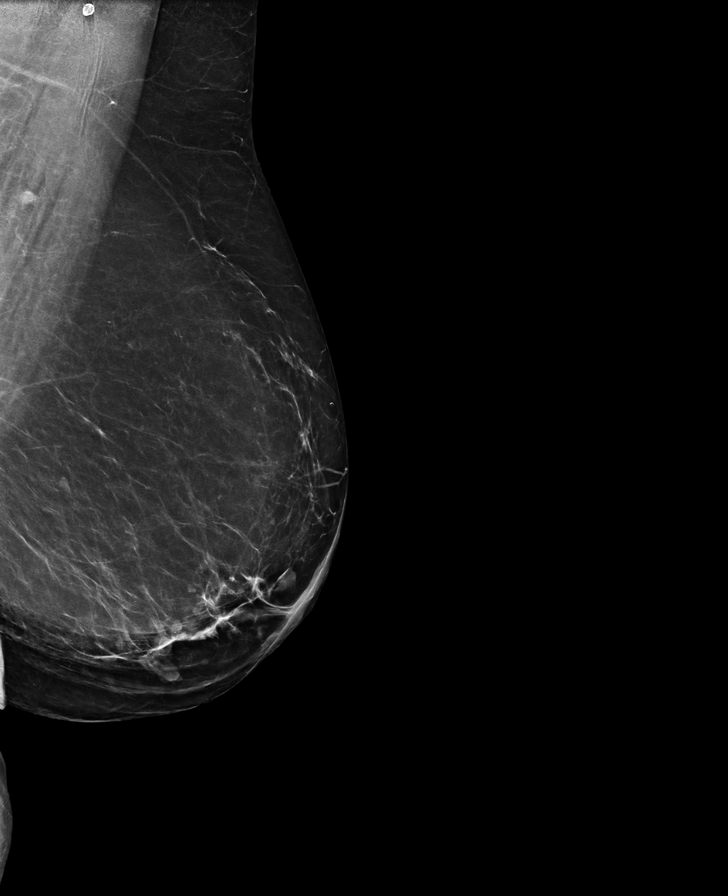

[R CC]
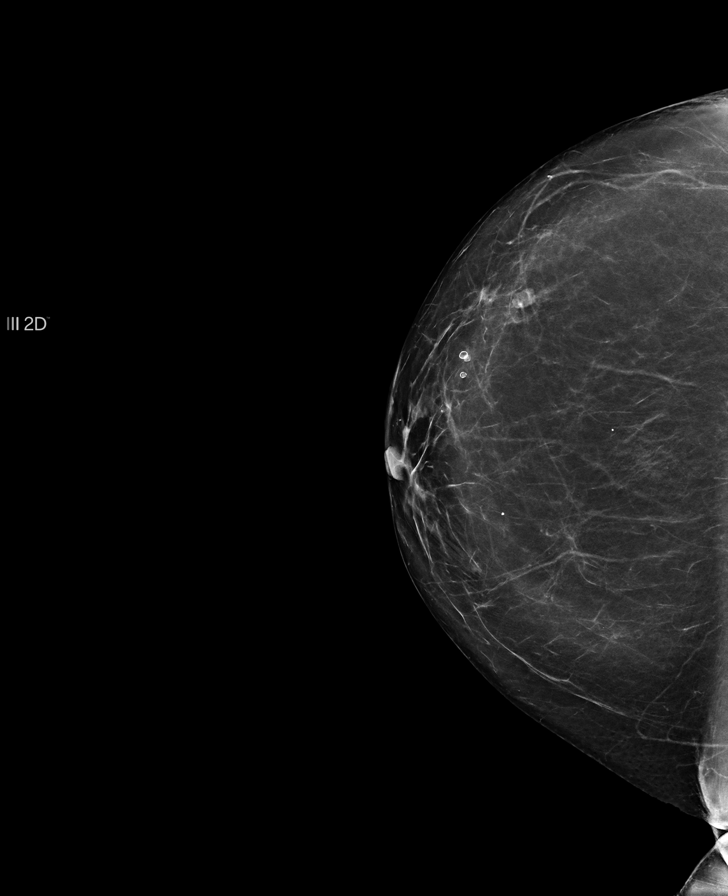

[R MLO]
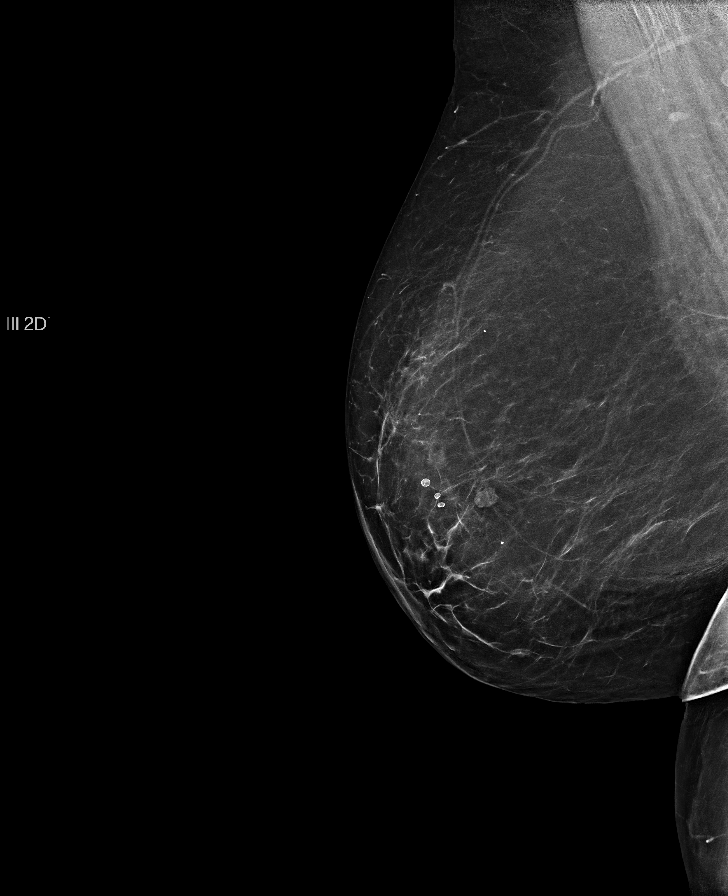

[R MLO tomo · tomo slice 39/77.0]
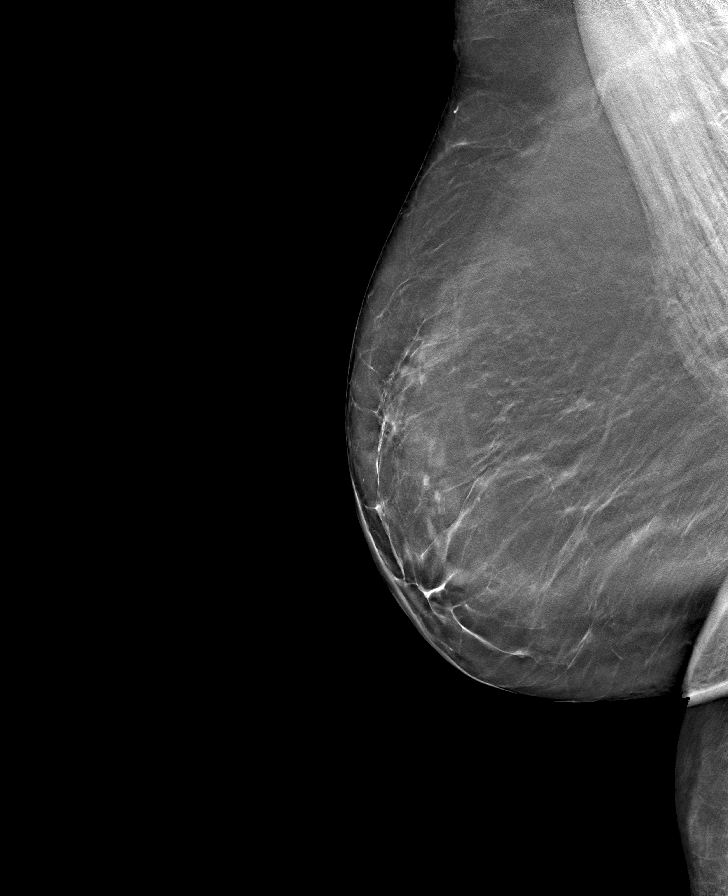

[L CC tomo · tomo slice 31/61.0]
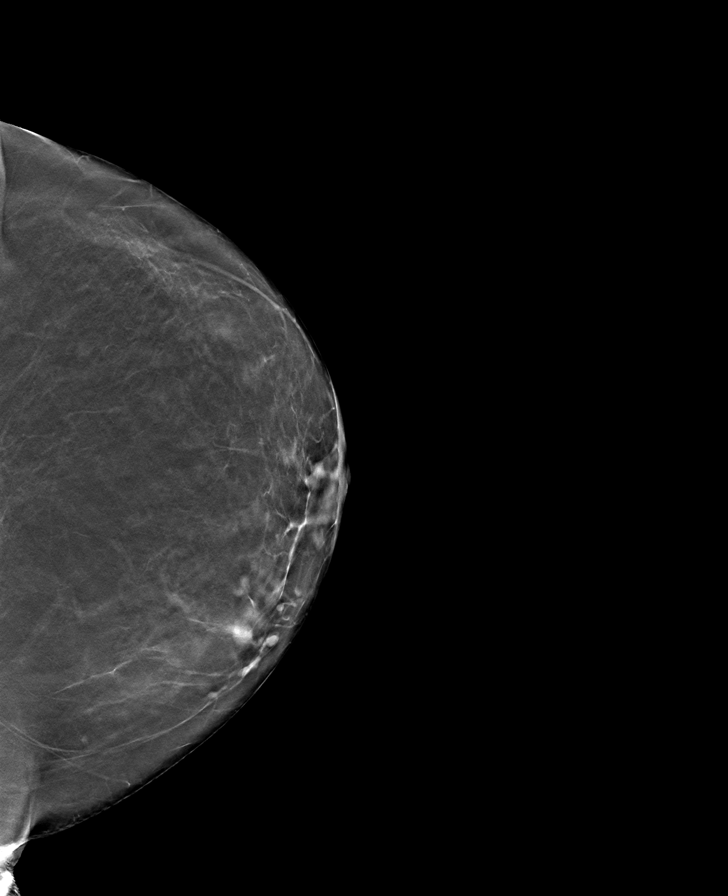

[L MLO tomo · tomo slice 37/72.0]
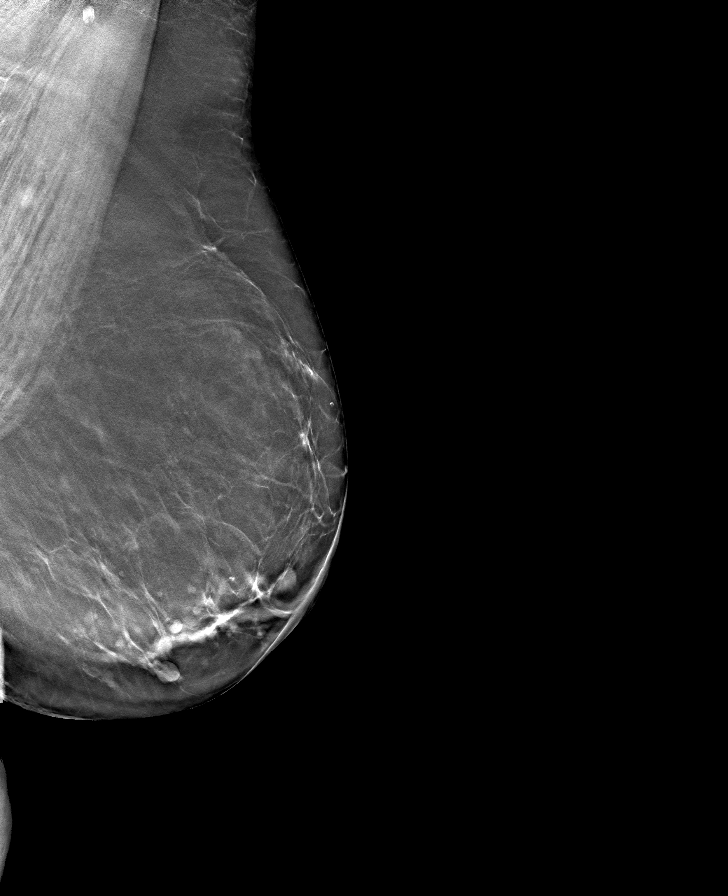

[R CC tomo · tomo slice 35/69.0]
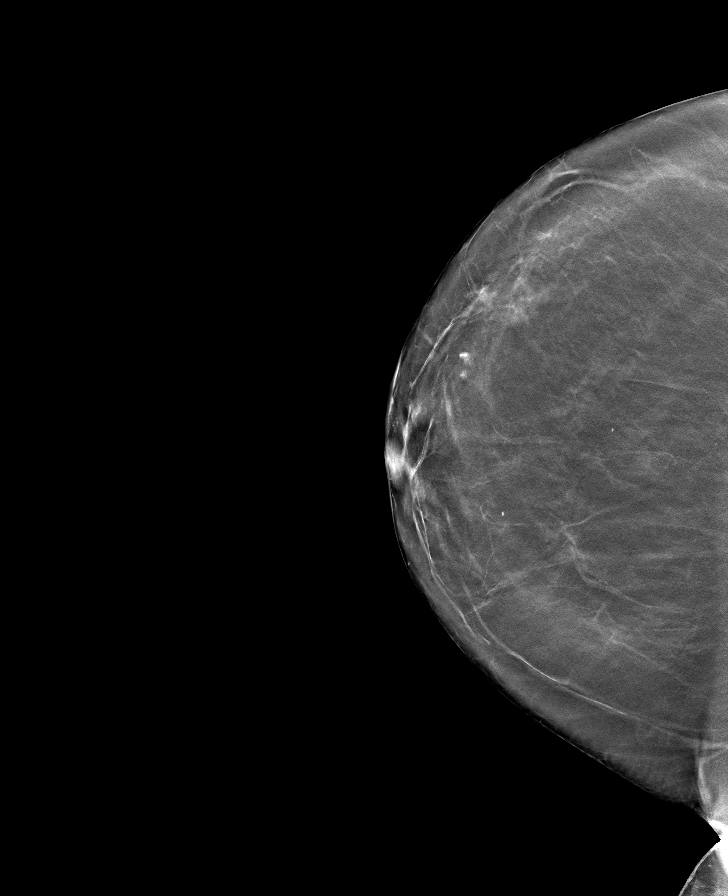

[8 of 24 positions shown; findings below may reference images not displayed]

FINDINGS: Benign-appearing calcification. Cluster isodense nodules medial 
retroareolar region of the left breast approximately 5 cm from the nipple with 
the largest measuring approximately 7 mm and within the right breast 
approximately the [DATE] position 6 cm from the nipple measuring 6 mm. No 
associated calcification or architectural distortion. No suspicious 
calcification.
IMPRESSION: Isodense nodules within both breasts with no associated calcification or 
architectural distortion. No prior mammograms are available for comparison. 
Recommend diagnostic mammogram and sonogram of both breasts. 
(BI-RADS 0) Incomplete. Further evaluation will be performed as discussed above 
and the results will be reported separately.

## 2021-11-10 IMAGING — MG MAMMOGRAPHY DIAGNOSTIC BILATERAL 3[PERSON_NAME]
8 series · 9 of 24 positions shown · non-contrast
Comparison: Comparison was made to prior examinations.

________________________________________________________________________________________________ 
MAMMOGRAPHY DIAGNOSTIC BILATERAL 3MONICA MABEL MONLA, 11/10/2021 [DATE]: 
CLINICAL INDICATION: Densities noted on screening mammogram.
TECHNIQUE: Digital bilateral mammograms and 3-D Tomosynthesis were obtained. 
These were interpreted both primarily and with the aid of computer-aided 
detection system.  
BREAST DENSITY: (Level B) There are scattered areas of fibroglandular density.

[R CC]
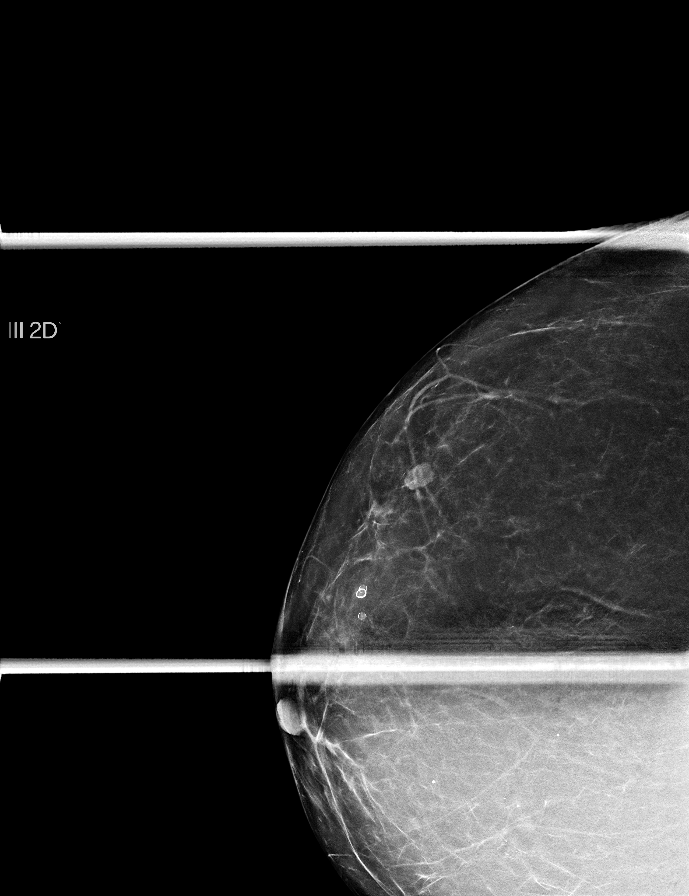

[L CC]
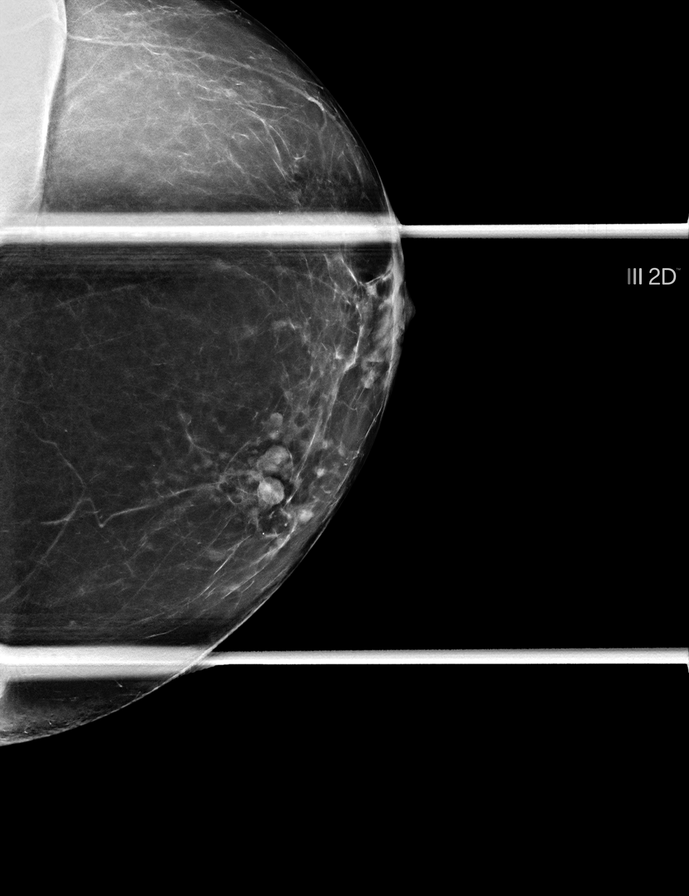

[L MLO]
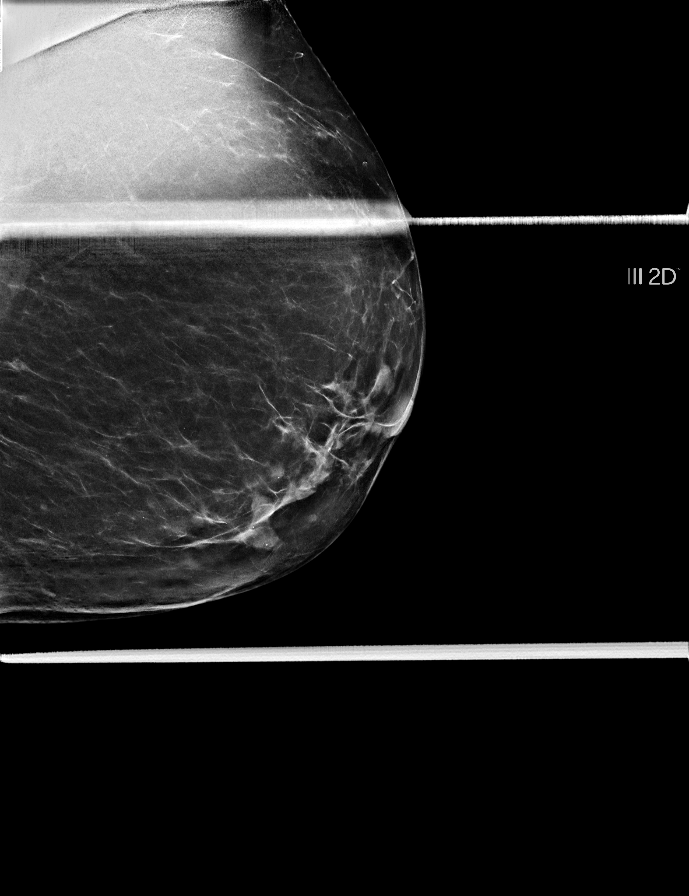

[R MLO]
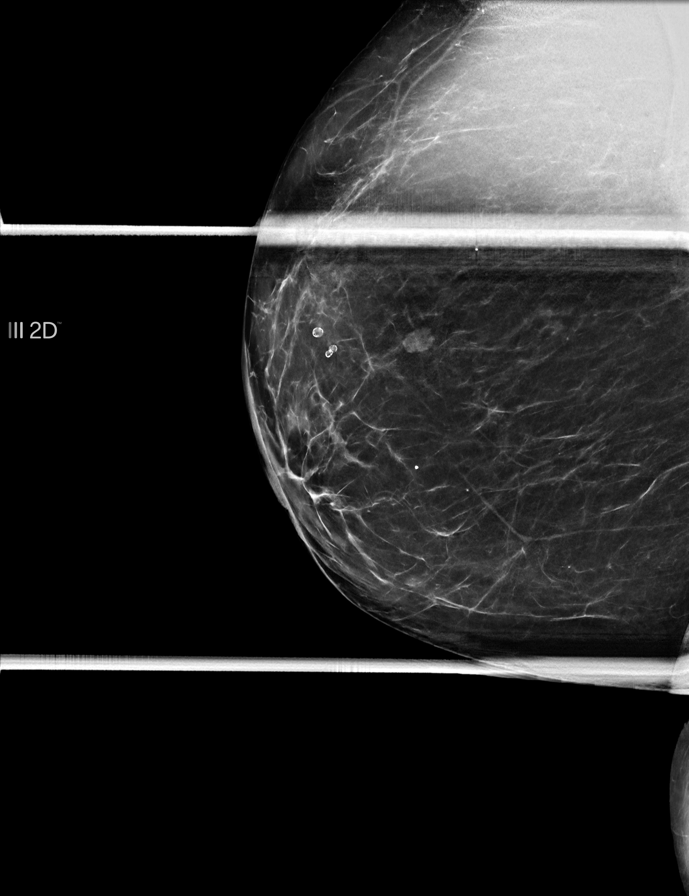

[L MLO tomo · 2 of 67 frames shown]
[frame 22/67]
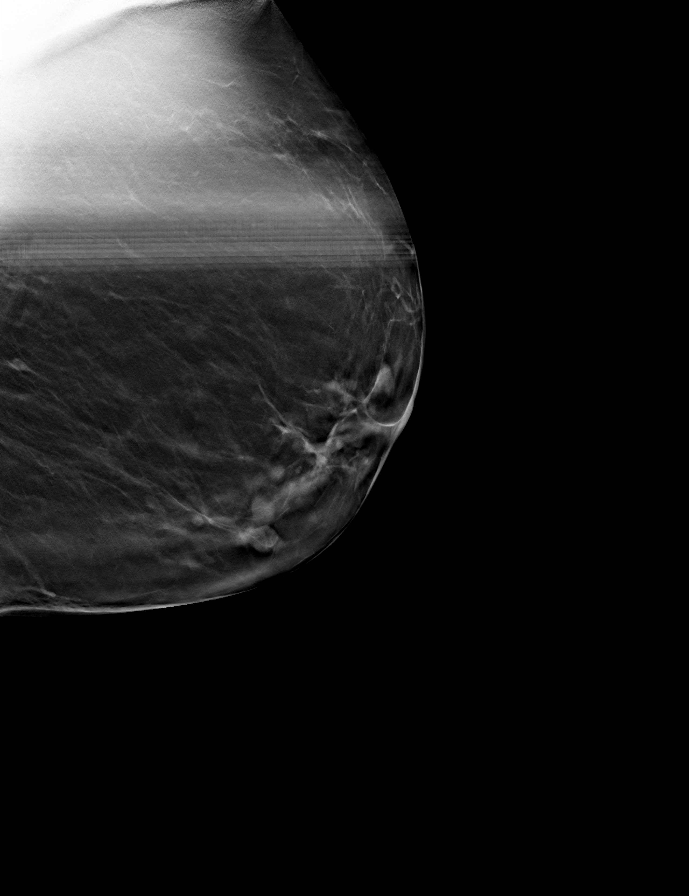
[frame 34/67]
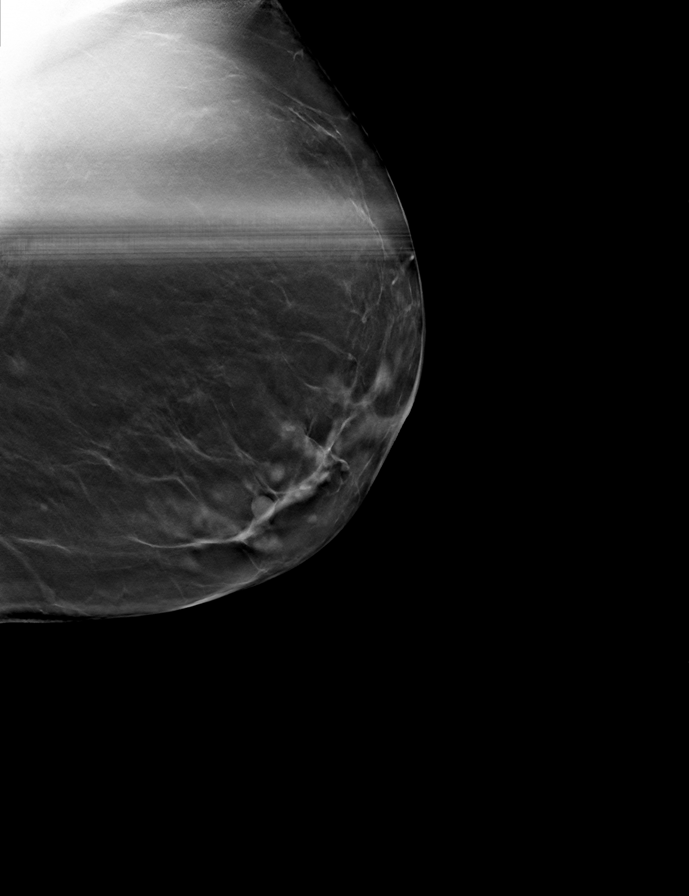

[R MLO tomo · tomo slice 35/70.0]
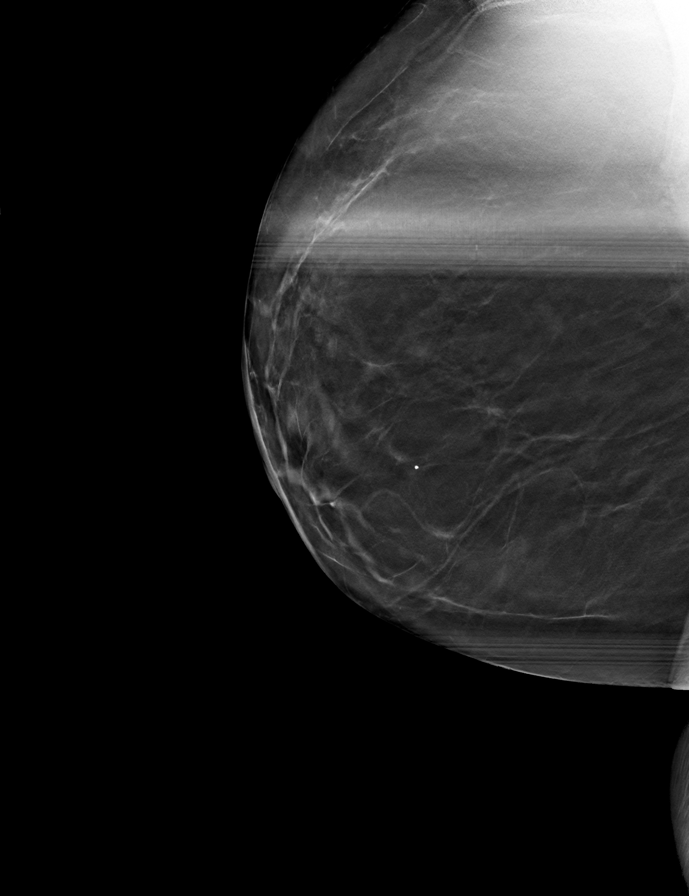

[R CC tomo · tomo slice 32/63.0]
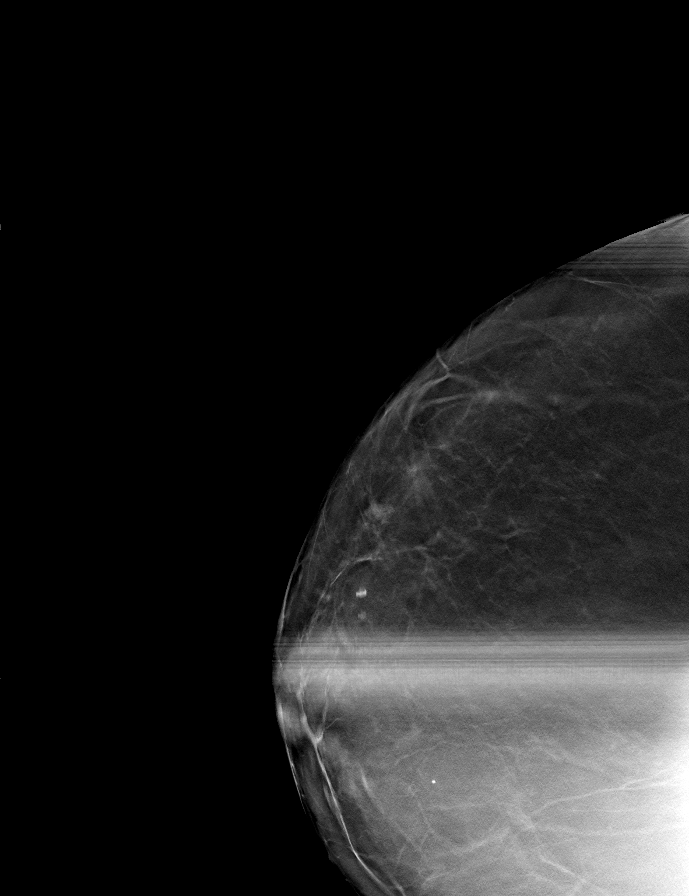

[L CC tomo · tomo slice 31/62.0]
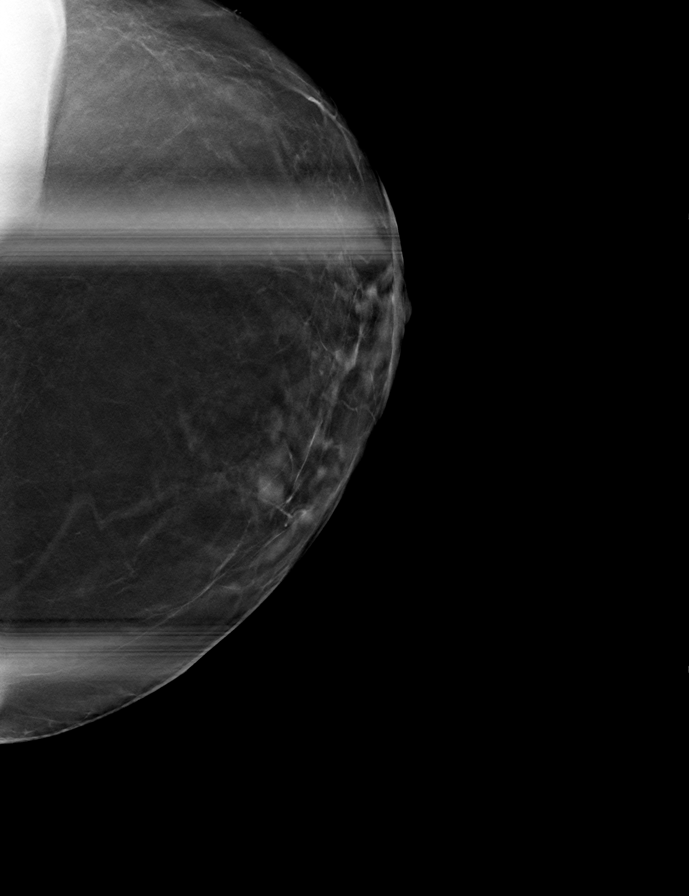

[9 of 24 positions shown; findings below may reference images not displayed]

FINDINGS: Isodense nodules left breast are again noted. Largest measuring 
approximately 7 mm. Within the right breast 6 mm isodense nodule is noted. There 
is no calcification or architectural distortion associated with the nodular 
densities.
IMPRESSION: Benign-appearing nodular densities within both breasts sonogram of both breasts 
demonstrated a cluster of cysts within the left breast. No correlate is noted 
within the right breast given the nature noted on the tomographic images this 
may represent a mole as the patient is noted to have multiple moles in both 
breasts. No dominant mass or architectural distortion is 
(BI-RADS 2) Benign findings. Routine mammographic follow-up is recommended.

## 2022-01-04 IMAGING — DX RIBS LEFT 2 VIEW
1 series · 3 of 3 positions shown · non-contrast
Comparison: None.

________________________________________________________________________________________________ 
RIBS LEFT 2 VIEW, 01/04/2022 [DATE]: 
CLINICAL INDICATION: Left rib pain.

[Series 1: AP · U · 0.14mm/px · 3 of 3 slices shown]
[im 1/3]
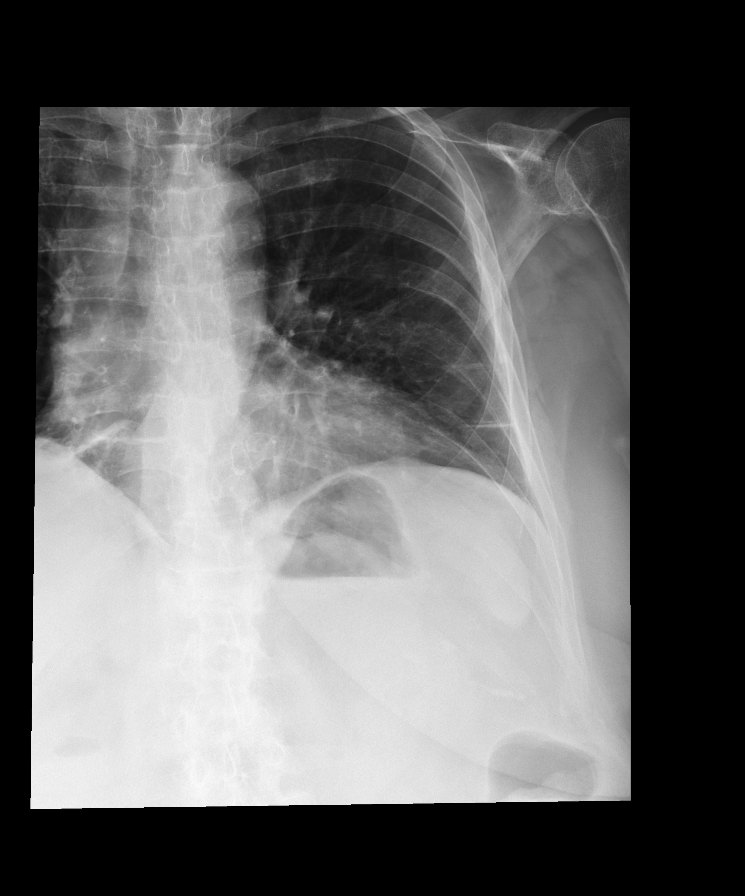
[im 2/3]
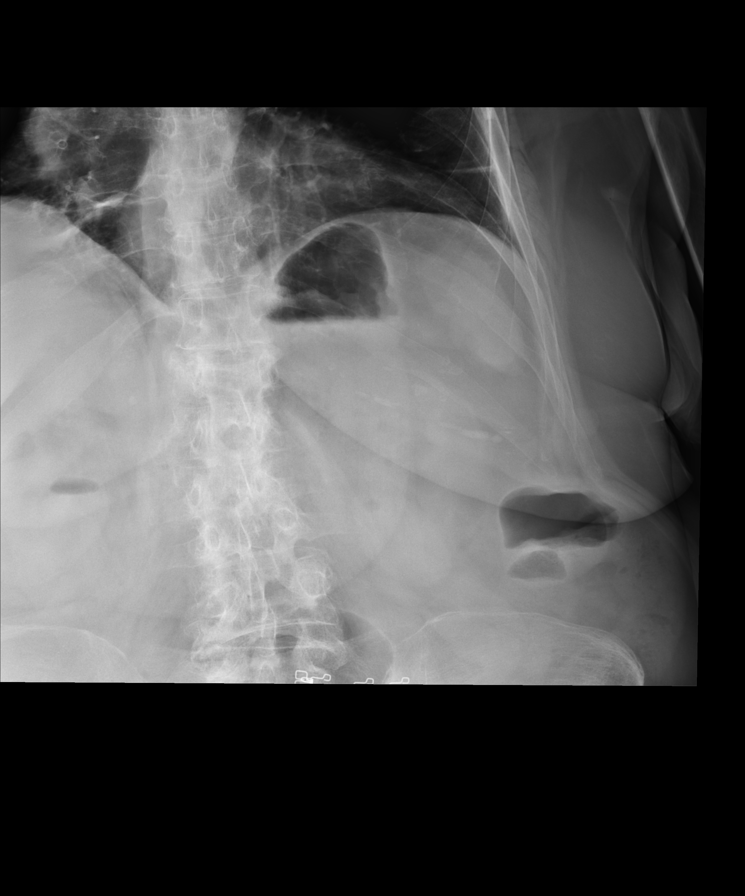
[im 3/3]
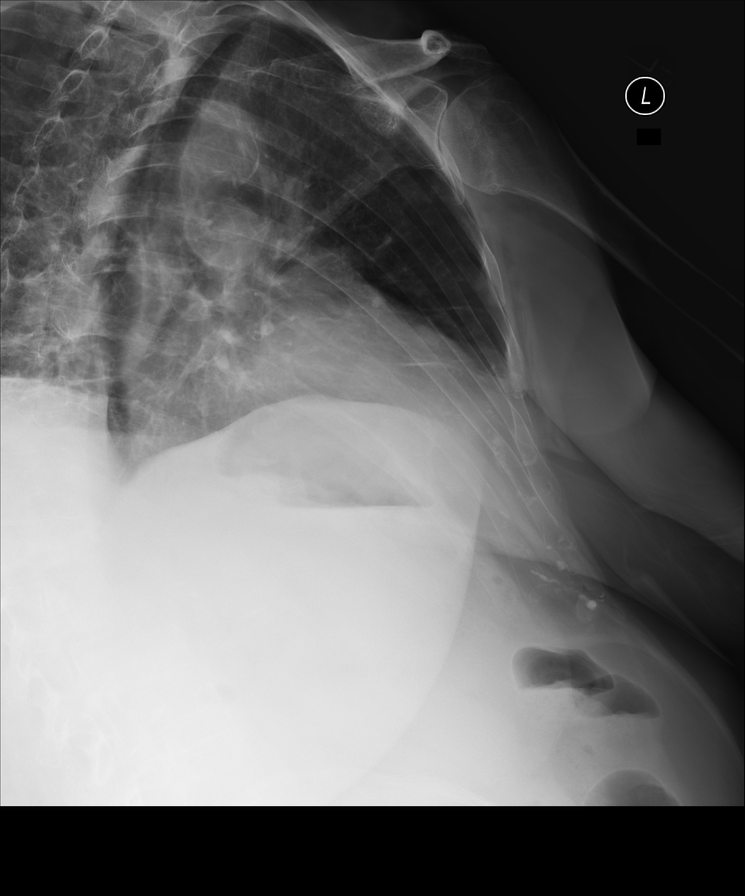

[3 of 3 positions shown; findings below may reference images not displayed]

FINDINGS: No displaced left rib fractures or pneumothorax. Minimal left 
basilar/mid lung atelectasis. Cardiomediastinal silhouette is within normal 
limits. Atherosclerosis. No pleural effusions.
IMPRESSION: No displaced left rib fractures or pneumothorax.

## 2022-01-12 IMAGING — CT CT CHEST WITHOUT CONTRAST
2 of 3 series · 15 of 36 positions shown, 18 images · non-contrast
Comparison: 03/02/2021.

________________________________________________________________________________________________ 
CT CHEST WITHOUT CONTRAST, 01/12/2022 [DATE]: 
CLINICAL INDICATION: Leukemia. 
A search for DICOM formatted images was conducted for prior CT imaging studies 
completed at a non-affiliated media free facility.
TECHNIQUE: The chest was scanned from base of neck through the lung bases 
without contrast on a high resolution low dose CT scanner. Routine MPR and MIP 
3D renderings were reconstructed on an independent workstation with concurrent 
physician supervision.

[Series 2: axial · axial · 0.74mm/px · z∈[-412,-162]mm · 12 of 139 slices shown, 15 images]
[im 7/139  mediastinal]
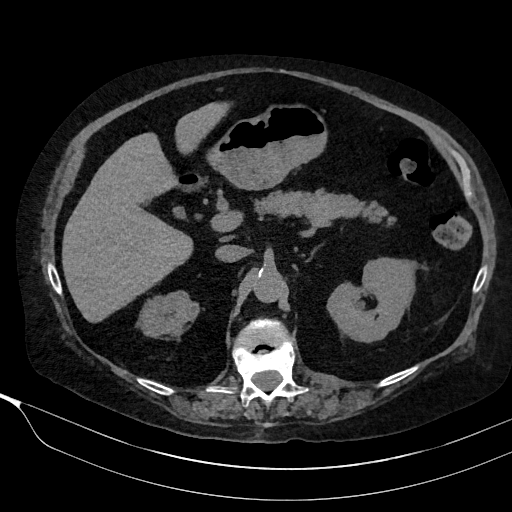
[im 7/139  lung]
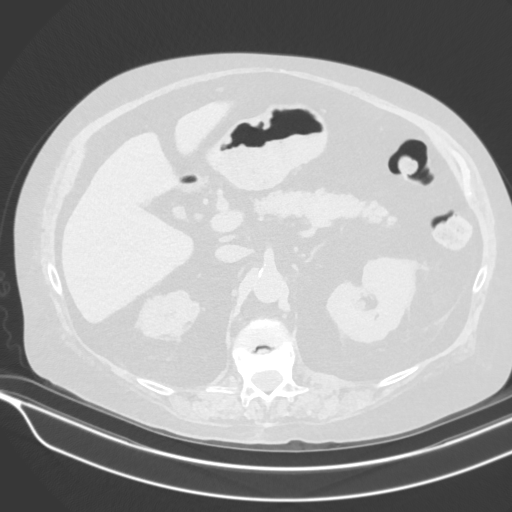
[im 20/139  lung]
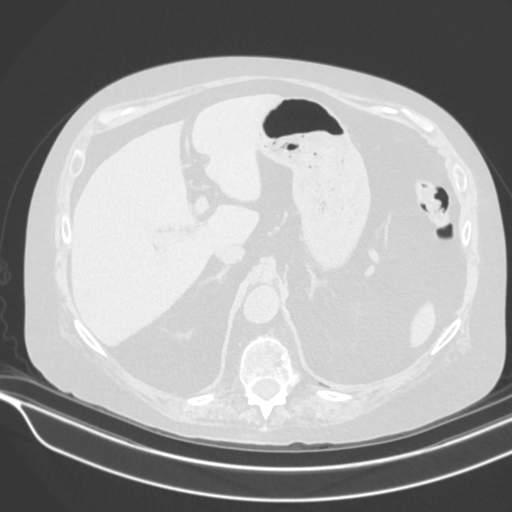
[im 33/139  lung]
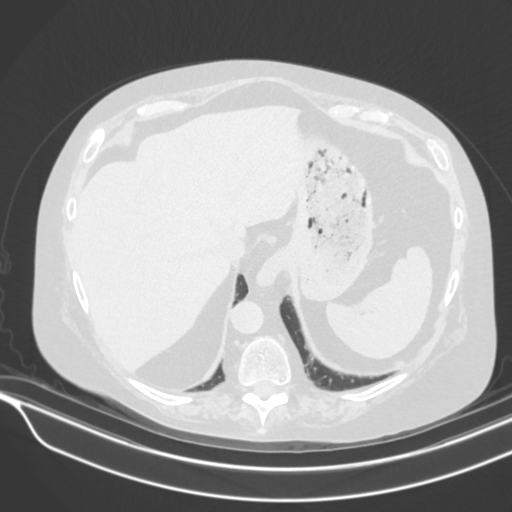
[im 47/139  lung]
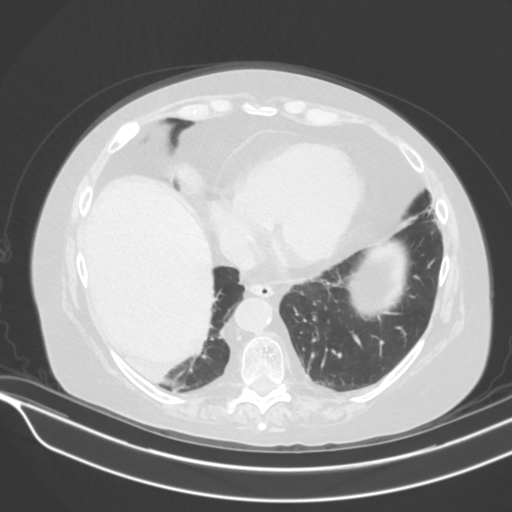
[im 53/139  mediastinal]
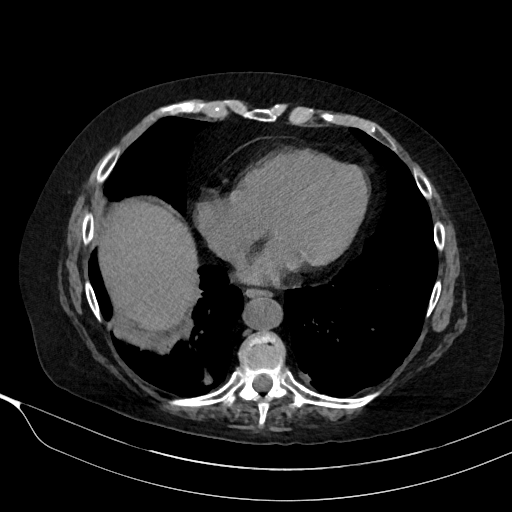
[im 53/139  lung]
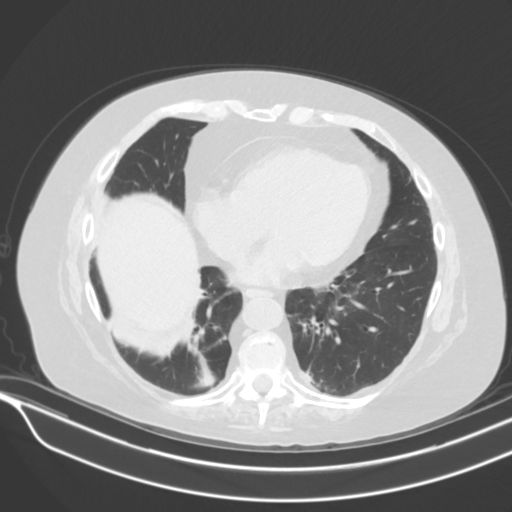
[im 66/139  lung]
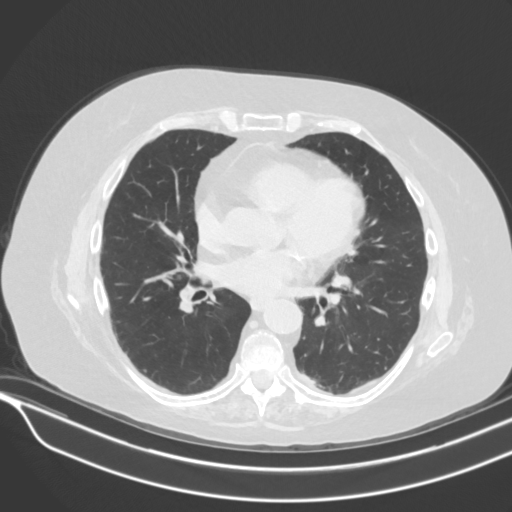
[im 73/139  lung]
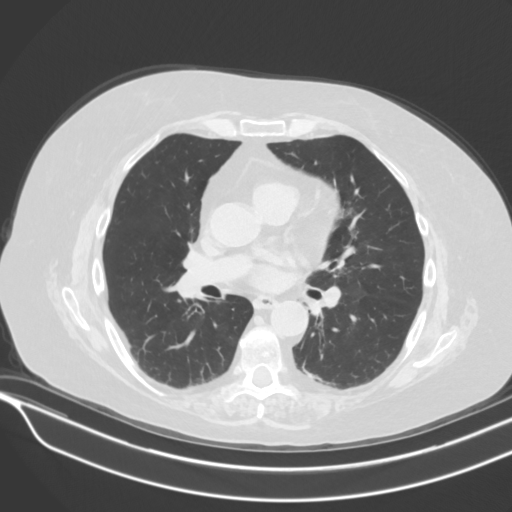
[im 86/139  lung]
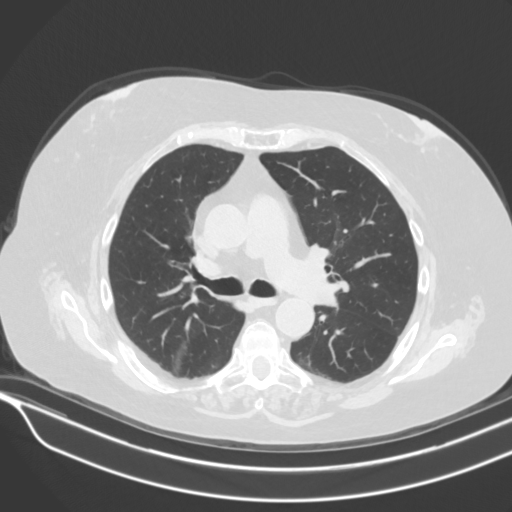
[im 93/139  mediastinal]
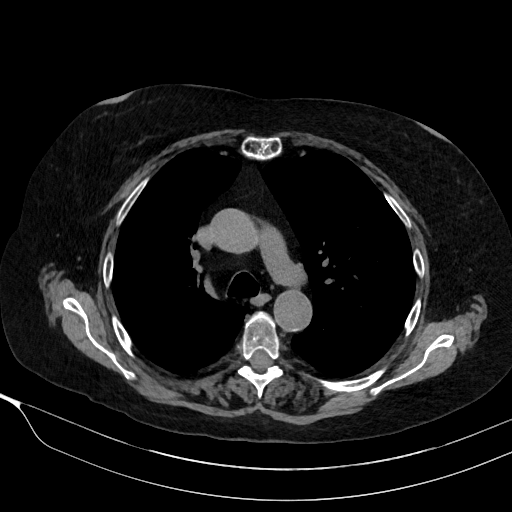
[im 93/139  lung]
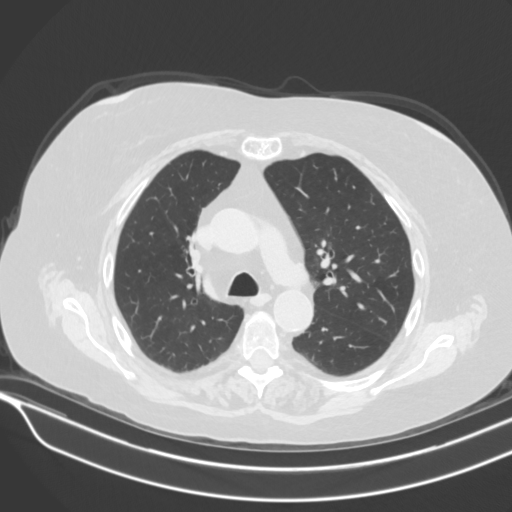
[im 106/139  lung]
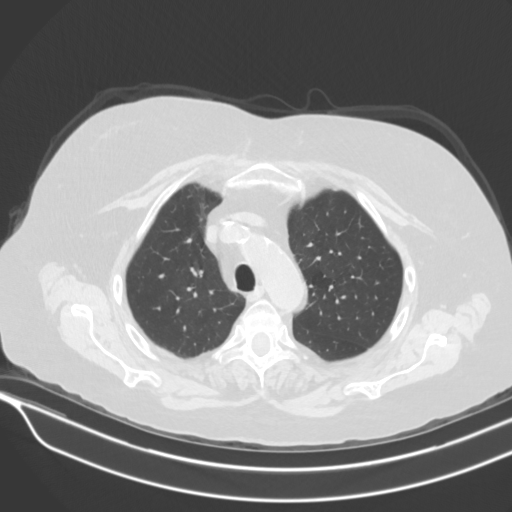
[im 119/139  lung]
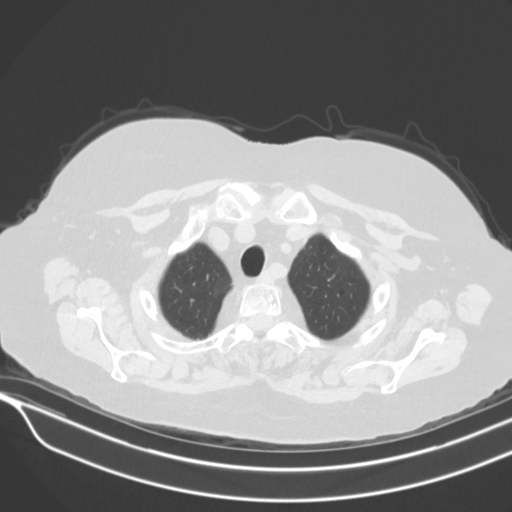
[im 132/139  lung]
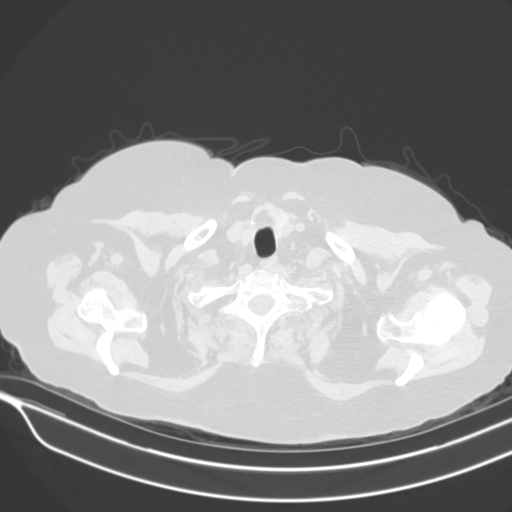

[Series 4: cor · coronal · 0.59mm/px · 3 of 143 slices shown]
[im 29/143  lung]
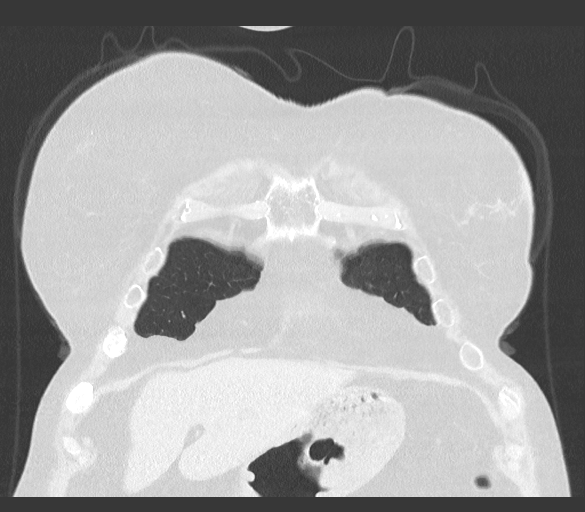
[im 57/143  lung]
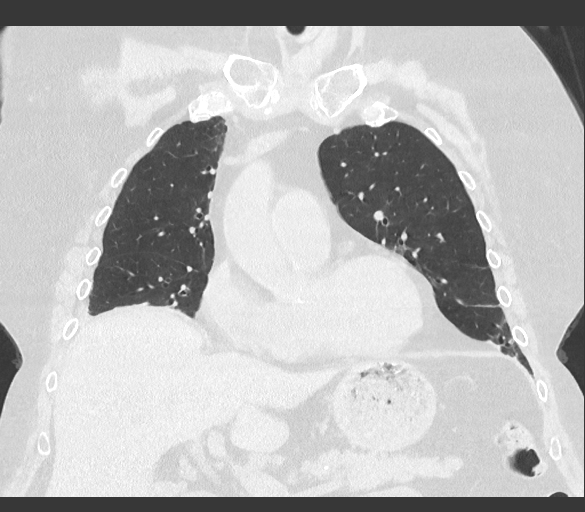
[im 86/143  lung]
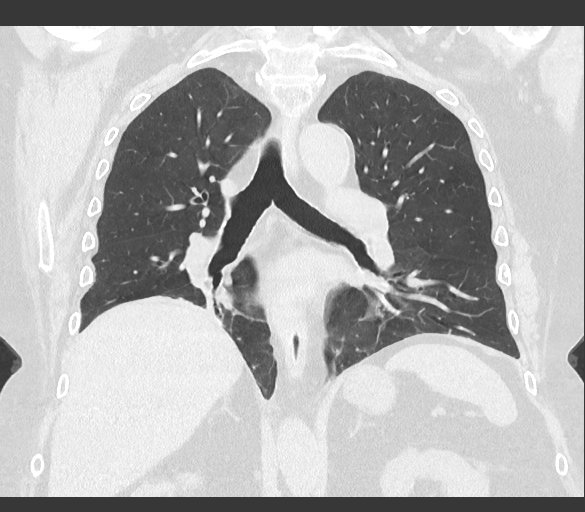

[15 of 36 positions shown; findings below may reference images not displayed]

FINDINGS: LUNGS AND PLEURA:  Pleural thickening persists slightly greater on the right the 
left. Peribronchial thickening with scattered areas of atelectasis. No pleural 
effusion. Stable nodule left lung base measuring approximately 11 x 7 mm. 
Scattered noncalcified micronodules. 
MEDIASTINUM:  No adenopathy. Normal heart size. No pericardial effusion. 
CHEST WALL/AXILLA: No mass or adenopathy.  
UPPER ABDOMEN: Calcified hepatic granuloma. Small hiatal hernia. Right kidney 
slightly more diminutive than the left with both kidneys demonstrate lobulated 
configuration 
MUSCULOSKELETAL: Sclerosis anterior right fifth and sixth ribs appears represent 
a healing fracture. Healed anterior sixth and seventh left rib fractures. 
Vertebral plana fracture L1. Degenerative change. Vacuum discs. Lucent areas 
within the sternum but no evidence of cortical disruption.
IMPRESSION: Stable 11 mm left lower lobe nodule which did not demonstrate uptake on the PET 
scan 03/22/2021. Stable scattered noncalcified micronodules. 
Pleural thickening but no effusion. 
Peribronchial thickening and scattered atelectasis but no acute consolidation. 
Vertebral plana fracture L1 which is new compared to previous exam 03/02/2021. 
Bilateral rib fractures which are new compared to previous exam 03/02/2021.. 
RADIATION DOSE REDUCTION: All CT scans are performed using radiation dose 
reduction techniques, when applicable.  Technical factors are evaluated and 
adjusted to ensure appropriate moderation of exposure.  Automated dose 
management technology is applied to adjust the radiation doses to minimize 
exposure while achieving diagnostic quality images.

## 2022-03-29 IMAGING — MR MRI RIGHT ANKLE WITHOUT CONTRAST
4 of 5 series · 26 of 40 positions shown · IV contrast (gadolinium)
Comparison: None.

________________________________________________________________________________________________ 
MRI RIGHT ANKLE WITHOUT CONTRAST, 03/29/2022 [DATE]: 
CLINICAL INDICATION: Pain in unspecified ankle and joints of unspecified foot.. 
Lateral malleolus has a sore that will go away.
TECHNIQUE: Multiplanar, multiecho position MR images of the right ankle were 
performed without intravenous gadolinium enhancement. Patient was scanned on a 
1.5T magnet.

[Series 201: survey_right · axial · 10.0mm · 1.17mm/px · z∈[-20,+149]mm · 2 of 9 slices shown]
[im 1/9]
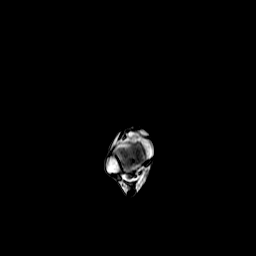
[im 9/9]
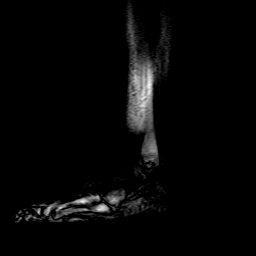

[Series 301: (person_name)_(person_name)_(person_name) · axial · 3.0mm · 0.34mm/px · z∈[-106,+33]mm · 9 of 42 slices shown]
[im 1/42]
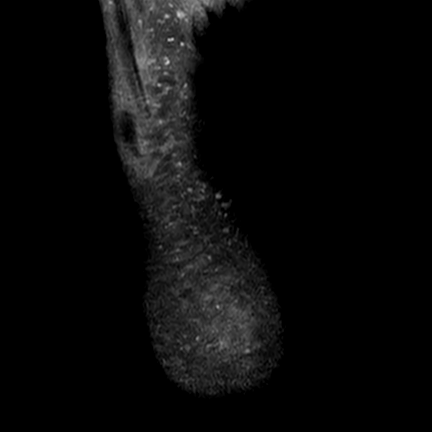
[im 8/42]
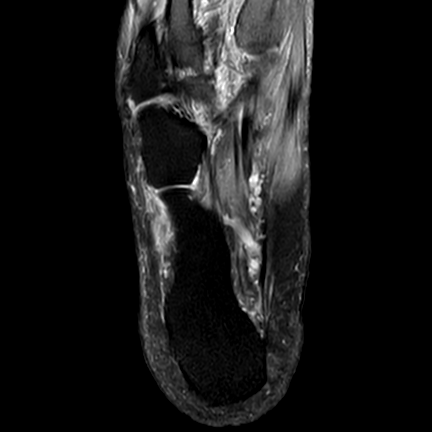
[im 12/42]
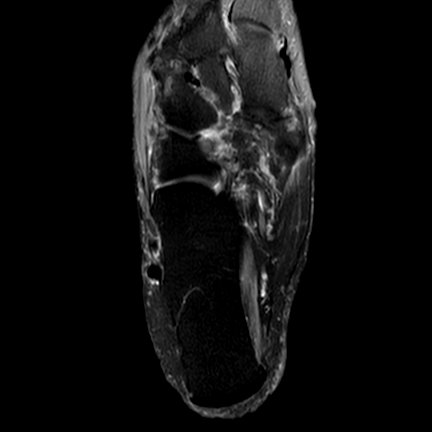
[im 19/42]
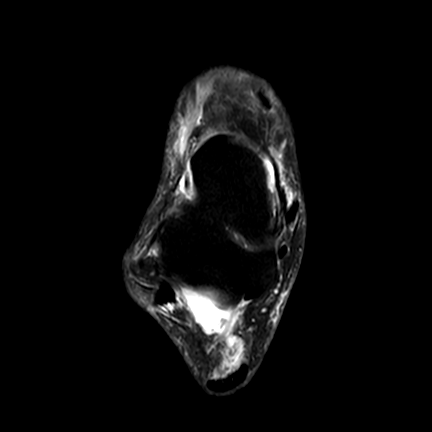
[im 23/42]
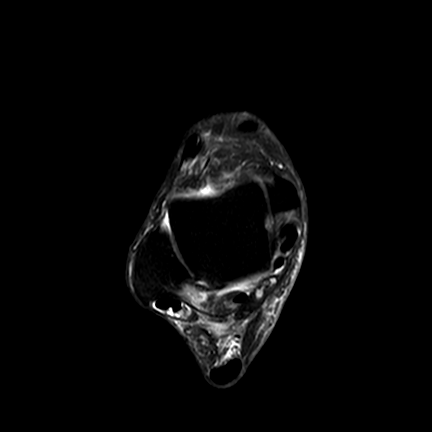
[im 30/42]
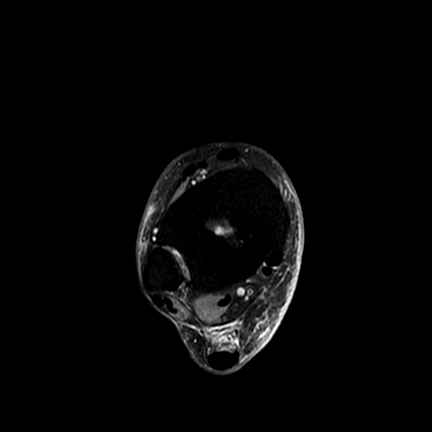
[im 34/42]
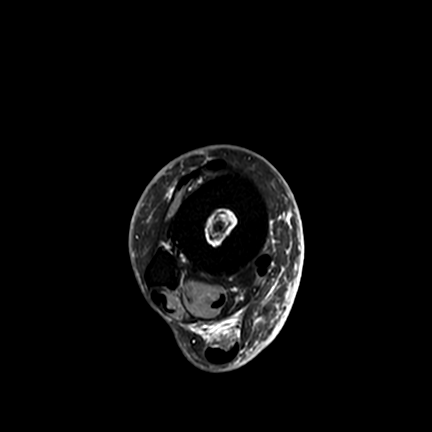
[im 38/42]
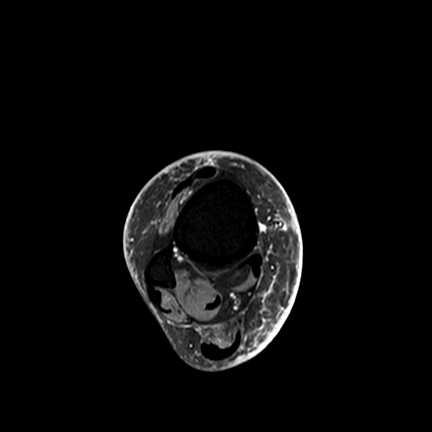
[im 42/42]
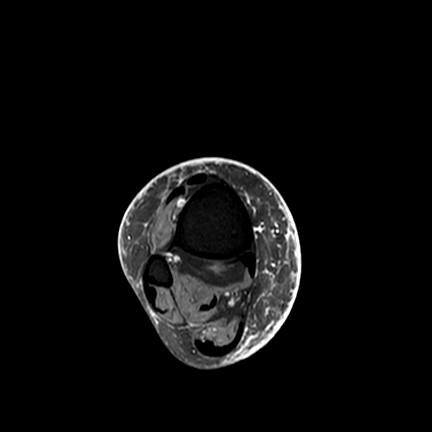

[Series 401: t1_sag · sagittal · 2.5mm · 0.31mm/px · 8 of 27 slices shown]
[im 1/27]
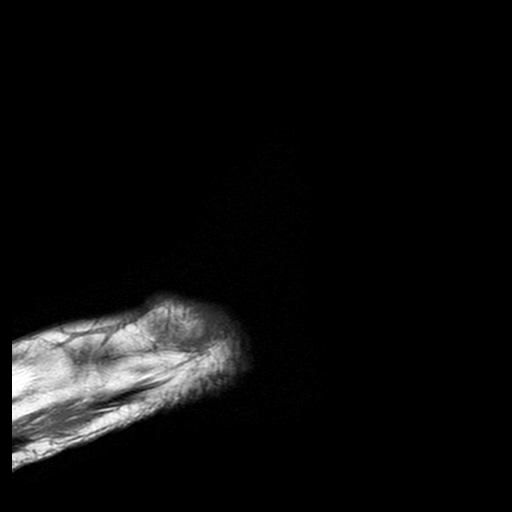
[im 4/27]
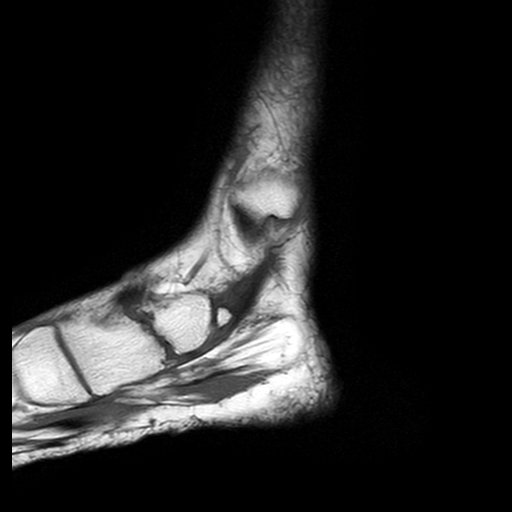
[im 8/27]
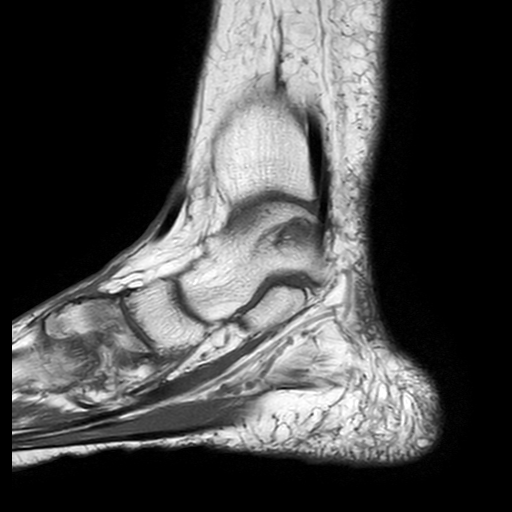
[im 12/27]
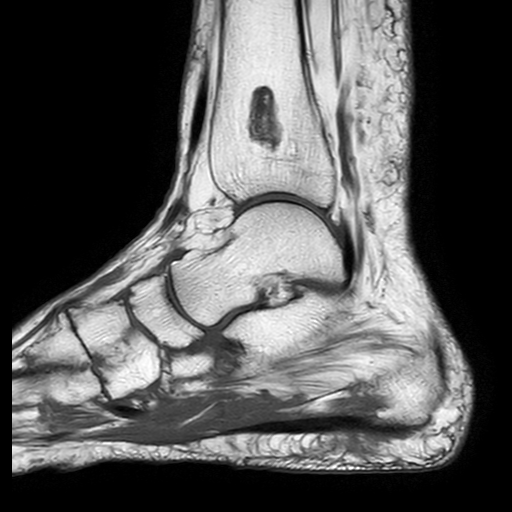
[im 15/27]
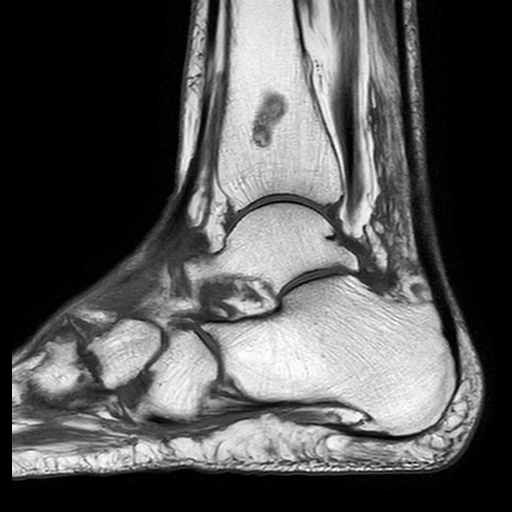
[im 19/27]
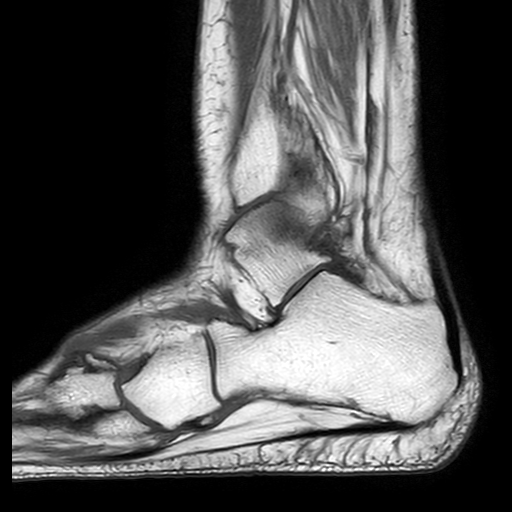
[im 23/27]
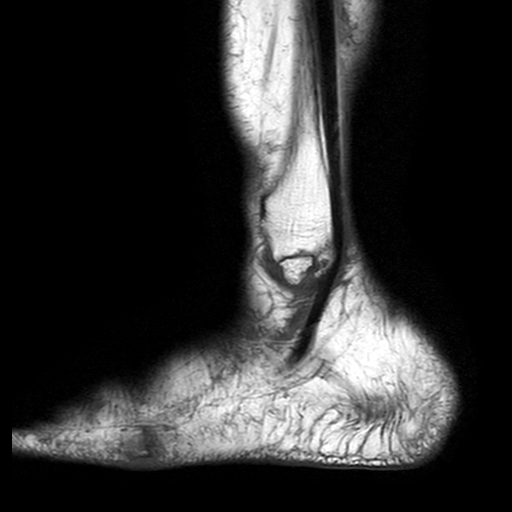
[im 27/27]
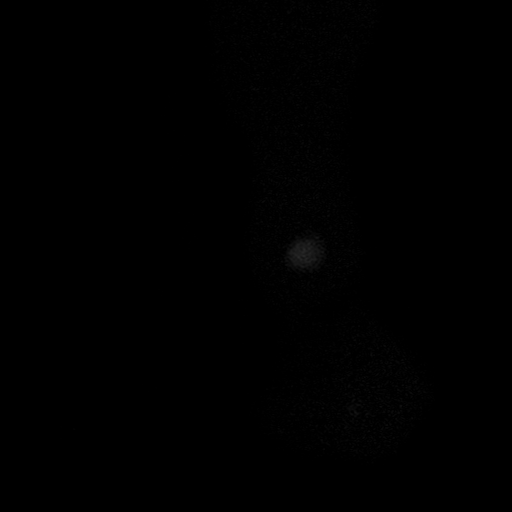

[Series 501: pd_fs_sag · sagittal · 2.5mm · 0.45mm/px · 7 of 27 slices shown]
[im 1/27]
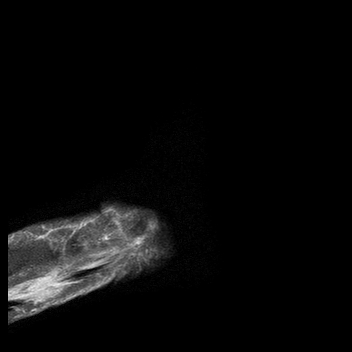
[im 4/27]
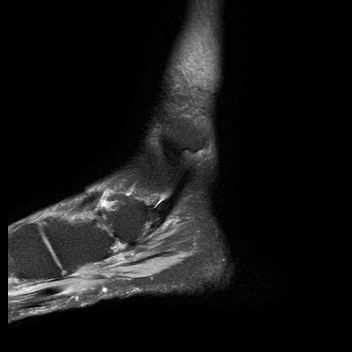
[im 8/27]
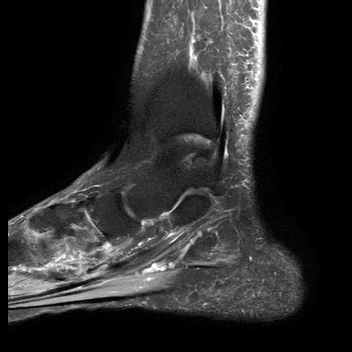
[im 12/27]
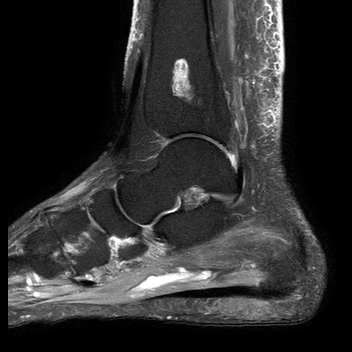
[im 15/27]
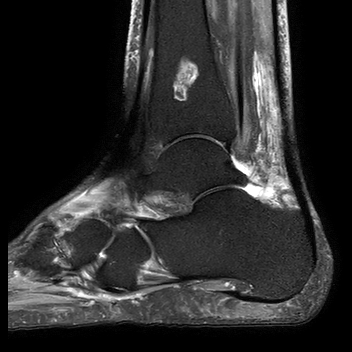
[im 19/27]
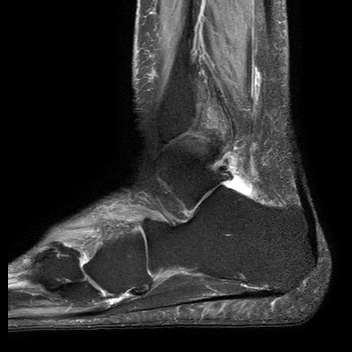
[im 23/27]
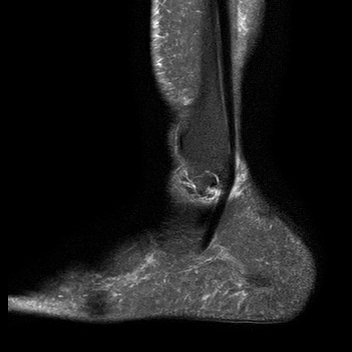

[26 of 40 positions shown; findings below may reference images not displayed]

FINDINGS: TENDONS: The flexor, extensor and peroneal tendons are intact. No tendon tear, 
tenosynovitis or tendinopathy. No tendon subluxation. The Achilles tendon is 
preserved. 
LIGAMENTS:  
LATERAL LIGAMENTS: The anterior talofibular ligament is intact. The 
calcaneofibular ligament and posterior talofibular ligament are preserved. 
SYNDESMOTIC LIGAMENTS: The anterior and posterior tibiofibular and interosseous 
ligaments are preserved. 
DELTOID LIGAMENTOUS COMPLEX: The deep and superficial components of the deltoid 
ligamentous complex are intact. 
SINUS TARSI LIGAMENTS: The cervical and interosseous ligaments are preserved. 
The inferior extensor retinaculum appears intact.  
CASTON LIGAMENTOUS COMPLEX: Plantar calcaneonavicular ligament preserved. 
BONES AND JOINTS: Old ununited fracture of the distal tip of the fibula 
involving its distal 7 mm. Scattered articular cartilaginous loss subcortical 
cystic change of the tibial plafond. There is advanced articular cartilaginous 
loss of the midfoot with osseous remodeling, osteophytic spurring and 
subcortical cystic change. There is an area of serpiginous signal abnormality 
within the distal tibial metaphysis with appearance compatible with 
osteonecrosis. This involves and SI distance of approximately 2 cm. Prominent 
plantar calcaneal spur. 
ADDITIONAL FINDINGS: There is thickening of the plantar fascial the calcaneal 
attachment with mild surrounding soft tissue edema. Small posterior fusion at 
the talocalcaneal juncture. Mild scattered edema within the subcutaneous 
tissues. Musculature is symmetric.
IMPRESSION: Degenerative changes with greatest involvement of the midfoot. 
Osteonecrosis distal tibial metaphysis. 
Chronic plantar fasciitis.

## 2022-05-13 IMAGING — MR MRI CERVICAL SPINE WITHOUT CONTRAST
6 of 8 series · 20 of 48 positions shown · IV contrast (gadolinium)
Comparison: None.

________________________________________________________________________________________________ 
MRI CERVICAL SPINE WITHOUT CONTRAST, 05/13/2022 [DATE]: 
CLINICAL INDICATION: Chronic neck pain..
TECHNIQUE: Multiplanar, multiecho position MR images of the cervical spine were 
performed without intravenous gadolinium enhancement. Patient was scanned on a 
1.5T magnet. Exam degraded by patient motion.

[Series 101: survey* · axial · 10.0mm · 1.56mm/px · z∈[-30,+199]mm · 4 of 15 slices shown]
[im 1/15]
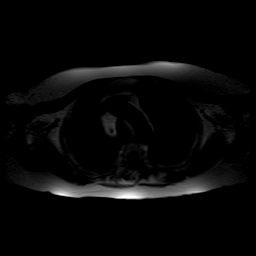
[im 5/15]
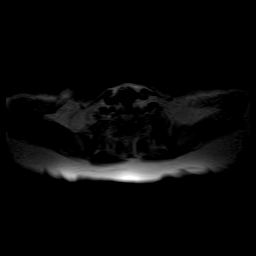
[im 10/15]
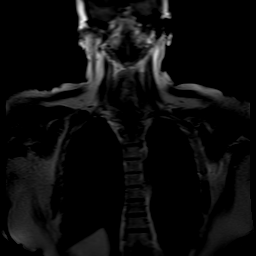
[im 15/15]
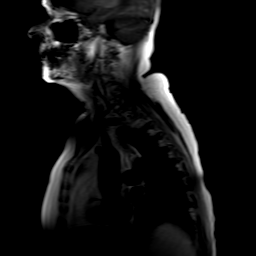

[Series 201: t2w_cor-surv · coronal · 5.0mm · 0.85mm/px · 3 of 7 slices shown]
[im 1/7]
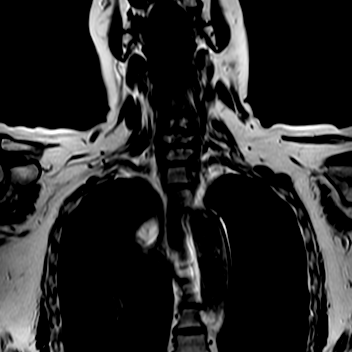
[im 4/7]
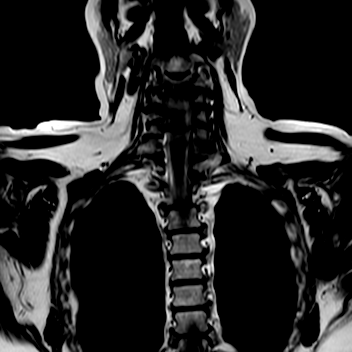
[im 7/7]
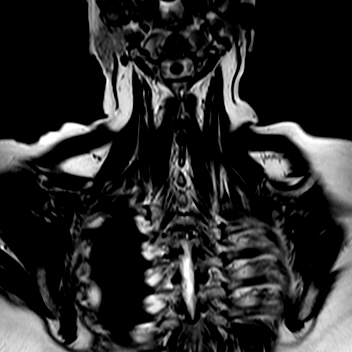

[Series 301: t1_sag · sagittal · 3.0mm · 0.32mm/px · 3 of 15 slices shown]
[im 1/15]
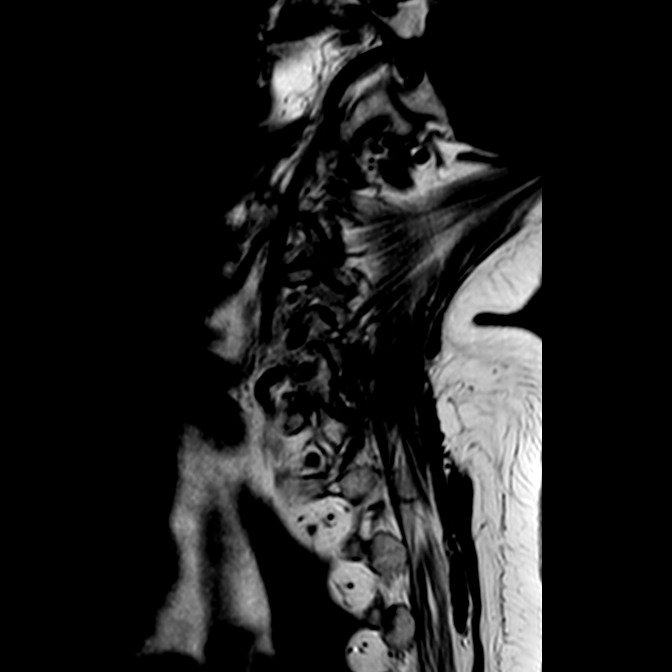
[im 8/15]
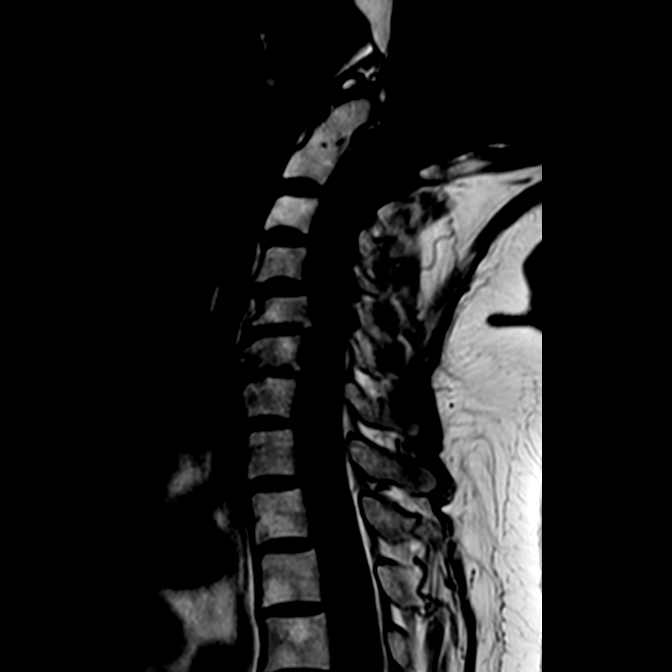
[im 15/15]
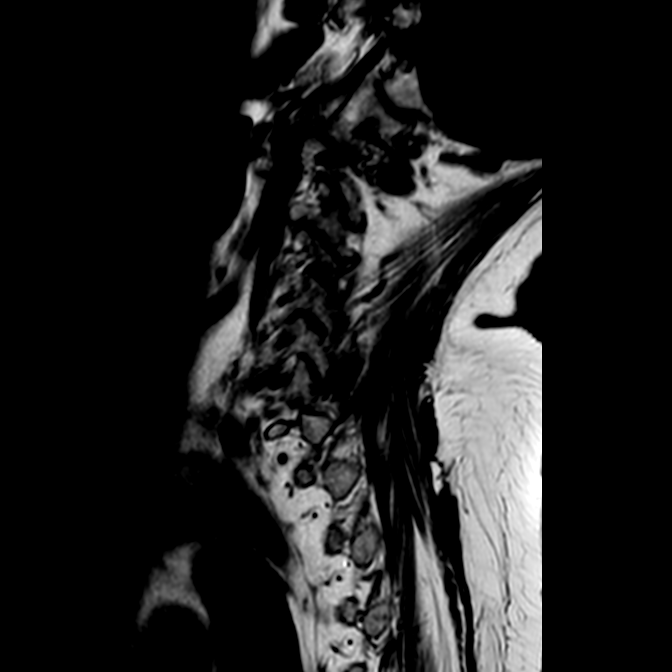

[Series 401: t2w_mv_xd_sag · sagittal · 3.0mm · 0.31mm/px · 3 of 15 slices shown]
[im 1/15]
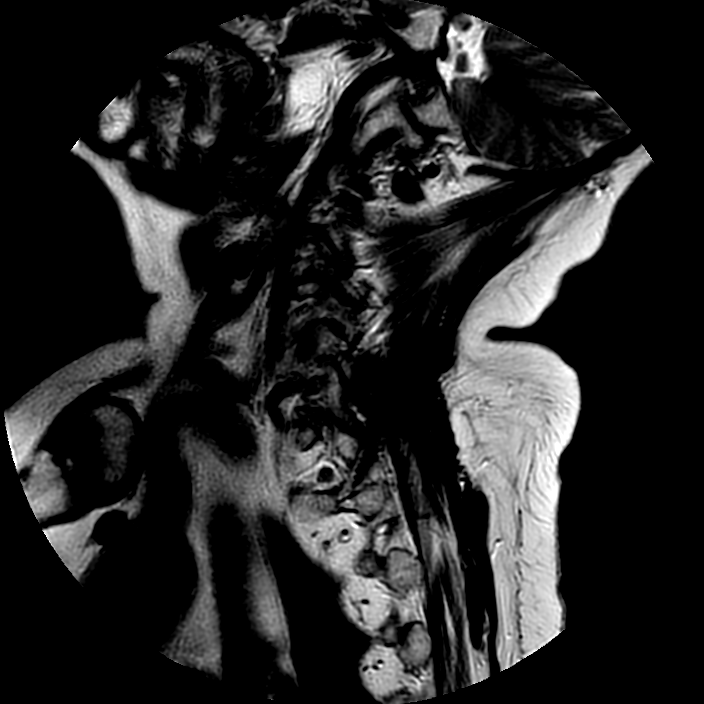
[im 8/15]
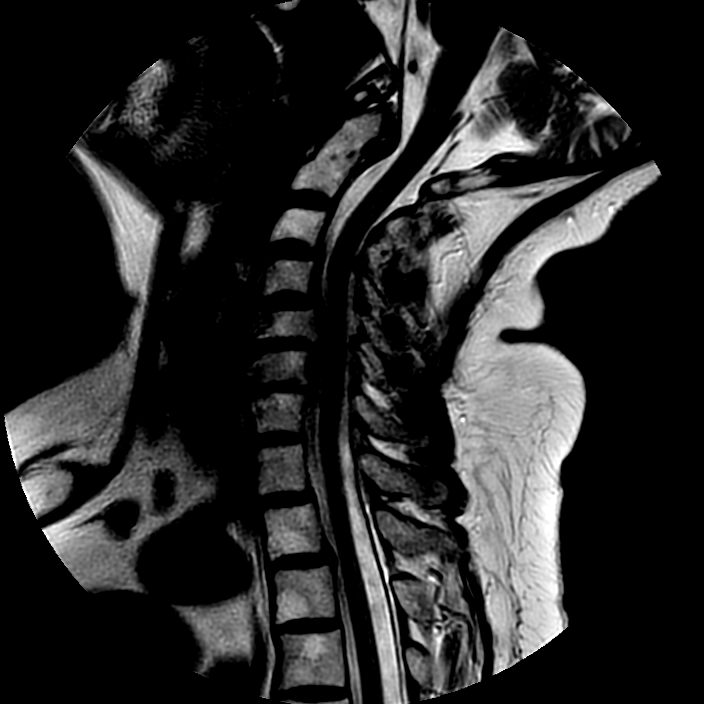
[im 15/15]
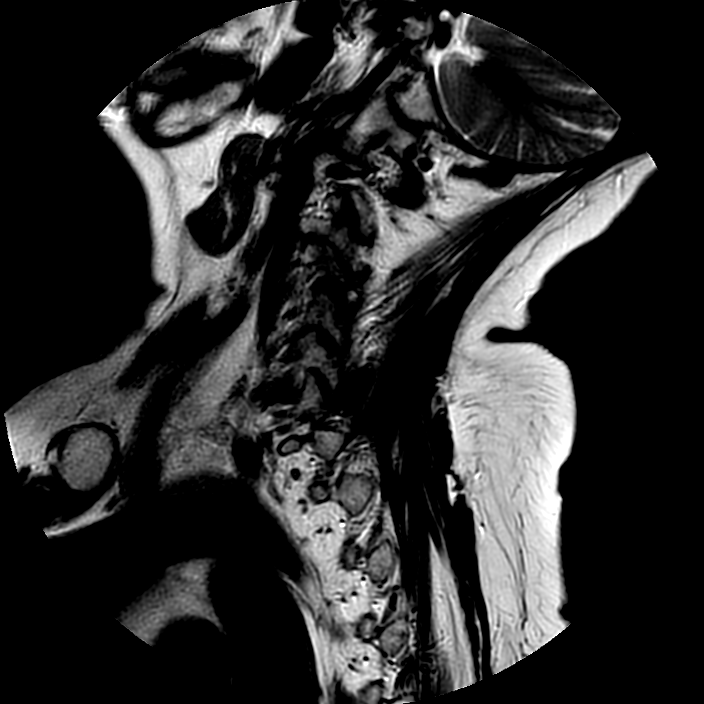

[Series 501: stir_sag · sagittal · 3.0mm · 0.42mm/px · 3 of 15 slices shown]
[im 1/15]
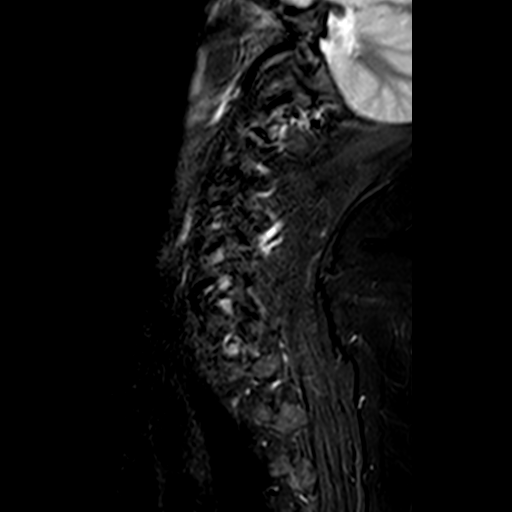
[im 8/15]
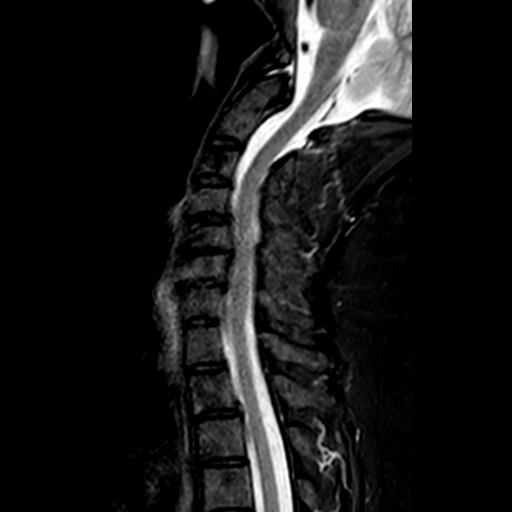
[im 15/15]
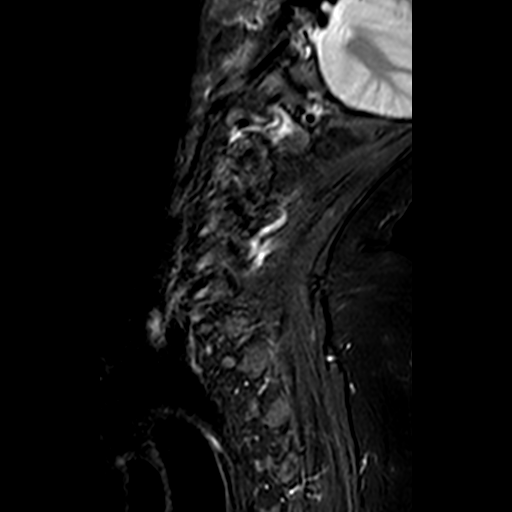

[Series 601: (id)_spine_view_t2w · axial · 2.0mm · 0.34mm/px · z∈[-12,+21]mm · 4 of 100 slices shown]
[im 5/100]
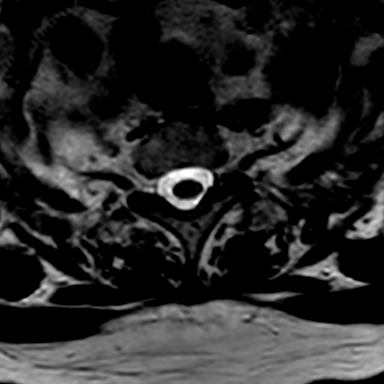
[im 19/100]
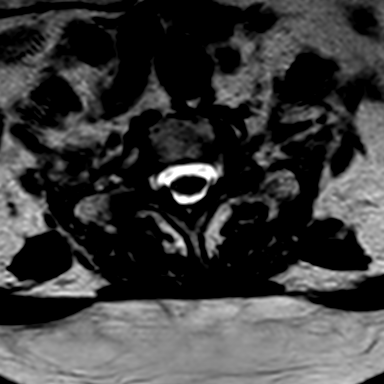
[im 29/100]
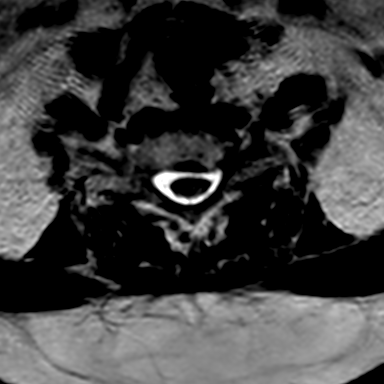
[im 43/100]
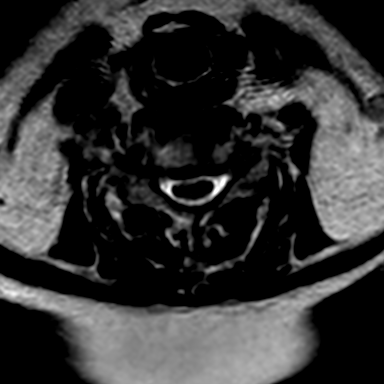

[20 of 48 positions shown; findings below may reference images not displayed]

FINDINGS: -------------------------------------------------------------------------------- 
----------------- 
GENERAL: 
ALIGNMENT: Mild anterolisthesis of C5 on C. Mild retrolisthesis of C6 on C7, C7 
on T1. 
VERTEBRAL BODY HEIGHT: Normal.  
MARROW SIGNAL: No focal suspect signal abnormality. 
CORD SIGNAL: Normal.  
ADDITIONAL FINDINGS: None. 
-------------------------------------------------------------------------------- 
---------------- 
SEGMENTAL: 
CRANIOCERVICAL JUNCTION: No significant stenosis. 
C2-C3: Left facet hypertrophy. Left uncovertebral joint hypertrophy. No 
significant central canal or right neural foraminal narrowing. Mild left neural 
foraminal narrowing. 
C3-C4: Disc osteophyte complex with bilateral uncovertebral joint hypertrophy. 
Left facet hypertrophy. No significant central canal narrowing. Mild right 
neural foraminal narrowing. Severe left neural foraminal narrowing. 
C4-C5: Disc osteophyte complex eccentric to the right side with right 
uncovertebral joint hypertrophy. Uncovering of the disc space. Right facet 
hypertrophy. Mild right-sided central canal narrowing. No significant left 
neural foraminal narrowing. Severe right neural foraminal narrowing. 
C5-C6: Disc osteophyte complex with bilateral uncovertebral joint hypertrophy. 
Deformity of the ventral cord with mild to moderate central canal narrowing. 
Severe right and moderate to severe left neural foraminal narrowing. 
C6-C7: Disc osteophyte complex with left uncovertebral joint hypertrophy. Mild 
right uncovertebral joint hypertrophy. Mild left-sided central canal narrowing. 
Severe left neural foraminal narrowing. Mild right neural foraminal narrowing. 
C7-T1: Right uncovertebral joint hypertrophy. Right facet hypertrophy greater 
than left. No significant central canal narrowing. Severe right neural foraminal 
narrowing. No significant left neural foraminal narrowing. 
-------------------------------------------------------------------------------- 
---------------
IMPRESSION: 1.  Discogenic/degenerative changes as above. Motion degraded exam. 
2.  Worst level(s) of central canal narrowing: C6-C7 (cord deformity) 
3.  Worst level(s) of neural foraminal narrowing: C3-C4 (severe left), C4-C5 
(severe right), C5-C6 (severe right), C6-C7 (severe left), C7-T1 (severe right).

## 2022-05-18 IMAGING — MR MRI LUMBAR SPINE WITHOUT CONTRAST
4 of 9 series · 9 of 48 positions shown · IV contrast (gadolinium)
Comparison: None

________________________________________________________________________________________________ 
MRI LUMBAR SPINE WITHOUT CONTRAST, 05/18/2022 [DATE]: 
CLINICAL INDICATION: Radiculopathy
TECHNIQUE: Sagittal T1, Sagittal T2, Sagittal STIR, Axial T1 and Axial T2 MR 
images of the lumbar spine were performed without intravenous gadolinium 
enhancement.

[Series 1001: t2w_cor-surv · coronal · 6.0mm · 0.60mm/px · 1 of 5 slices shown]
[im 1/5]
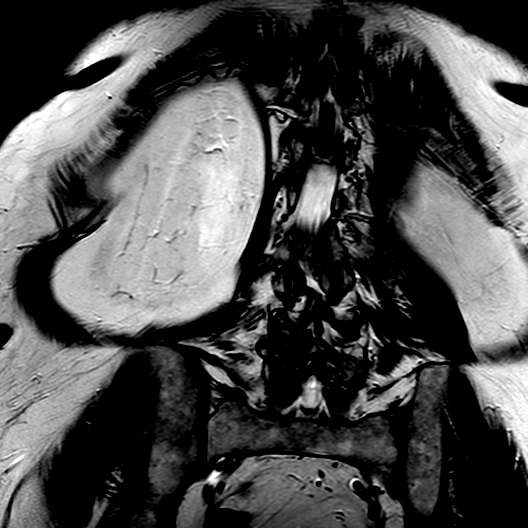

[Series 1101: 3d_spine_view_t2w · sagittal · 1.4mm · 0.44mm/px · 3 of 90 slices shown]
[im 9/90]
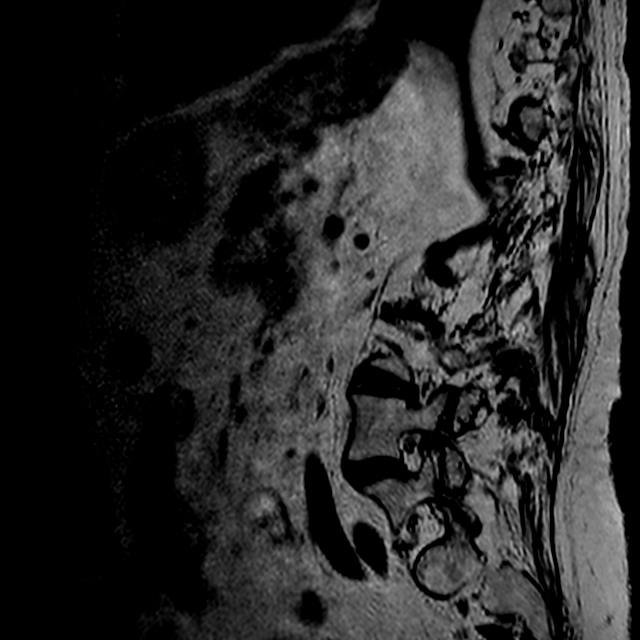
[im 45/90]
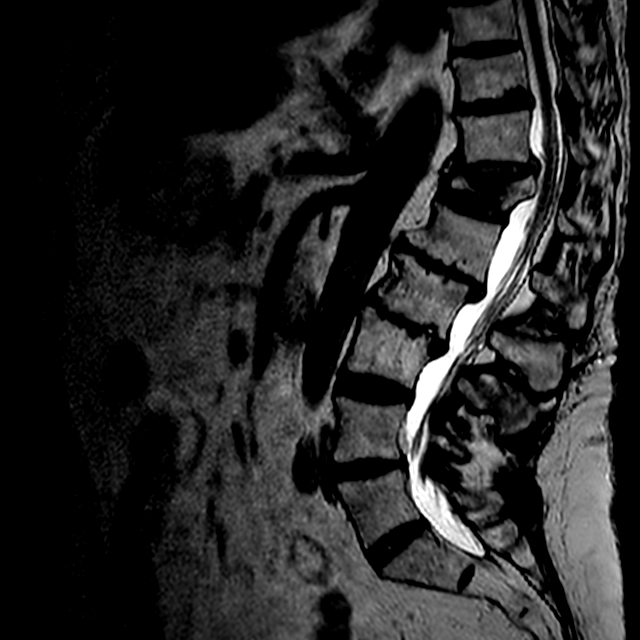
[im 81/90]
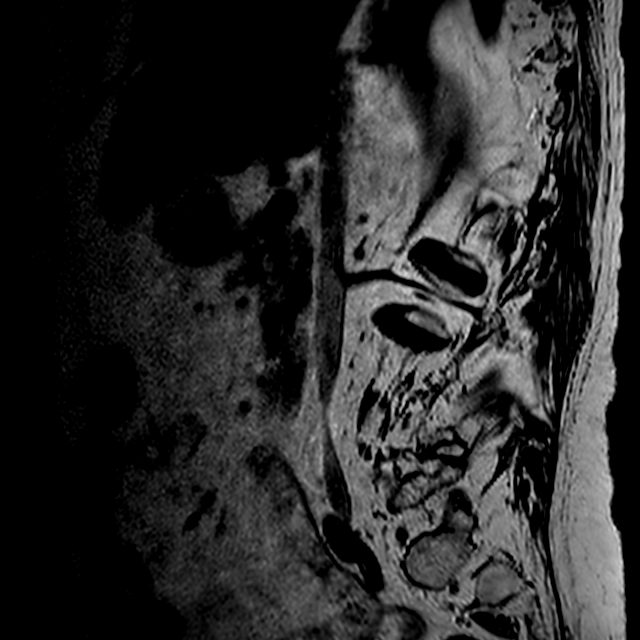

[Series 1102: v3d_spine_view_t2w · axial · 2.0mm · 0.27mm/px · z∈[-326,-226]mm · 2 of 126 slices shown]
[im 17/126]
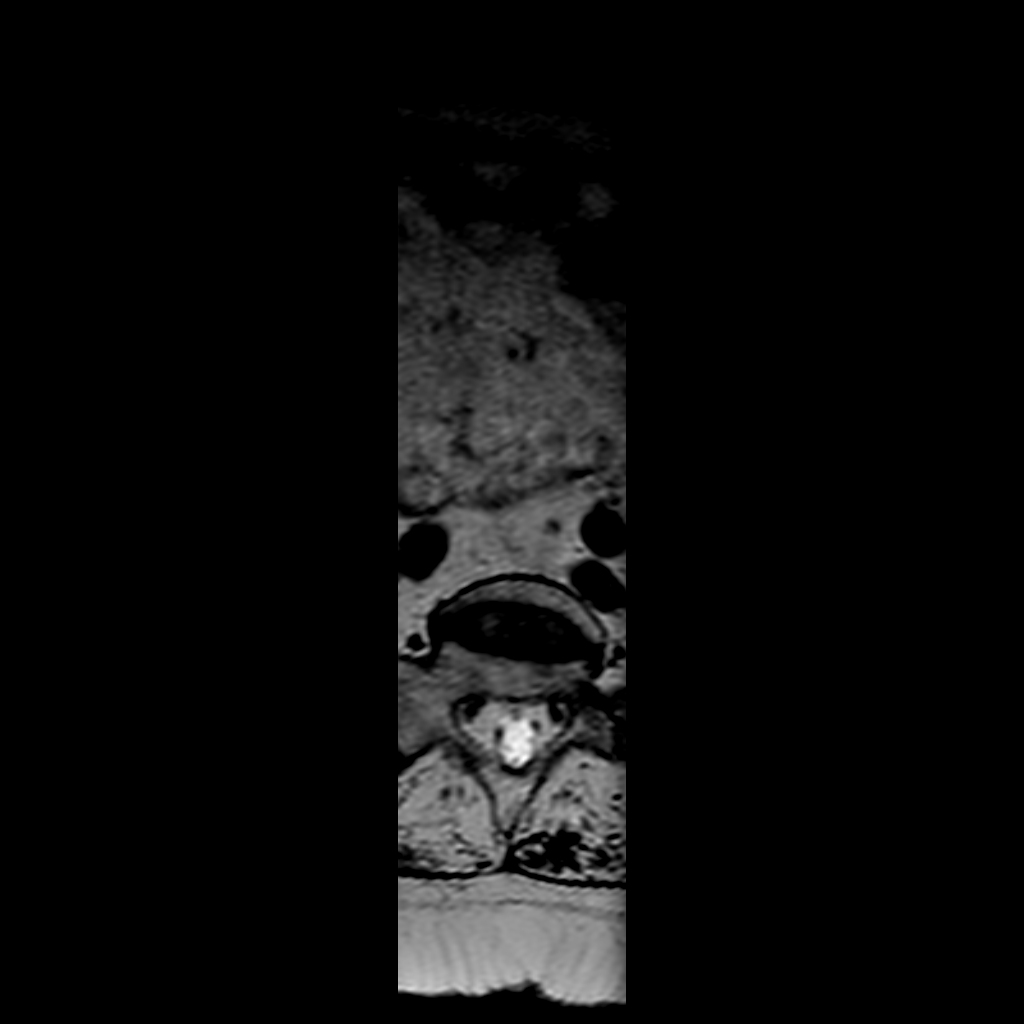
[im 67/126]
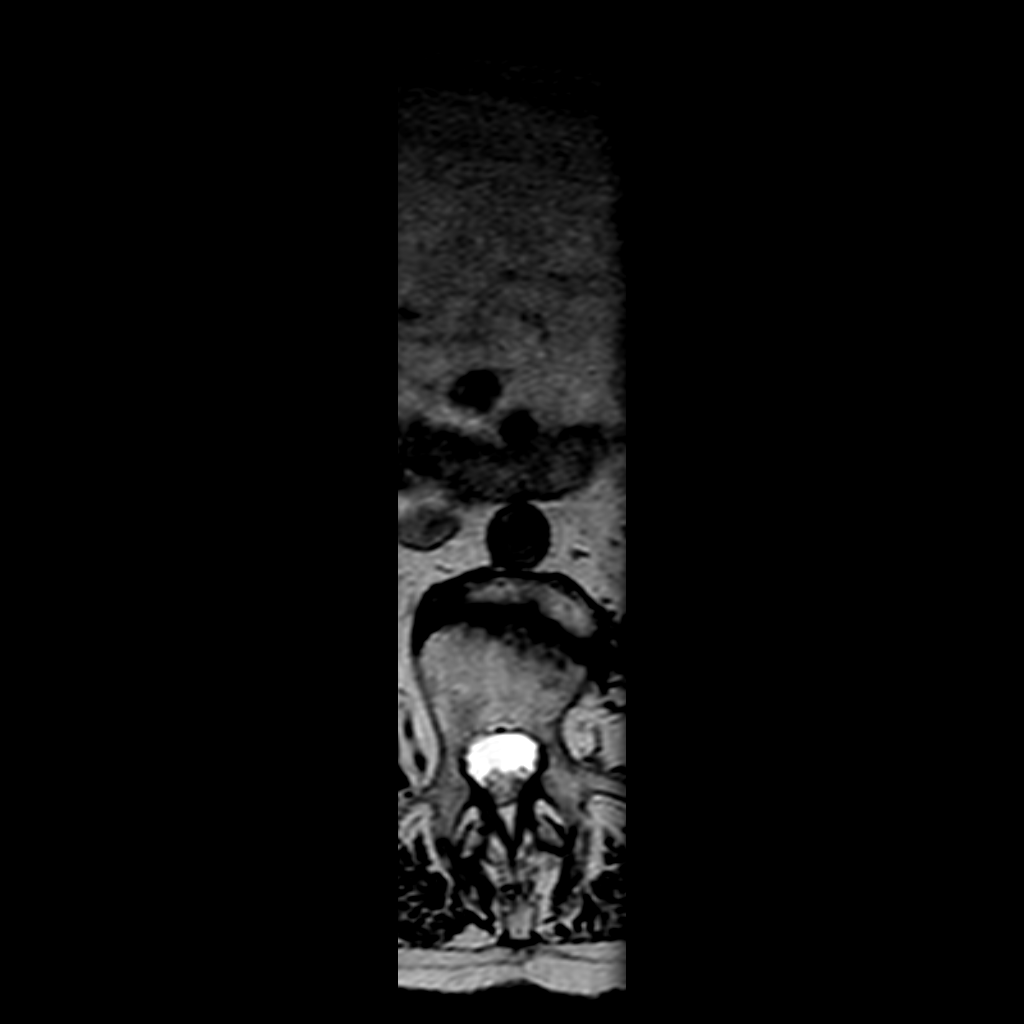

[Series 1501: T1 · axial · 4.0mm · 0.38mm/px · z∈[-367,-156]mm · 3 of 25 slices shown]
[im 1/25]
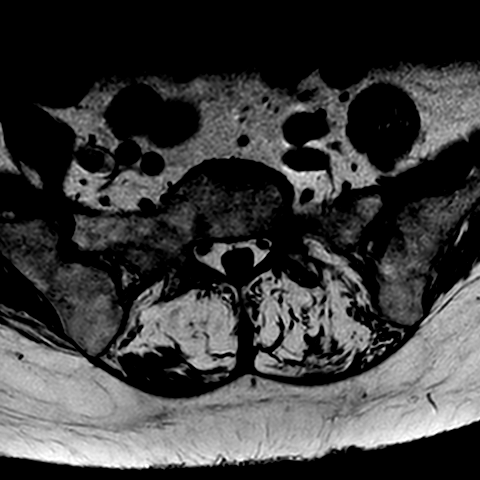
[im 13/25]
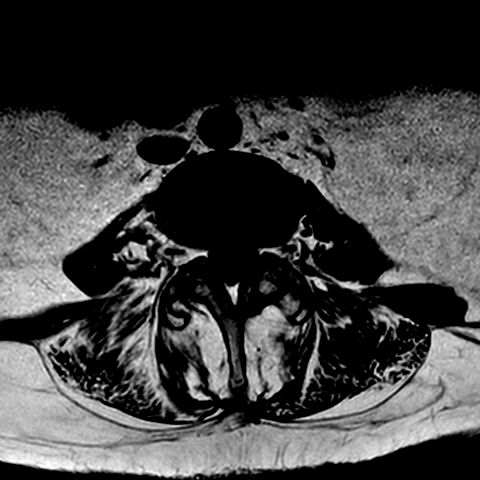
[im 25/25]
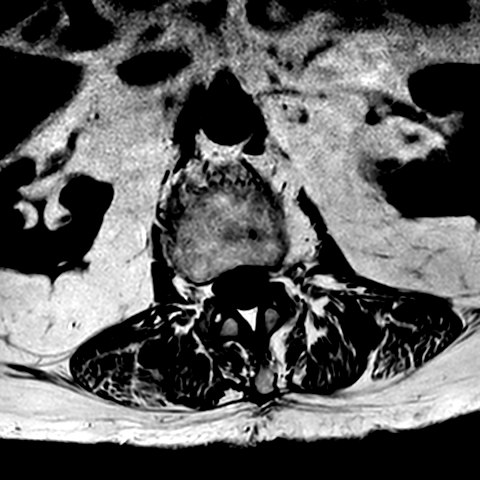

[9 of 48 positions shown; findings below may reference images not displayed]

FINDINGS: There is an 80% compression fracture of T12. There is retropulsion 
along the posterosuperior endplate measuring 5 mm, indenting the thecal sac, 
approximating the conus. There is mild canal stenosis at this level. Conus tip 
lies just below the retropulsed bone. No intramedullary signal abnormality. 
There is edema within the T12 segment consistent with recent onset. 
At L1-2 there is less than grade 1 retrolisthesis. There is broad-based disc 
bulge, with component of left posterolateral extrusion impinging the left L2 
nerve root. 
At L2-3 there is broad-based disc bulge and retrolisthesis effacing the ventral 
thecal sac, encroaching on the L3 lateral recesses, worse on the left. There has 
been left laminectomy at this level. There is moderate left foraminal stenosis. 
At L3-4, L4-5 and L5-S1 the canal is open. 
No specific findings to indicate malignancy. There is mild upper lumbar 
dextroscoliosis.
IMPRESSION: 80% relatively recent compression fracture of T12. Retropulsion approximates the 
lower conus, contributing to mild canal stenosis. There is right paracentral 
disc bulge at T11-12 contributing to mild right-sided canal stenosis.  
Broad-based disc bulge with left posterolateral extrusion at L1-2, impinging the 
exiting left L2 nerve root. Less than grade 1 degenerative retrolisthesis at 
this level. 
Broad-based disc bulge and retrolisthesis at L2-3 encroaching on the L3 lateral 
recesses, worse on the left. There has been left laminectomy. 
No specific findings to indicate malignancy. There is mild lumbar 
dextroscoliosis.

## 2022-05-18 IMAGING — MR MRI THORACIC SPINE WITHOUT CONTRAST
5 of 11 series · 20 of 48 positions shown · IV contrast (gadolinium)
Comparison: None.

________________________________________________________________________________________________ 
MRI THORACIC SPINE WITHOUT CONTRAST, 05/18/2022 [DATE]: 
CLINICAL INDICATION: Radiculopathy, Thoracic Region.
TECHNIQUE: Multiplanar, multiecho position MR images of the thoracic spine were 
performed without intravenous gadolinium enhancement. Patient was scanned on a 
1.5T magnet.

[Series 101: survey · axial · 10.0mm · 1.67mm/px · z∈[-30,+199]mm · 3 of 15 slices shown]
[im 1/15]
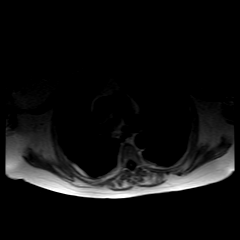
[im 8/15]
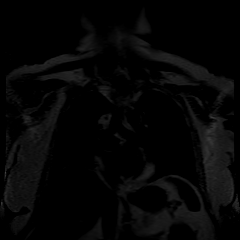
[im 15/15]
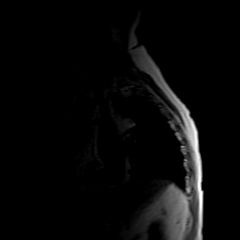

[Series 201: t2w_cor-surv · coronal · 10.0mm · 0.83mm/px · 4 of 20 slices shown]
[im 1/20]
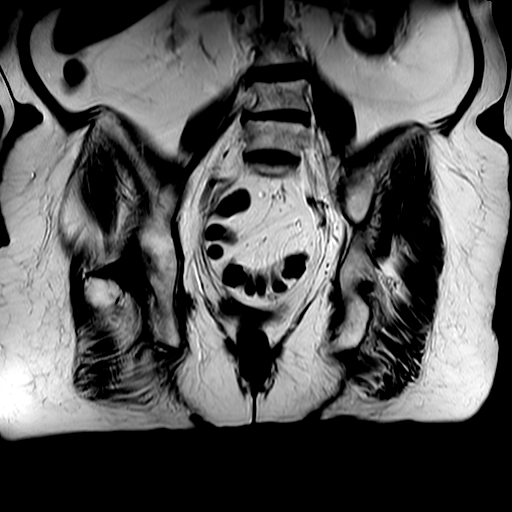
[im 7/20]
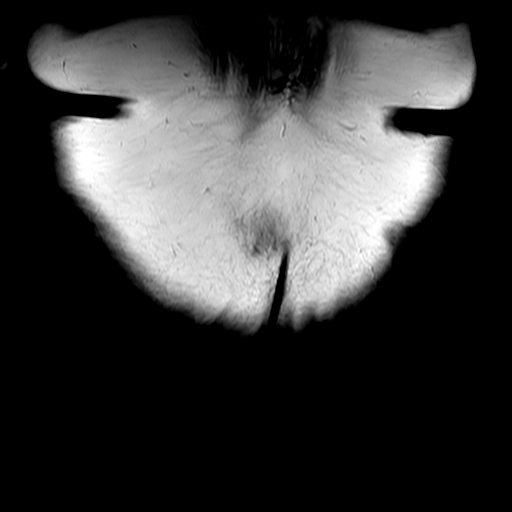
[im 13/20]
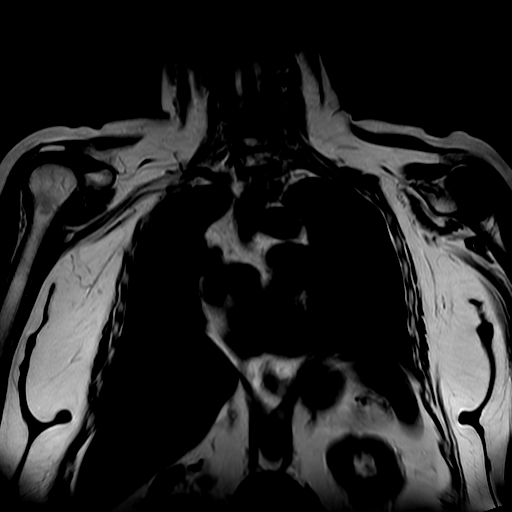
[im 20/20]
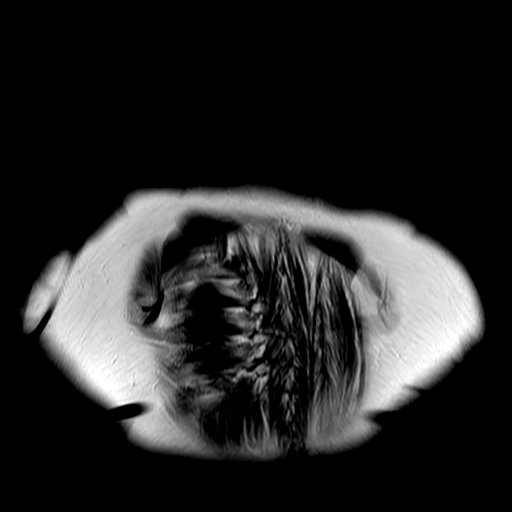

[Series 303: T1 · sagittal · 5.5mm · 0.66mm/px · 3 of 15 slices shown]
[im 1/15]
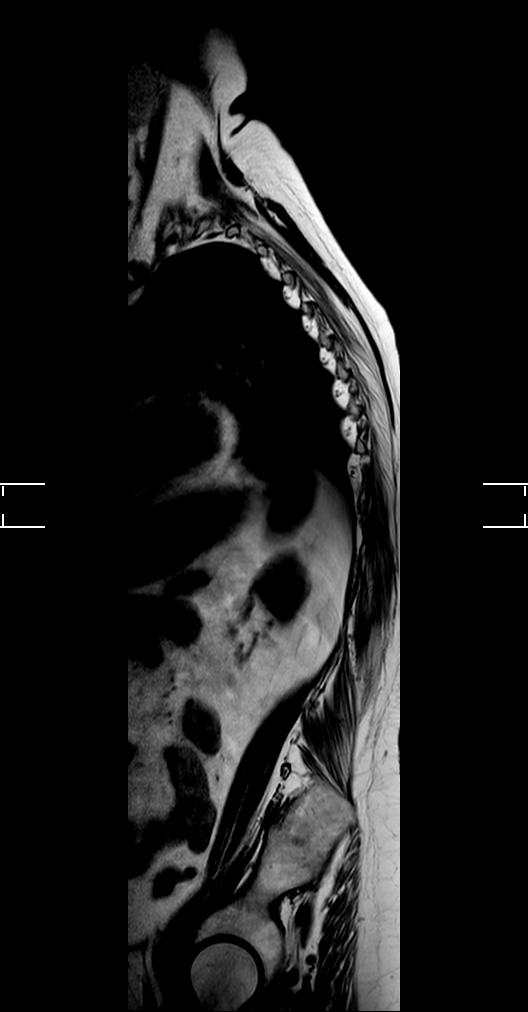
[im 8/15]
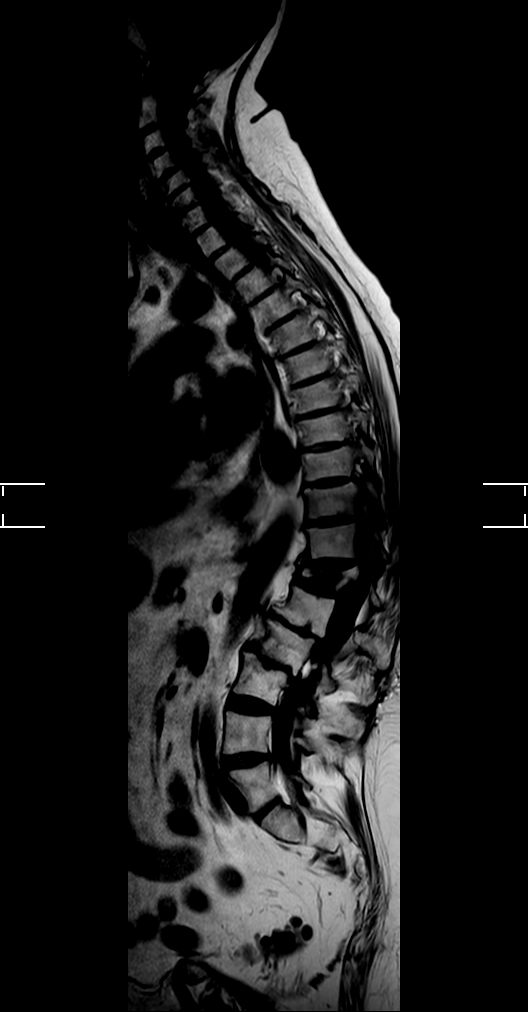
[im 15/15]
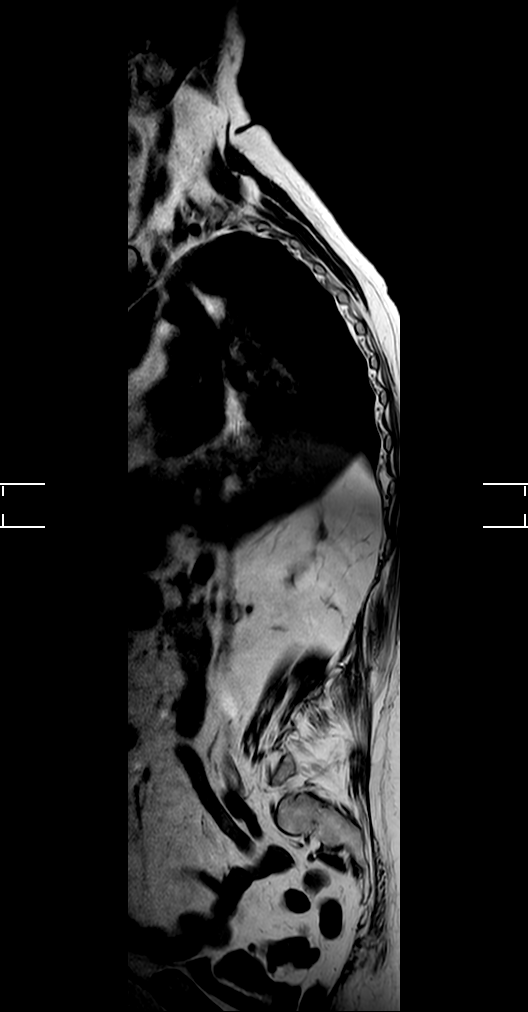

[Series 601: t2_(person_name)_(person_name) · axial · 4.0mm · 0.42mm/px · z∈[-85,+45]mm · 7 of 32 slices shown (1 of 2)]
[im 1/32]
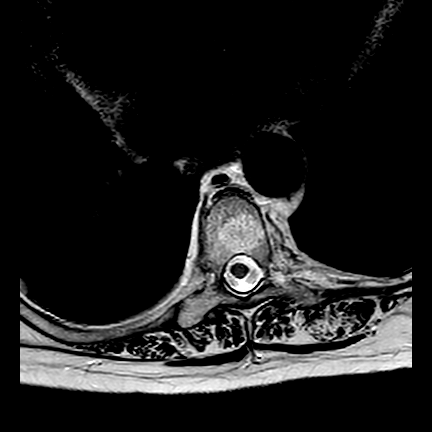
[im 6/32]
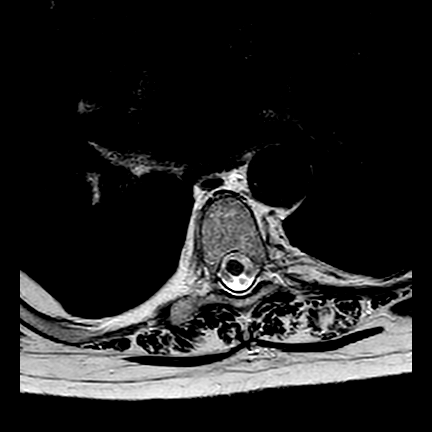
[im 11/32]
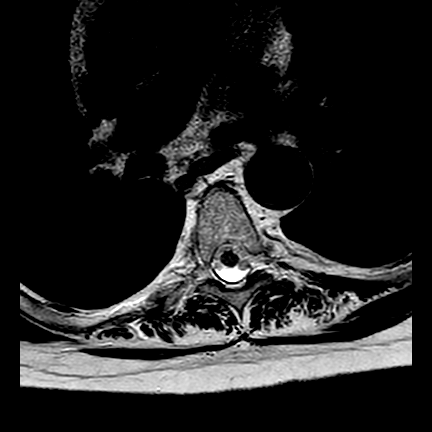
[im 16/32]
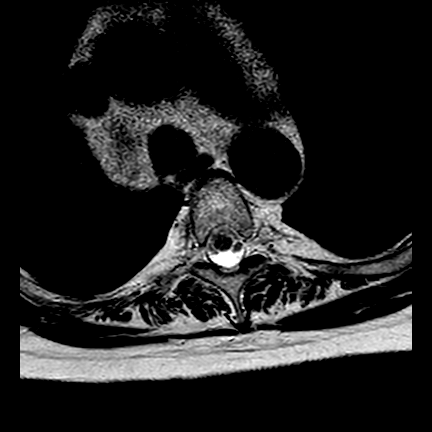
[im 21/32]
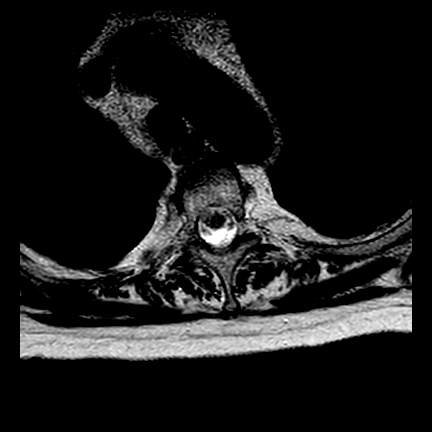
[im 26/32]
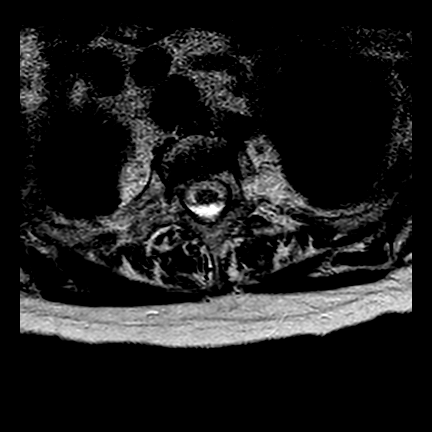
[im 32/32]
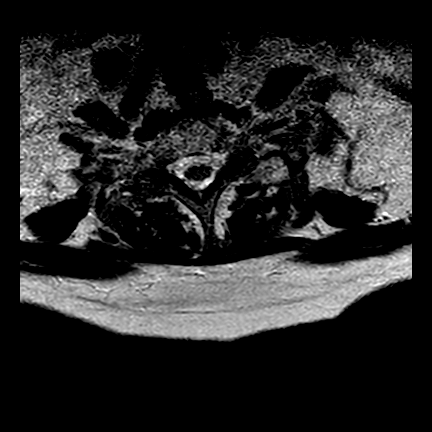

[Series 602: t2_(person_name)_(person_name) · axial · 4.0mm · 0.42mm/px · z∈[-185,-137]mm · 3 of 34 slices shown (2 of 2)]
[im 1/34]
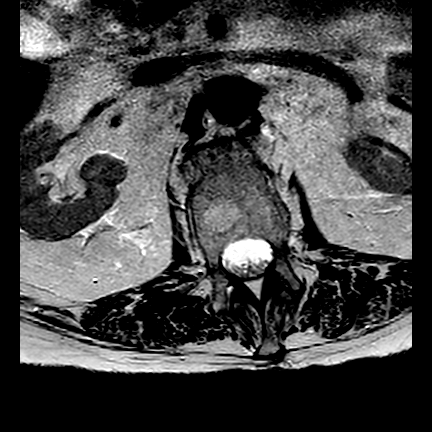
[im 6/34]
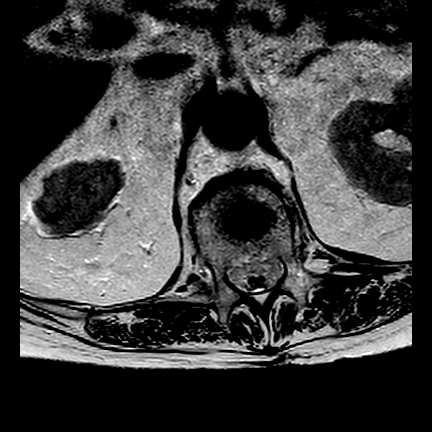
[im 12/34]
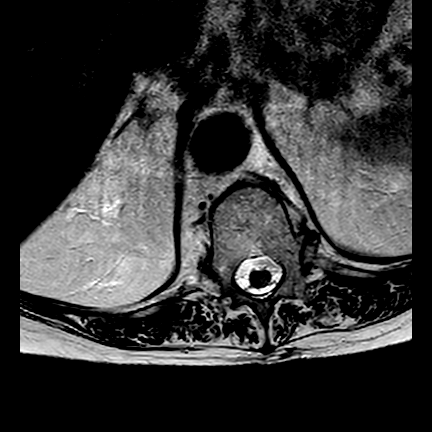

[20 of 48 positions shown; findings below may reference images not displayed]

FINDINGS: -------------------------------------------------------------------------------- 
------ 
COMPRESSION FRACTURES: 
Compression fracture of T12. This is severe. Anteriorly T12 measures 1.0 cm, 
centrally 0.8 cm, posteriorly 2.0 cm. This compares to the T11 vertebral body 
which measures 2.0 cm in height and the L1 vertebral body which measures 2.5 cm 
in height. 
There is presence of edema within the compression fracture from acute to 
subacute state. Retropulsion is present resulting in moderate central canal 
narrowing at the T12 level. Retropulsion measures 0.8 cm. 
-------------------------------------------------------------------------------- 
------ 
GENERAL: 
Alignment demonstrates very mild anterolisthesis of T1 on T2. Mild 
anterolisthesis of T2 on T3. Scattered hemangiomas are noted, largest within T7 
and T9. Cord signal in the thoracic spine is normal. Visualized extraspinal soft 
tissues are unremarkable. 
-------------------------------------------------------------------------------- 
------ 
RELEVANT SEGMENTAL (levels with severe stenosis, significant disc herniation or 
findings): 
T2-T3: Central disc herniation. Mild central canal narrowing. No significant 
neural foraminal narrowing. 
T5-T6: Left central to subarticular disc herniation causing deformity of the 
left hemicord. Moderate left-sided central canal narrowing. No significant 
neural foraminal narrowing. 
T6-T7: Left central to subarticular disc herniation causing mild deformity of 
the left ventral cord. Mild left-sided central canal narrowing. No significant 
neural foraminal narrowing. 
T7-T8: Left subarticular disc herniation. No significant central canal 
narrowing. Moderate left lateral recess narrowing. No significant neural 
foraminal narrowing. 
T8-T9: Bilateral paracentral to subarticular disc herniations with mild central 
canal narrowing. Bilateral facet hypertrophy. Moderate bilateral neural 
foraminal narrowing. 
T9-T10: Left subarticular small disc herniation. Bilateral facet hypertrophy. No 
significant central canal narrowing. Moderate left subarticular recess 
narrowing. Mild bilateral neural foraminal narrowing. 
T11-T12: Central to right subarticular disc herniation with moderate right-sided 
and central canal narrowing. Mild deformity of the right ventral cord. Right 
facet hypertrophy. No significant neural foraminal narrowing. 
Additional scattered discogenic/degenerative changes are noted. 
-------------------------------------------------------------------------------- 
------
IMPRESSION: 1.  Acute to subacute T12 compression fracture with moderate central canal 
narrowing from retropulsion. 
2.  Additional extensive discogenic/degenerative changes as above.

## 2022-09-19 IMAGING — MR MRI LUMBAR SPINE WITHOUT CONTRAST
4 of 6 series · 14 of 48 positions shown · IV contrast (gadolinium)
Comparison: MRI thoracic spine September 19, 2022, MRI of thoracic spine May 18, 2022 and MRI lumbar spine May 18, 2022. Comparison to CT chest January 12, 2022.

________________________________________________________________________________________________ 
MRI LUMBAR SPINE WITHOUT CONTRAST, 09/19/2022 [DATE]: 
CLINICAL INDICATION: Low back pain. Known T12 fracture.
TECHNIQUE: Multiplanar, multiecho position MR images of the lumbar spine were 
performed without intravenous gadolinium enhancement. Patient was scanned on a 
3T magnet.

[Series 101: survey · axial · 10.0mm · 1.39mm/px · z∈[-15,+199]mm · 5 of 9 slices shown]
[im 1/9]
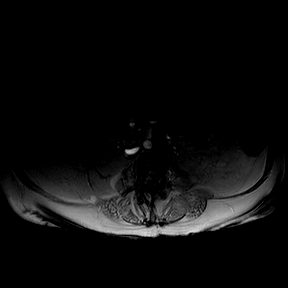
[im 3/9]
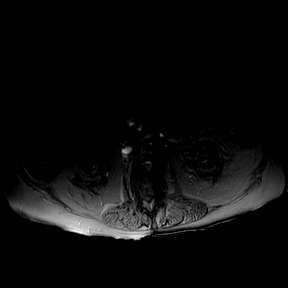
[im 5/9]
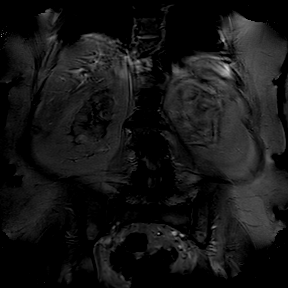
[im 7/9]
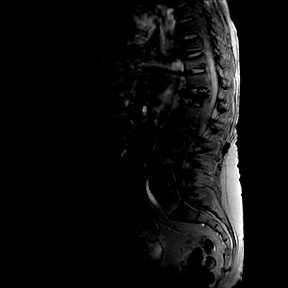
[im 9/9]
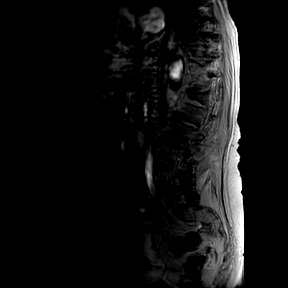

[Series 201: t2w_cor-surv · coronal · 6.0mm · 0.50mm/px · 3 of 5 slices shown]
[im 1/5]
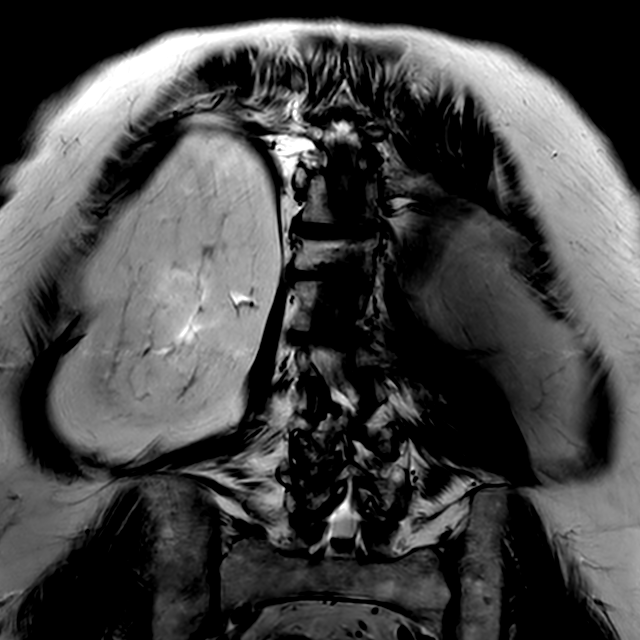
[im 3/5]
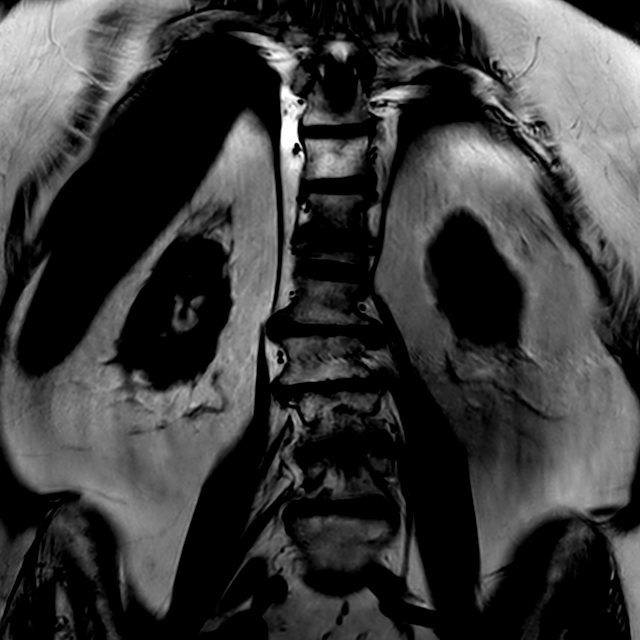
[im 5/5]
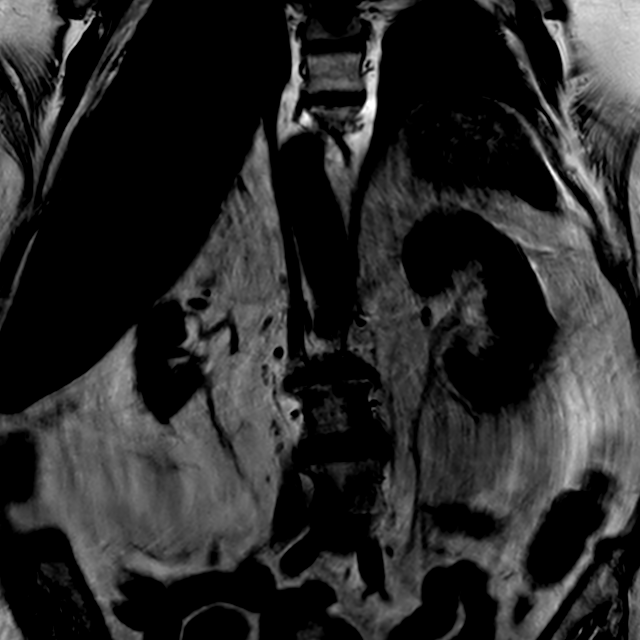

[Series 301: t1w_tse sag · sagittal · 4.0mm · 0.36mm/px · 3 of 17 slices shown]
[im 3/17]
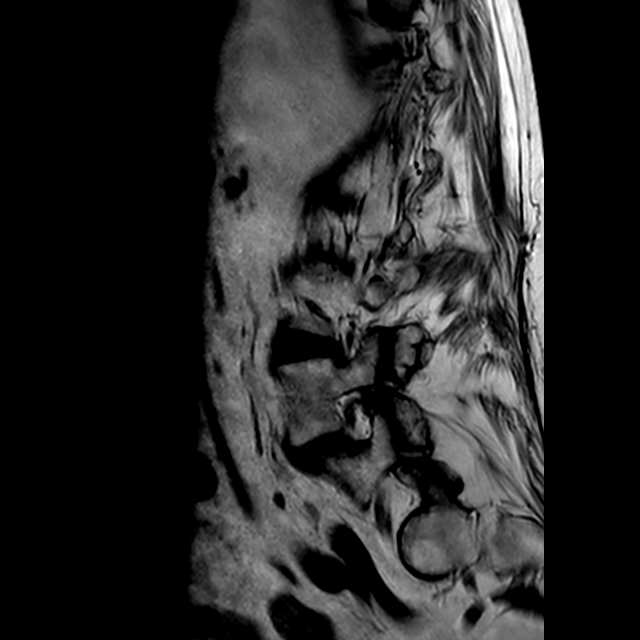
[im 10/17]
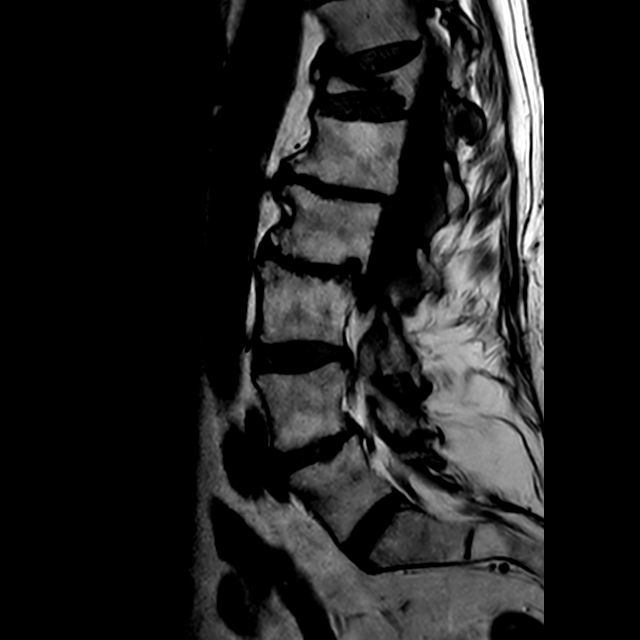
[im 14/17]
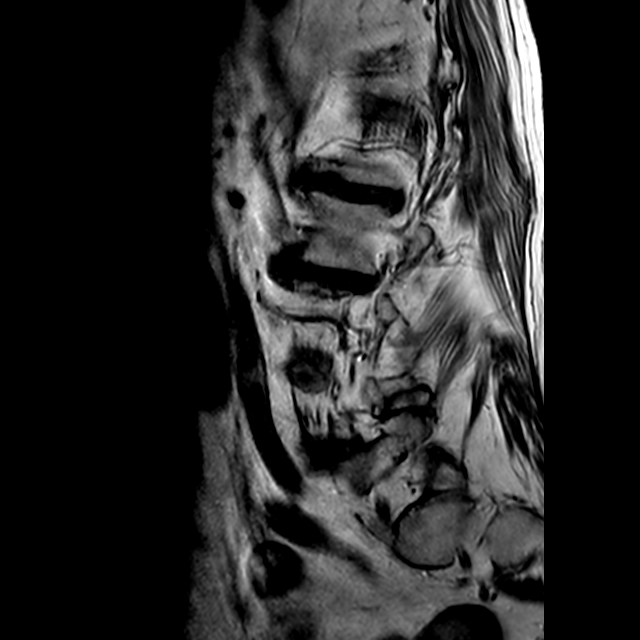

[Series 401: t2w_tse sag · sagittal · 4.0mm · 0.30mm/px · 3 of 17 slices shown]
[im 3/17]
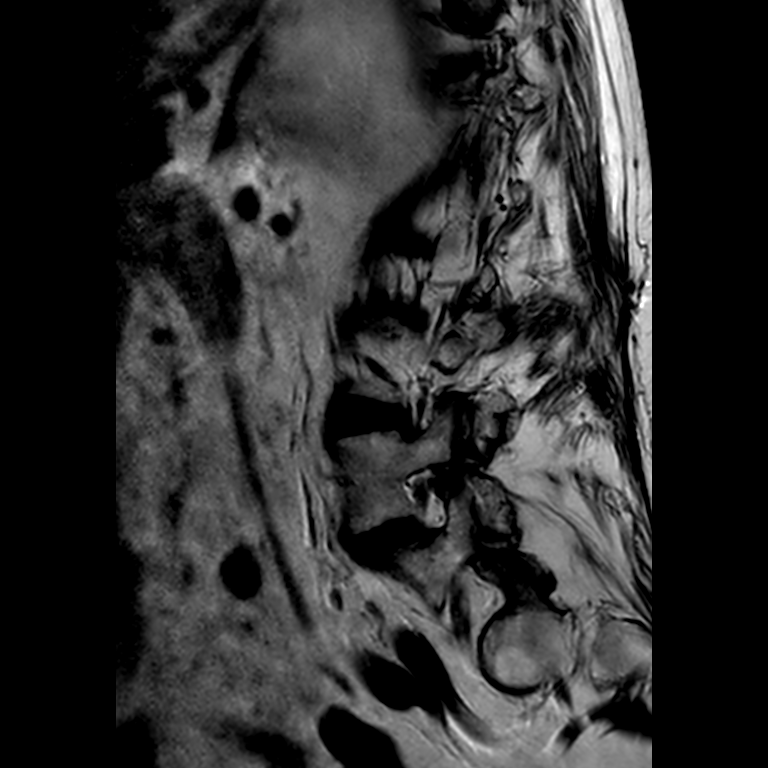
[im 10/17]
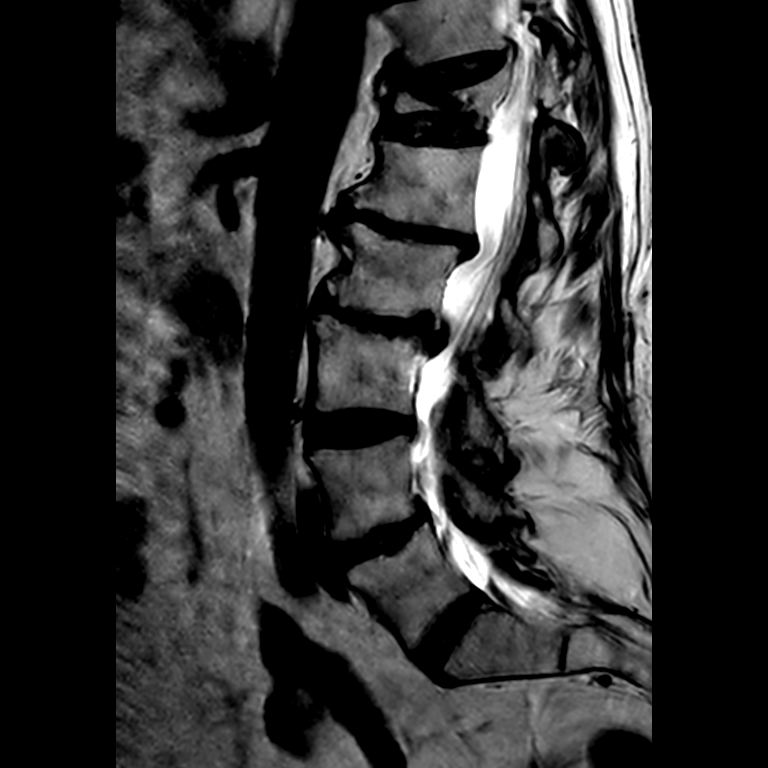
[im 14/17]
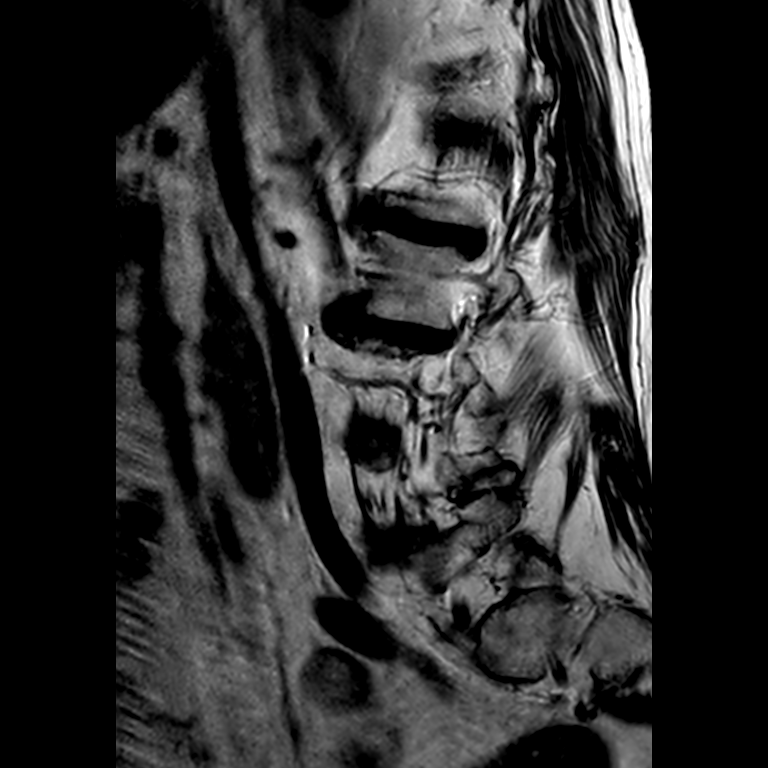

[14 of 48 positions shown; findings below may reference images not displayed]

FINDINGS: Prior CT chest demonstrated 11 rib-bearing thoracic segments. The T12 level 
contains a small right-sided rib. Severe T12 vertebra plana compression fracture 
noted. 
-------------------------------------------------------------------------------- 
------ 
GENERAL: 
Nomenclature is based on 5 lumbar type vertebral bodies.     
ALIGNMENT: Mild rotary thoracic lumbar scoliosis. There is minimal dextroconvex 
thoracic lumbar scoliosis with mild levoconvex lower lumbar scoliosis.. 
Accentuated thoracic lumbar kyphosis is similar to previous MRI lumbar spine 
May 18, 2022. Osseous retropulsion from T12 will be detailed below. There is 
grade 1 retrolisthesis L1 on L2 and L2 on L3 which is stable to previous MRI 
study. 
VERTEBRAL BODY HEIGHT: Chronic appearing stable severe vertebral plana 
compression fracture of T12. 6 mm osseous retropulsion posteriorly does not 
appear significantly changed. Other lumbar vertebral bodies are preserved in 
height. 
MARROW SIGNAL: No focal suspect signal abnormality. 
CORD SIGNAL: Normal distal spinal cord and cauda equina. Conus medullaris 
terminates at L1. 
ADDITIONAL FINDINGS: None. 
Modic I-II: L2-L3, 
Ligamentum Flavum > 2.5 mm: All except the left laminectomy at L2-L3. 
-------------------------------------------------------------------------------- 
------ 
SEGMENTAL: 
T11-T12: Ballooning of the disc. Loss of disc signal. Osseous retropulsion abuts 
the ventral conus with mild/moderate canal stenosis. Foramina patent. Mild facet 
arthropathy. 
T12-L1: Ballooning of the disc. Loss of disc signal. Canal and foramina are 
patent. Small right facet joint effusion. Stable. 
L1-L2: Moderate loss of disc height specifically to the left. Loss of disc 
signal. Marginal osteophytes. Mild diffuse annular bulge. Superimposed left 
paracentral disc extrusion measures 5 mm in AP dimension and effaces portions of 
the left lateral recess. Mild canal stenosis otherwise present. Mild left 
foraminal narrowing. Right foramen patent. Facet arthropathy. Stable. 
L2-L3: Moderate loss of disc height. Loss of disc signal. Right paracentral disc 
extrusion with disc material extending inferiorly slightly narrows the right 
lateral recess. There is also slight narrowing left lateral recess by a small 
left paracentral disc extrusion. Laminectomy change. Canal otherwise patent. 
Mild facet arthropathy. Patent right foramen. Left foramen is moderately 
narrowed. Stable. 
L3-L4: Loss of disc signal. Canal and foramina are patent. Mild facet 
arthropathy. Stable. 
L4-L5: Moderate loss of disc height specifically to the right. Loss of disc 
signal. Canal patent. Left foramen patent. Mild right foraminal narrowing. Facet 
arthropathy with small left facet joint effusion. Stable. 
L5-S1: Loss of disc signal. Canal and foramina are patent. Facet arthropathy. 
-------------------------------------------------------------------------------- 
------
IMPRESSION: Chronic appearing stable severe vertebral plana compression fracture of T12 
demonstrates a similar appearance to the comparison study. Similar osseous 
retropulsion abuts the ventral conus margin with mild to moderate canal 
stenosis. 
Stable multilevel lumbar degenerative changes without critical or significant 
central canal narrowing. 
L1-L2, stable-appearing effacement of the left lateral recess by left 
paracentral disc extrusion. 
L2-L3, stable appearing narrowing of the lateral recesses bilaterally by disc 
extrusions at this level. Laminectomy change.

## 2022-09-19 IMAGING — MR MRI THORACIC SPINE WITHOUT CONTRAST
4 of 7 series · 13 of 48 positions shown · IV contrast (gadolinium)
Comparison: MRI lumbar spine from today. MRI of thoracic and lumbar spine May 18, 2022. CT chest January 12, 2022.

________________________________________________________________________________________________ 
MRI THORACIC SPINE WITHOUT CONTRAST, 09/19/2022 [DATE]: 
CLINICAL INDICATION: Known T12 compression fracture. Mid and lower back pain.
TECHNIQUE: Multiplanar, multiecho position MR images of the thoracic spine were 
performed without intravenous gadolinium enhancement. Patient was scanned on a 
3T magnet.

[Series 101: survey · axial · 10.0mm · 1.14mm/px · z∈[-15,+199]mm · 3 of 9 slices shown]
[im 1/9]
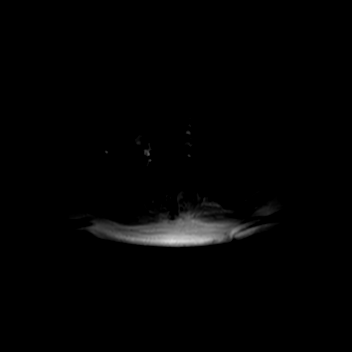
[im 5/9]
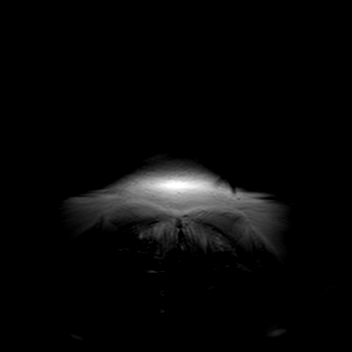
[im 9/9]
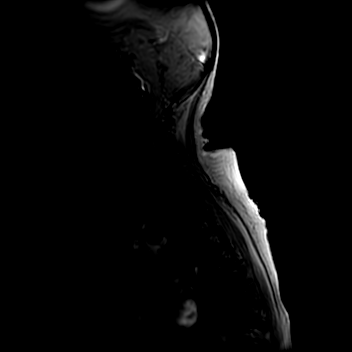

[Series 201: t2w_tse cor · coronal · 6.0mm · 0.52mm/px · 4 of 13 slices shown]
[im 1/13]
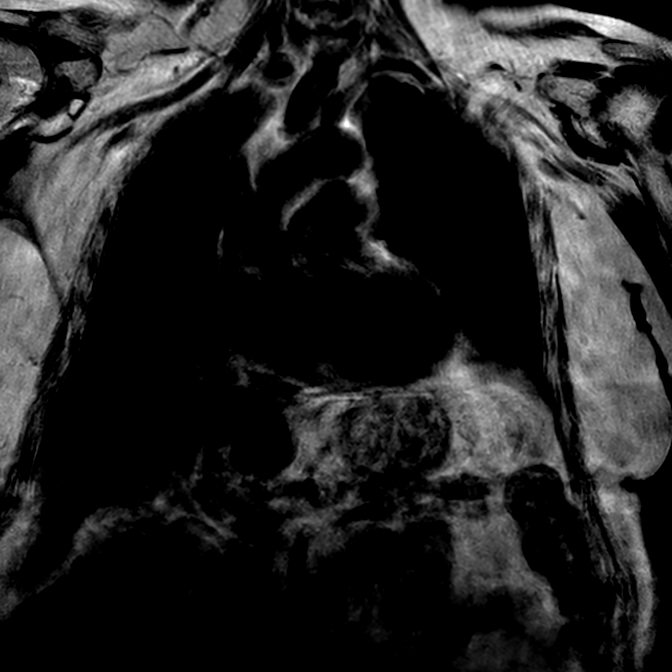
[im 5/13]
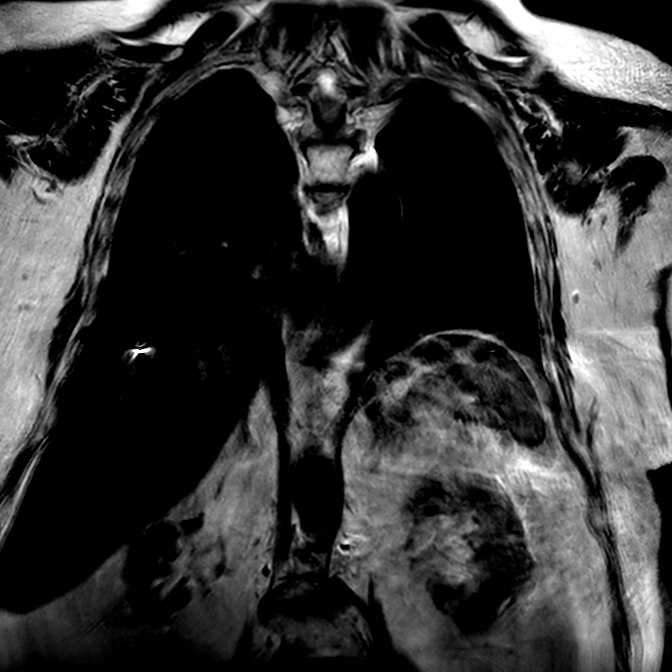
[im 9/13]
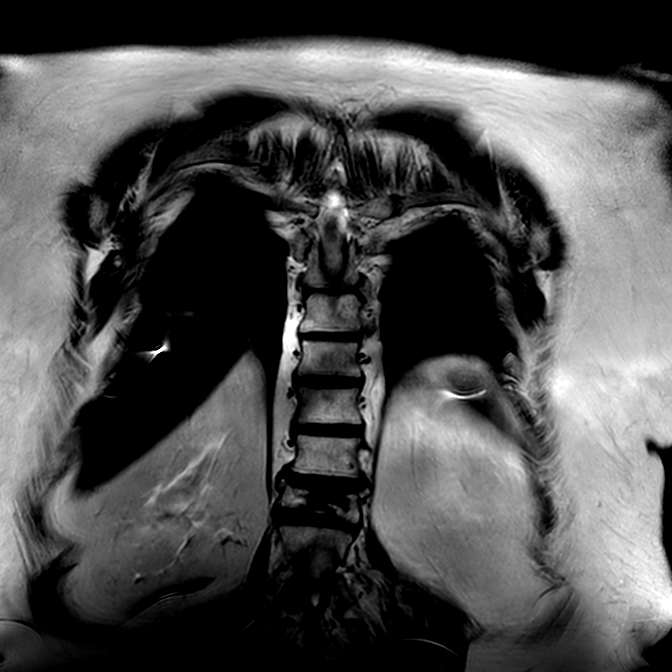
[im 13/13]
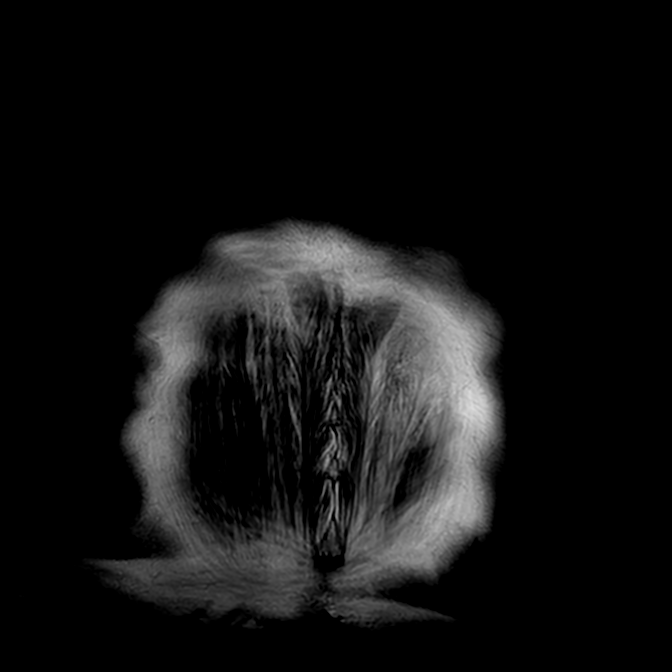

[Series 303: mobiview fused sag · sagittal · 5.5mm · 0.66mm/px · 3 of 15 slices shown]
[im 1/15]
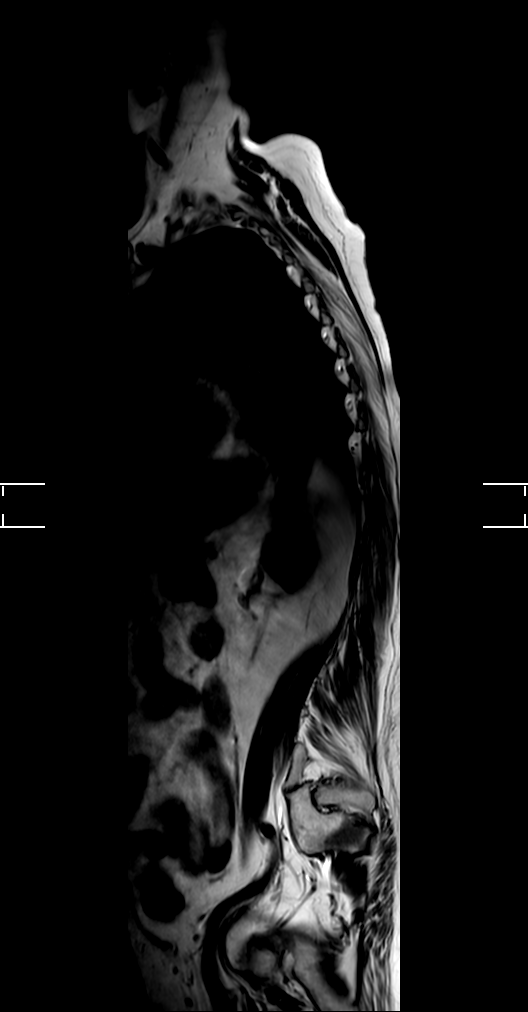
[im 10/15]
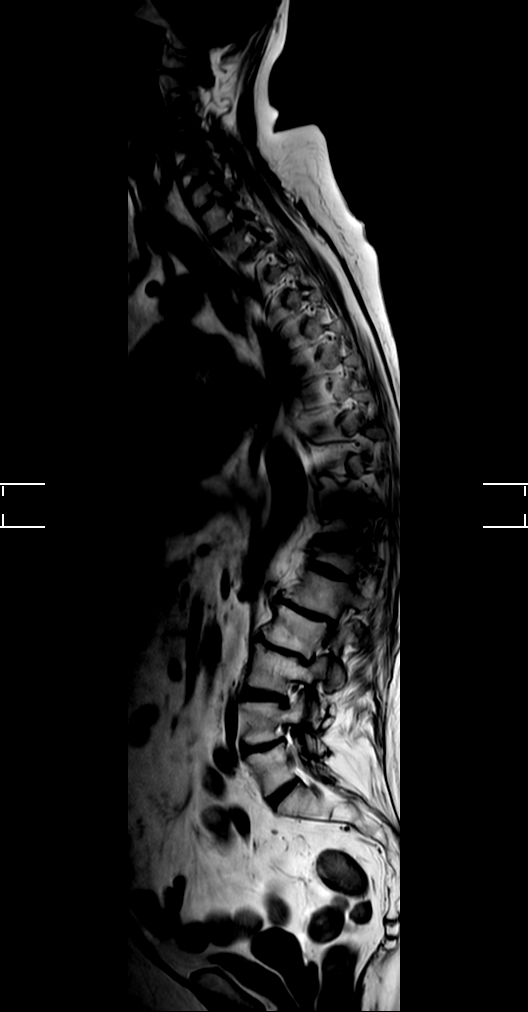
[im 15/15]
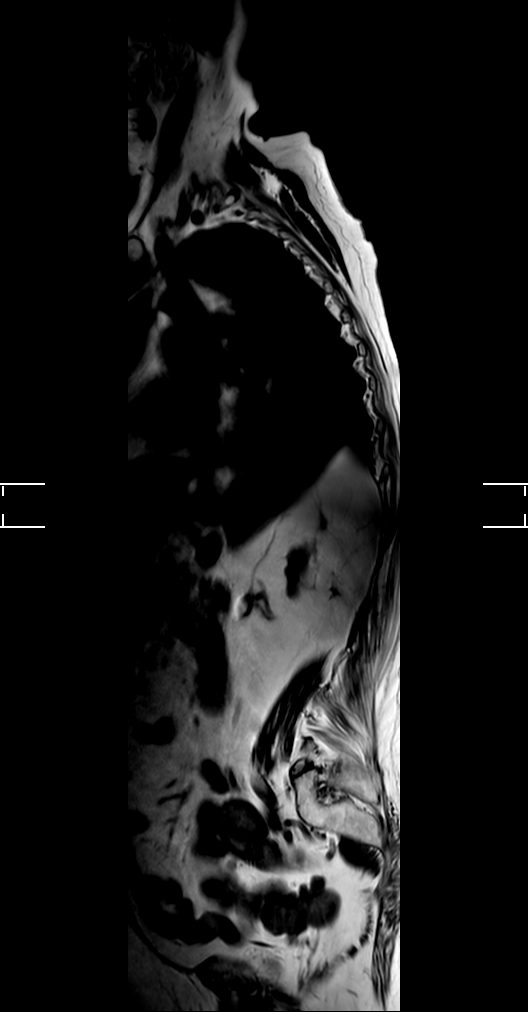

[Series 401: t1w_tse sag · sagittal · 3.0mm · 0.67mm/px · 3 of 21 slices shown]
[im 5/21]
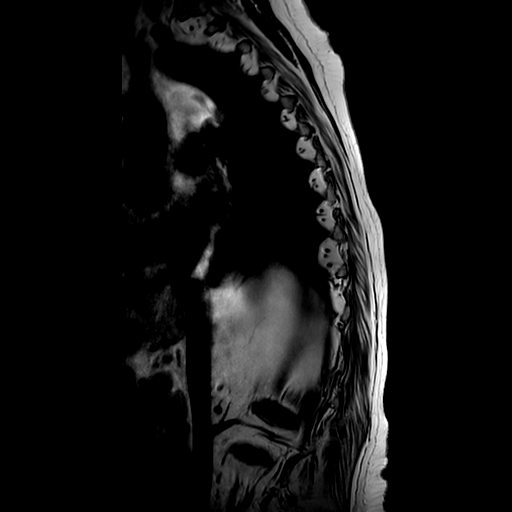
[im 13/21]
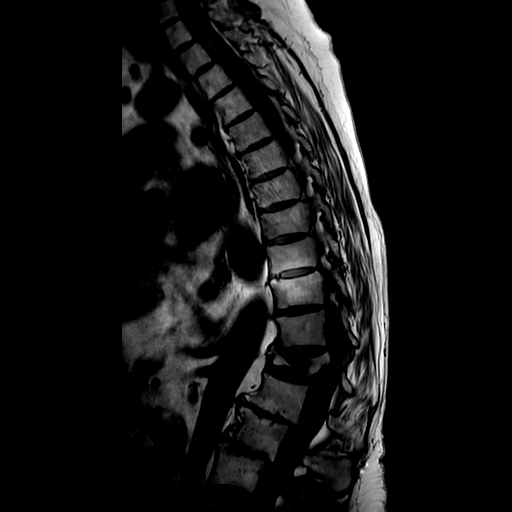
[im 21/21]
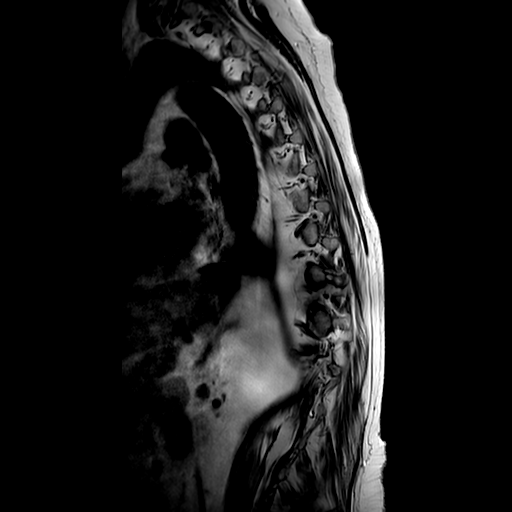

[13 of 48 positions shown; findings below may reference images not displayed]

FINDINGS: 12 thoracic type and 5 lumbar type vertebral bodies. Sagittal localizer 
demonstrates lower cervical degenerative changes. 
-------------------------------------------------------------------------------- 
------ 
GENERAL: 
ALIGNMENT: Accentuated thoracic lumbar kyphosis related to the severe T12 
compression deformity. Osseous retropulsion from T12 all be detailed below. 
VERTEBRAL BODY HEIGHT: Stable appearance to a severe vertebral plana 
configuration compression deformity involving T12. There is, however, less 
remaining edema indicating a chronic component. Stable 5-6 mm osseous 
retropulsion which abuts the ventral conus margin with mild to moderate canal 
stenosis.  
MARROW SIGNAL: There is minimal edema, incompletely visualized, involving the 
left T10 rib. Chest CT from January 12, 2022 demonstrated a subacute healing 
fracture in this region. See image 1 of series 601. T7 hemangioma. T9 
hemangioma. 
CORD SIGNAL: Normal. 
ADDITIONAL FINDINGS: None. 
-------------------------------------------------------------------------------- 
------ 
RELEVANT SEGMENTAL (levels with severe stenosis or significant findings): 
No evidence of critical or significant central canal or foraminal stenosis. 
There is mild multilevel variable degrees of loss of disc height and loss of 
disc signal. Ballooning of the T11-T12 and T12-L1 discs noted. There is a small 
sized central disc extrusion at T2-T3 which is stable. Left paracentral disc 
extrusion T5-T6 is stable which slightly narrows the left lateral recess. Mild 
canal stenosis at this level. Left paracentral disc extrusion T6-T7 is also 
stable and slightly narrows the left lateral recess. No effective central canal 
narrowing. Small sized left paracentral disc extrusion T7-T8 is also stable. 
Otherwise there are small annular bulges at numerous levels, none of which abut 
or distort the ventral cord margin. No evidence of active facet arthritis.
IMPRESSION: Stable appearance to a severe, vertebral plana configuration compression 
deformity involving T12 with stable osseous retropulsion. However, there is less 
remaining marrow edema indicating a chronic component. 
Minimal edema, incompletely visualized, is present involving the left T10 rib. 
Prior chest CT demonstrated a subacute healing fracture in this region. 
Stable thoracic degenerative changes detailed above.

## 2023-02-19 IMAGING — CT CT CHEST WITHOUT CONTRAST
2 of 4 series · 14 of 36 positions shown, 17 images · non-contrast
Comparison: 01/12/2022

________________________________________________________________________________________________ 
CT CHEST WITHOUT CONTRAST, 02/19/2023 [DATE]: 
CLINICAL INDICATION: Pulmonary fibrosis. 
A search for DICOM formatted images was conducted for prior CT imaging studies 
completed at a non-affiliated media free facility.
TECHNIQUE: The chest was scanned from base of neck through the lung bases 
without contrast on a high resolution low dose CT scanner. Routine MPR and MIP 
reconstruction images were performed. Count of known CT and Cardiac Nuclear 
Medicine studies performed in the previous 12 months = < 0.>

[Series 2: chest 2.0 i31s 3 · axial · 0.73mm/px · z∈[-275,-11]mm · 11 of 146 slices shown, 14 images]
[im 7/146  mediastinal]
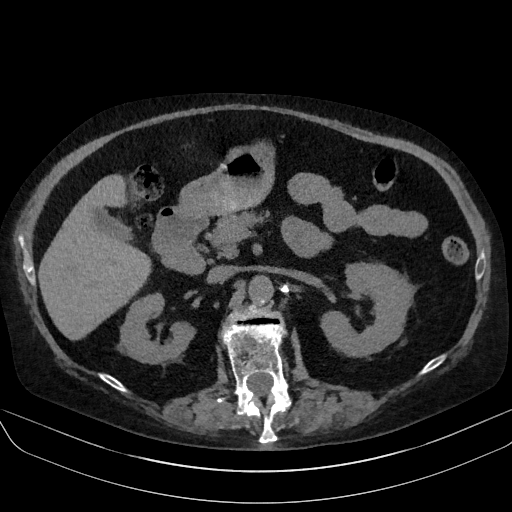
[im 7/146  lung]
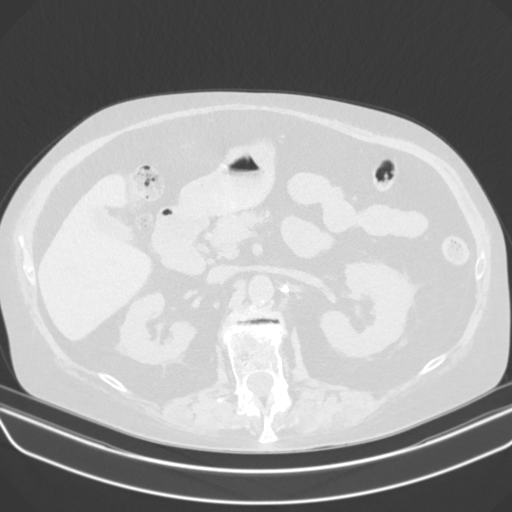
[im 21/146  lung]
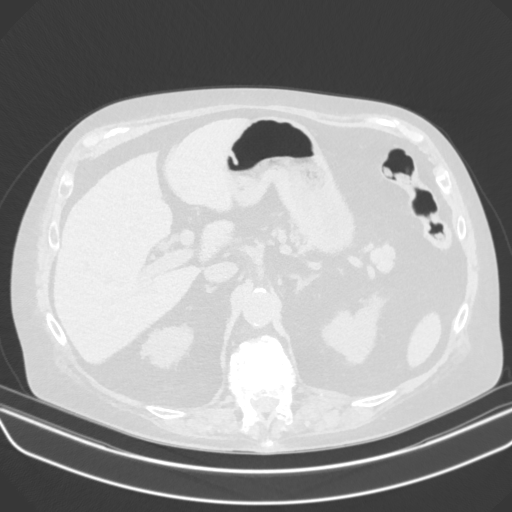
[im 35/146  lung]
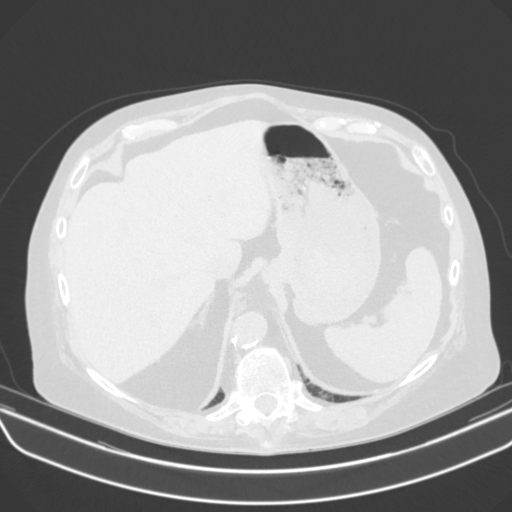
[im 49/146  lung]
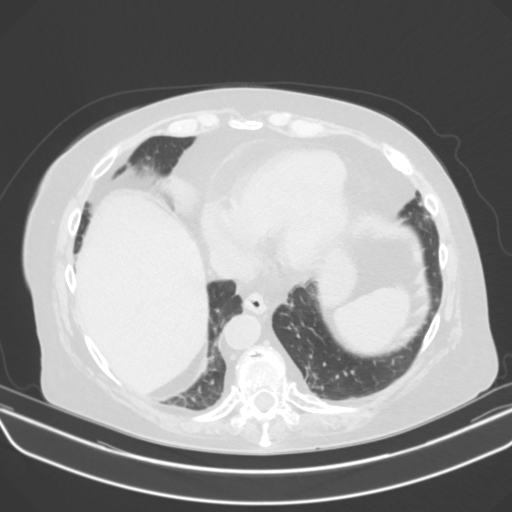
[im 63/146  mediastinal]
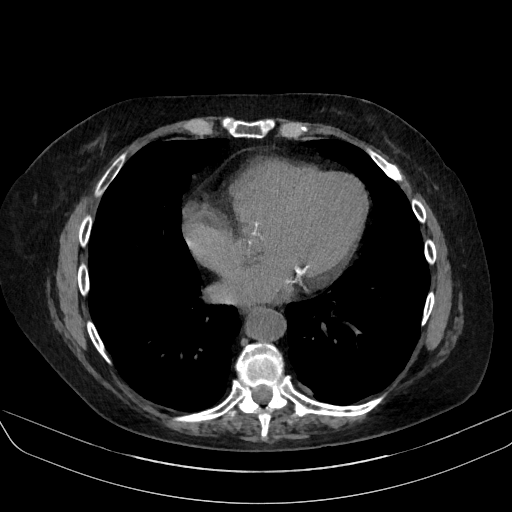
[im 63/146  lung]
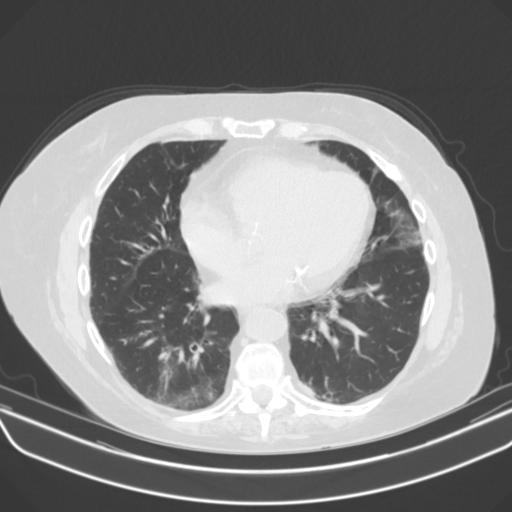
[im 76/146  lung]
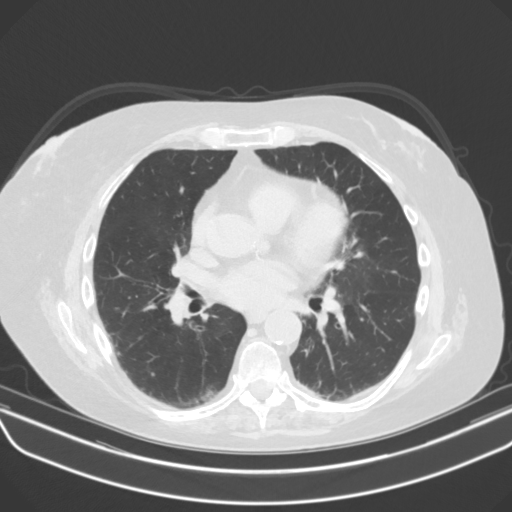
[im 83/146  lung]
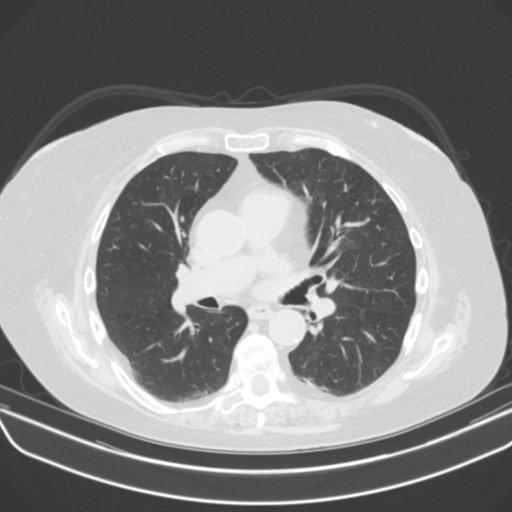
[im 97/146  lung]
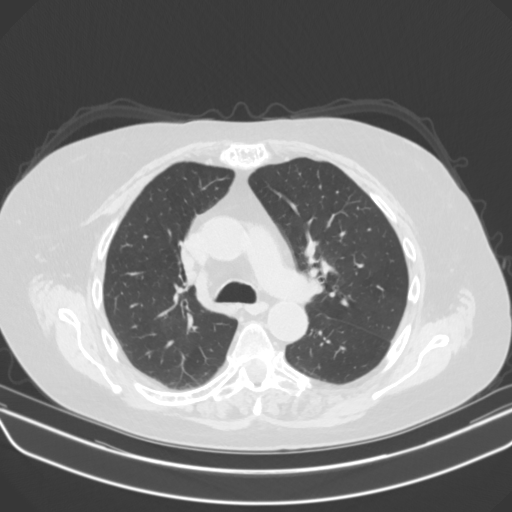
[im 111/146  mediastinal]
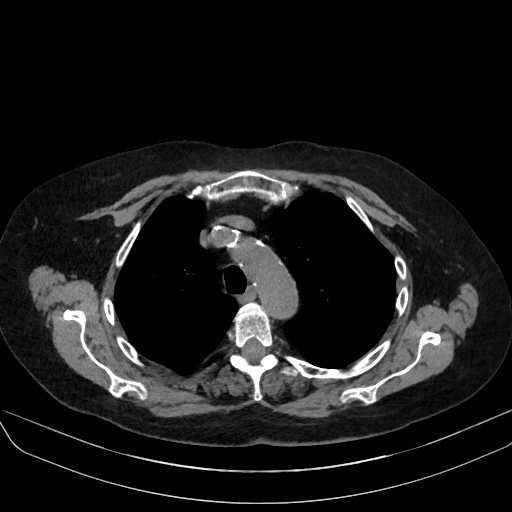
[im 111/146  lung]
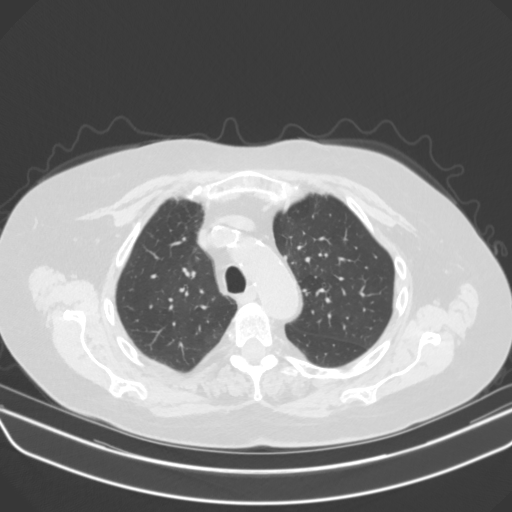
[im 125/146  lung]
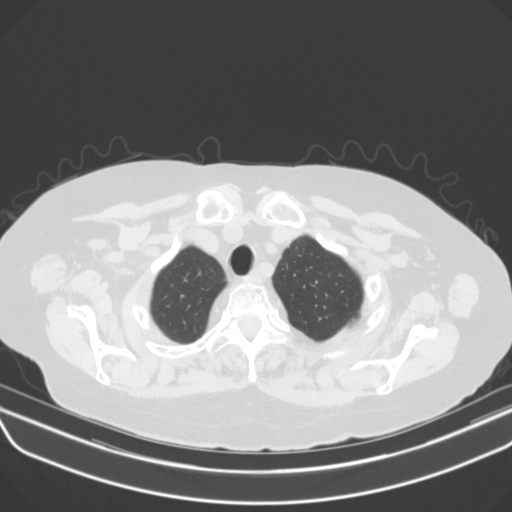
[im 139/146  lung]
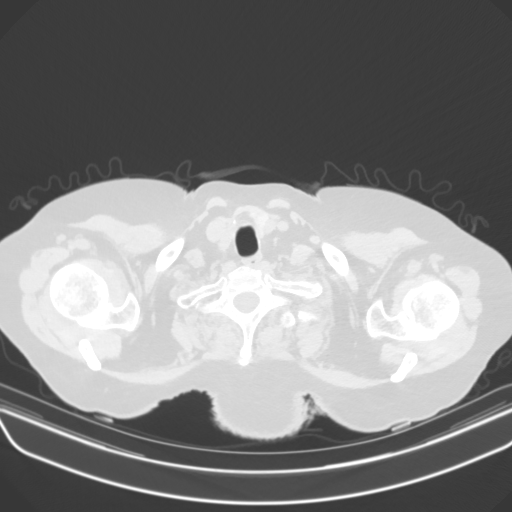

[Series 4: coronal · coronal · 0.59mm/px · 3 of 135 slices shown]
[im 27/135  lung]
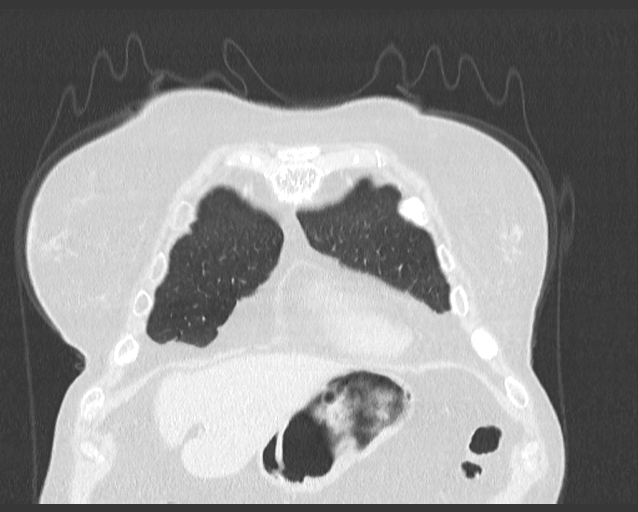
[im 54/135  lung]
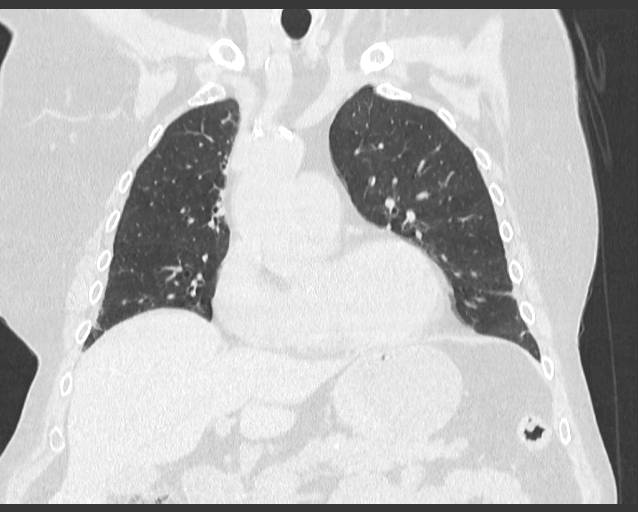
[im 81/135  lung]
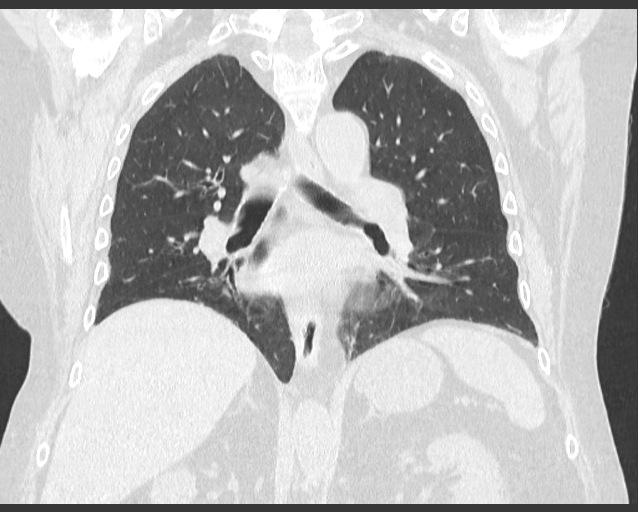

[14 of 36 positions shown; findings below may reference images not displayed]

FINDINGS: LUNGS AND PLEURA:  Stable 10 mm x 7 mm nodular density in the left lower lobe 
which did not have increased uptake on PET and is seen on axial image #61 of 
series #3. Other smaller stable scattered micronodules. No pleural effusion. 
Minor stable pleural thickening in the lower lobes as well as mild stable 
scarring in the lower lobes. Stable peribronchial thickening which is also mild 
and unchanged. 
MEDIASTINUM:  No adenopathy. Normal heart size. No pericardial effusion. Mild 
coronary artery calcifications noted. 
CHEST WALL/AXILLA: No mass or adenopathy. Healing fractures of the right 
anterior sixth and seventh ribs. There are also healing fractures of the left 
anterior fifth and sixth ribs. 
UPPER ABDOMEN: Small hiatal hernia. 
MUSCULOSKELETAL: L1 compression fracture treated with vertebroplasty. Hemangioma 
of the T10 vertebral body with osteopenia of the thoracic spine.
IMPRESSION: Stable 10 mm x 7 mm nodular density left lower lobe which did not have increased 
uptake on PET. Would recommend follow-up CT in one year to complete two-year 
stability. 
Mild stable pleural thickening in the lower lobes which is unchanged as well as 
mild stable scarring in the lower lobes and mild stable peribronchial 
thickening. 
Healing fractures of the right anterior sixth and seventh ribs. There are also 
healing fractures of the left anterior fifth and sixth ribs.L1 compression 
fracture treated with vertebroplasty. Hemangioma of the T10 vertebral body with 
osteopenia of the thoracic spine. May consider DEXA with trabecular bone score 
which can be performed at [facility] for further evaluation and more sensitive at 
detecting osteoporosis/osteopenia. 
RADIATION DOSE REDUCTION: All CT scans are performed using radiation dose 
reduction techniques, when applicable.  Technical factors are evaluated and 
adjusted to ensure appropriate moderation of exposure.  Automated dose 
management technology is applied to adjust the radiation doses to minimize 
exposure while achieving diagnostic quality images.

## 2023-05-24 IMAGING — MG MAMMOGRAPHY SCREENING BILATERAL 3[PERSON_NAME]
8 series · 8 of 24 positions shown · non-contrast
Comparison: 11/10/2021 and 09/14/2021.

________________________________________________________________________________________________ 
MAMMOGRAPHY SCREENING BILATERAL 3[facility], 05/24/2023 [DATE]: 
CLINICAL INDICATION: Encounter for screening mammogram.
TECHNIQUE: Digital bilateral mammograms and 3-D Tomosynthesis were obtained. 
These were interpreted both primarily and with the aid of computer-aided 
detection system.  
BREAST DENSITY: (Level B) There are scattered areas of fibroglandular density.

[L MLO]
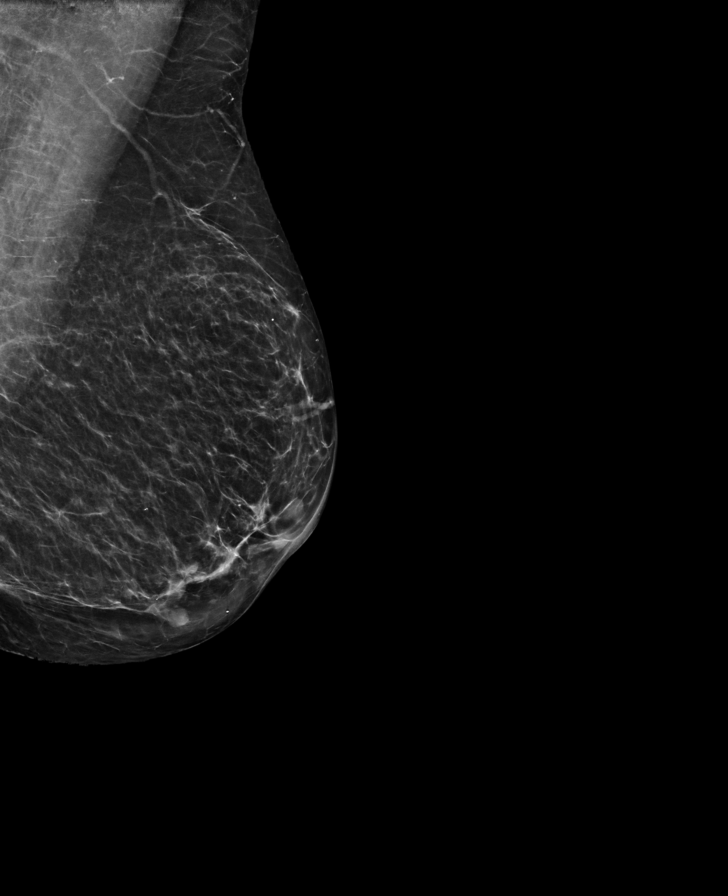

[R MLO]
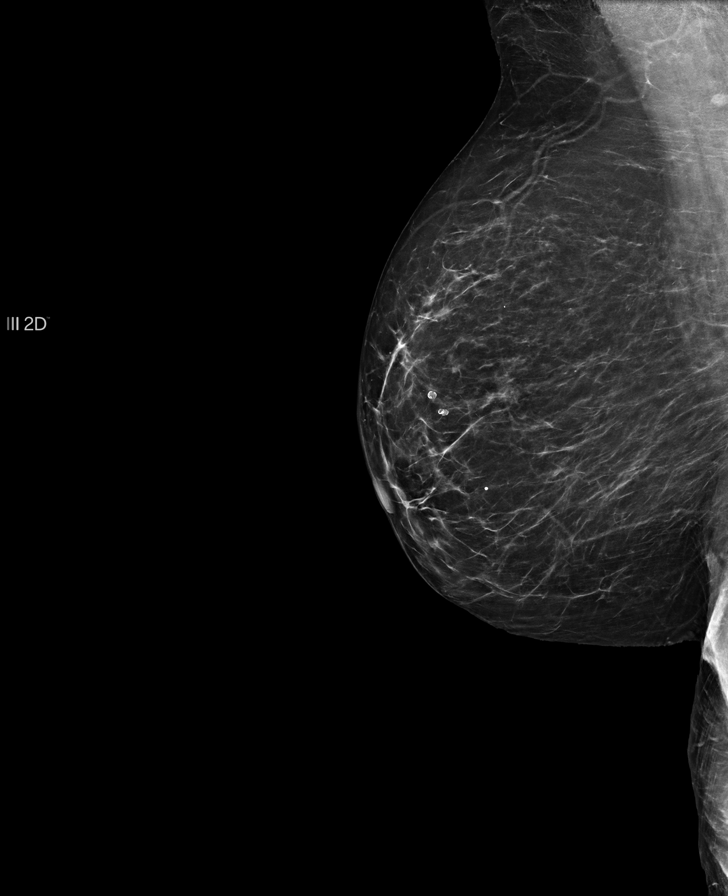

[L CC]
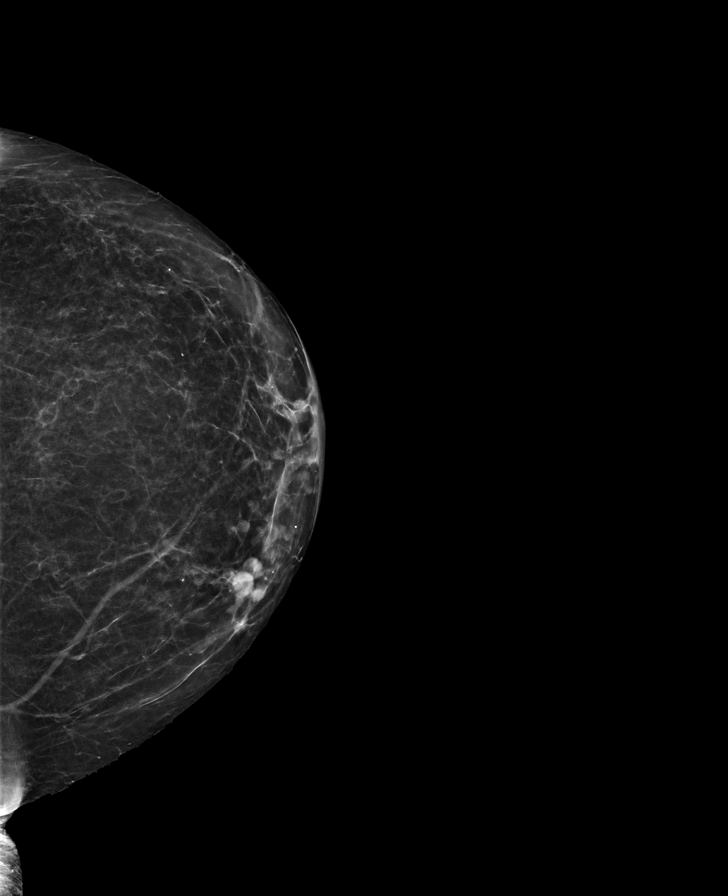

[R CC]
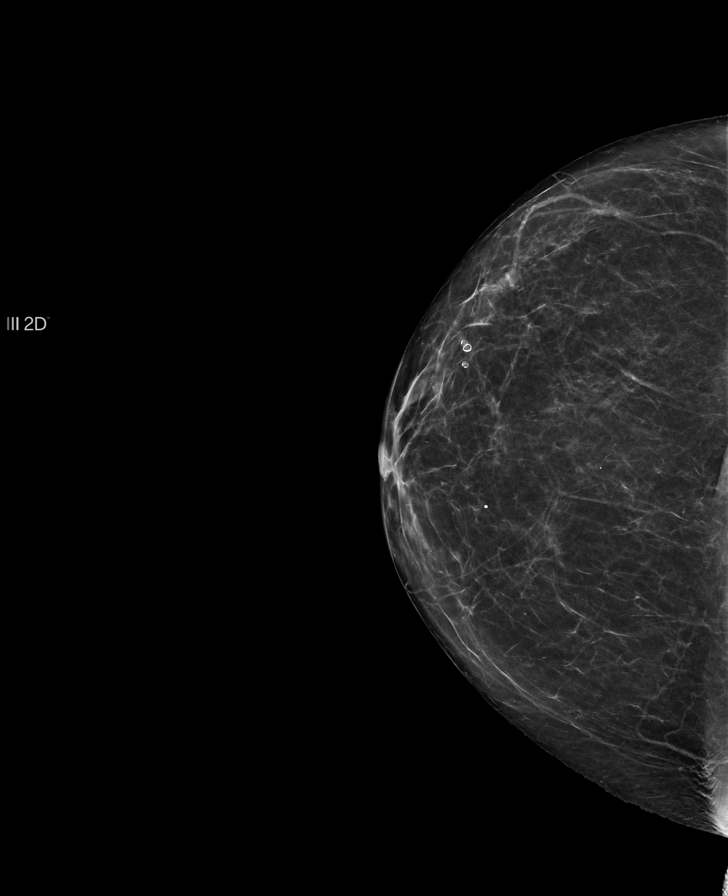

[L MLO tomo · tomo slice 30/59.0]
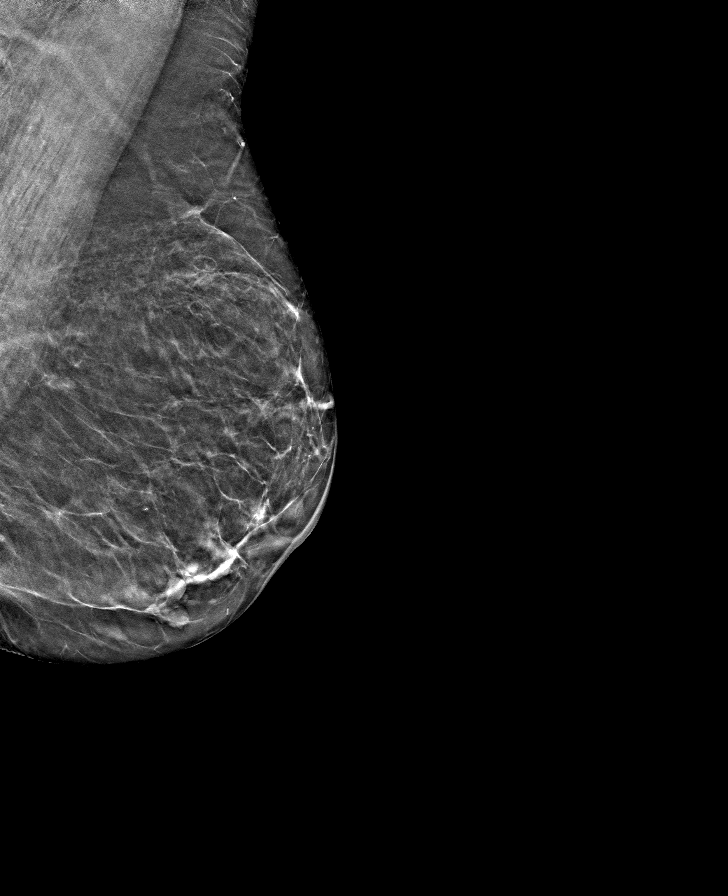

[R CC tomo · tomo slice 30/59.0]
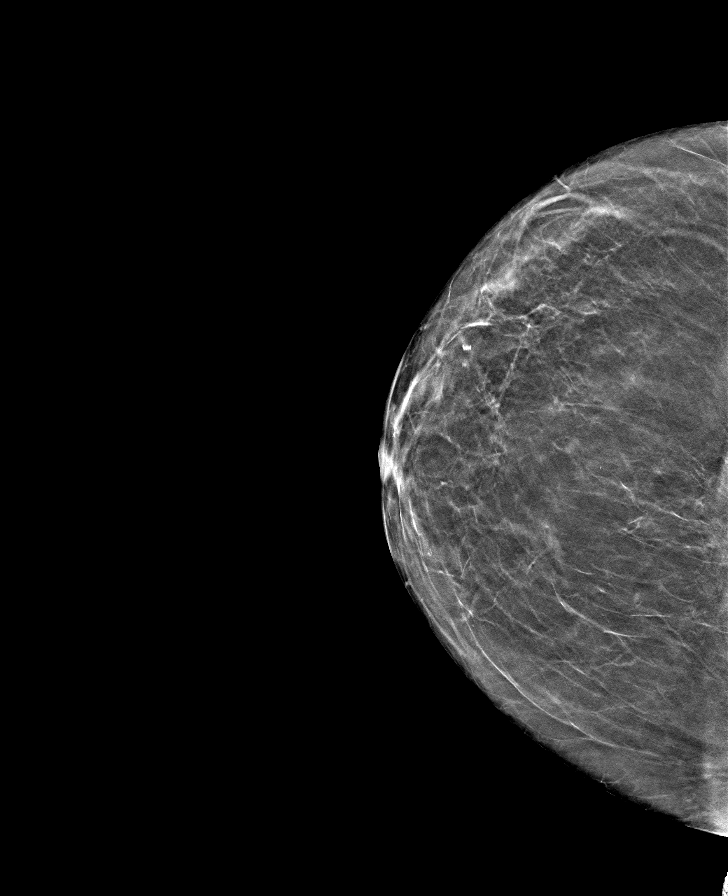

[R MLO tomo · tomo slice 35/70.0]
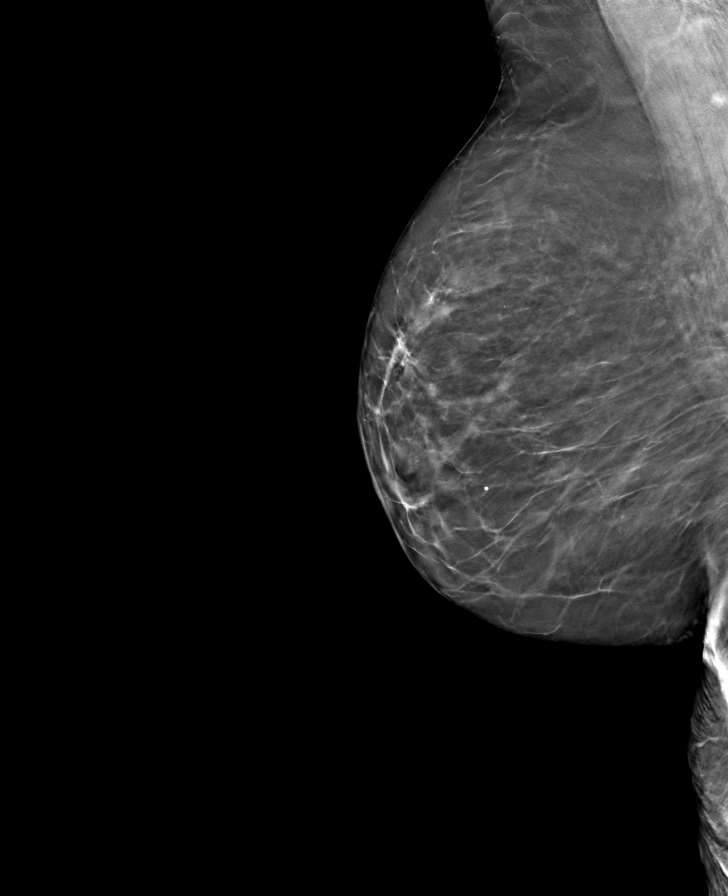

[L CC tomo · tomo slice 27/52.0]
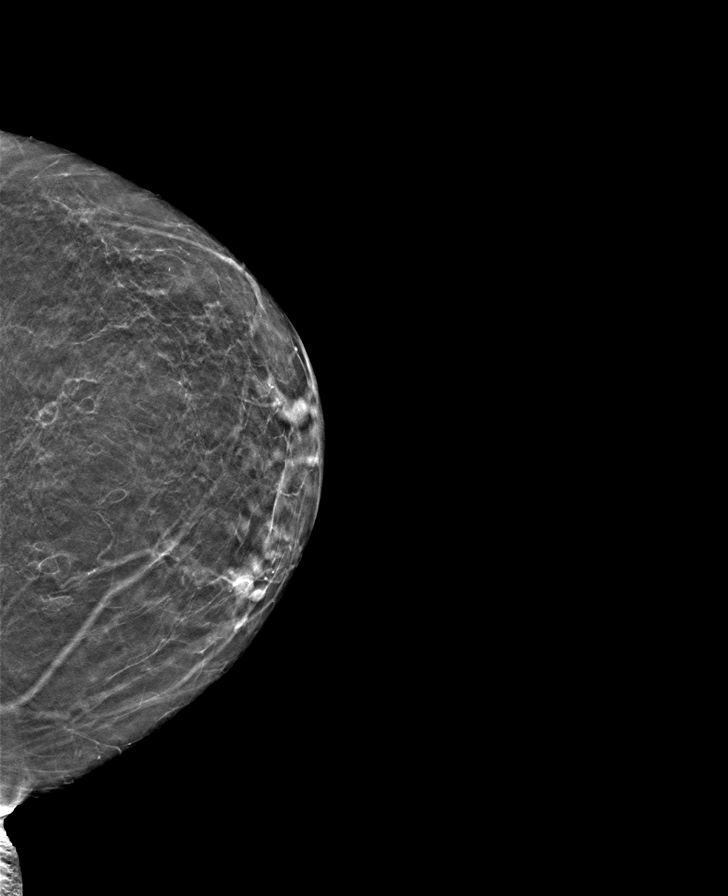

[8 of 24 positions shown; findings below may reference images not displayed]

FINDINGS: No suspicious mass, calcifications, or area of architectural 
distortion in either breast. Overall stable mammographic appearance.
IMPRESSION: No mammographic findings suggestive for malignancy. 
(BI-RADS 2) Benign findings. Routine mammographic follow-up is recommended.

## 2024-01-28 IMAGING — CT CT ENTEROGRAPHY WITH CONTRAST
3 of 4 series · 14 of 46 positions shown, 17 images · IV contrast (ISOVUE 300)
Comparison: Prior CT scans of the chest.

________________________________________________________________________________________________ 
CT ENTEROGRAPHY WITH CONTRAST, 01/28/2024 [DATE]: 
CLINICAL INDICATION: Crohns disease, unspecified, without complications. 
Diarrhea, unspecified 
A search for DICOM formatted images was conducted for prior CT imaging studies 
completed at a non-affiliated media free facility.
TECHNIQUE: The region of interest was scanned with contrast on a high 
resolution low dose CT scanner. 75 mL of Isovue 300 MDV were injected 
intravenously. The patient also drank approximately 0555 mL of oral contrast. 
Routine MPR reconstructions were performed. The patients eGFR was calculated to 
be 61.2 mL/min/1.73 m2 using the i-STAT device.

[Series 4: abd/pel with 3.0 i41s 2 · axial · 0.77mm/px · z∈[-444,-32]mm · 12 of 152 slices shown, 15 images]
[im 10/152  soft-tissue]
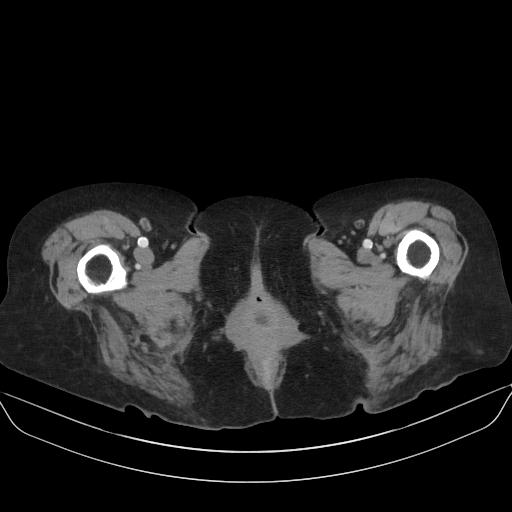
[im 10/152  bone]
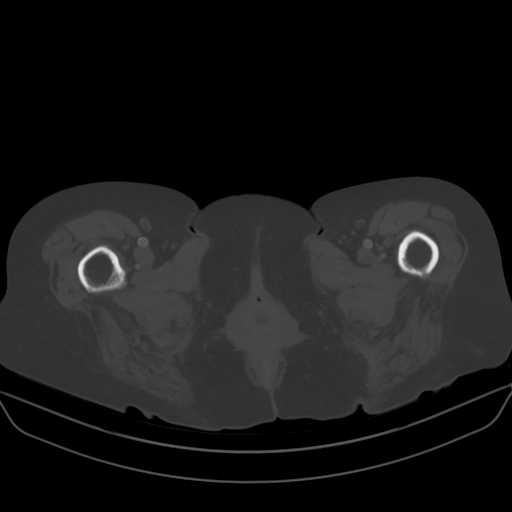
[im 30/152  soft-tissue]
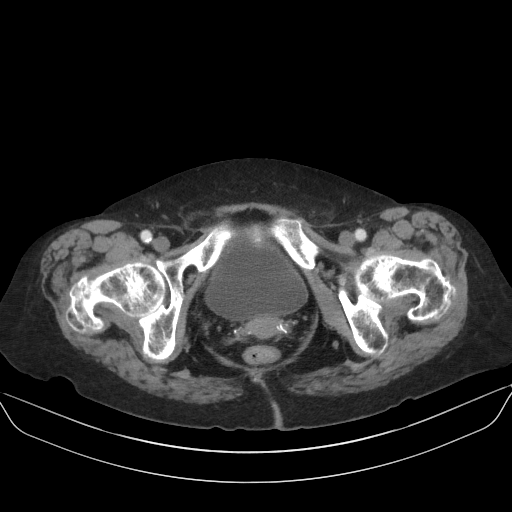
[im 44/152  soft-tissue]
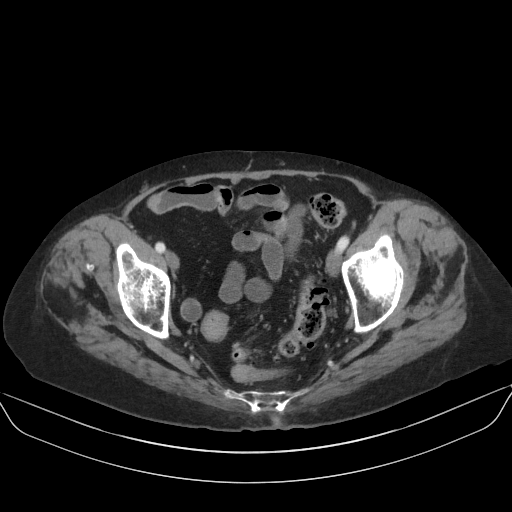
[im 59/152  soft-tissue]
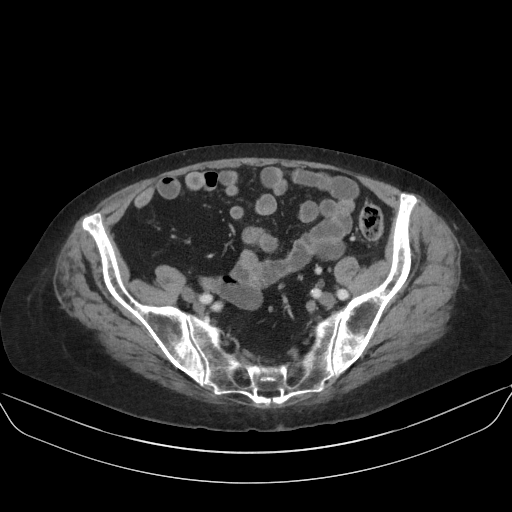
[im 78/152  soft-tissue]
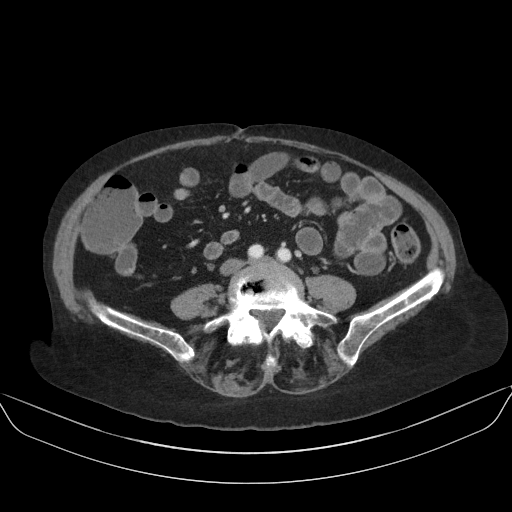
[im 93/152  soft-tissue]
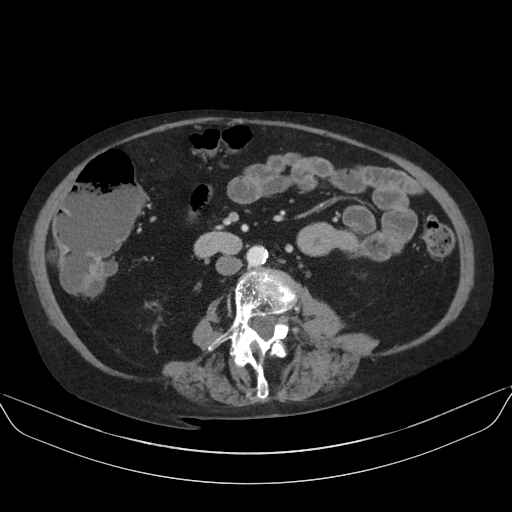
[im 108/152  soft-tissue]
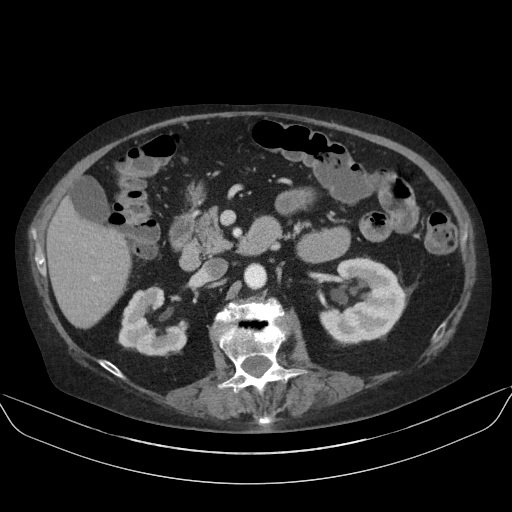
[im 127/152  soft-tissue]
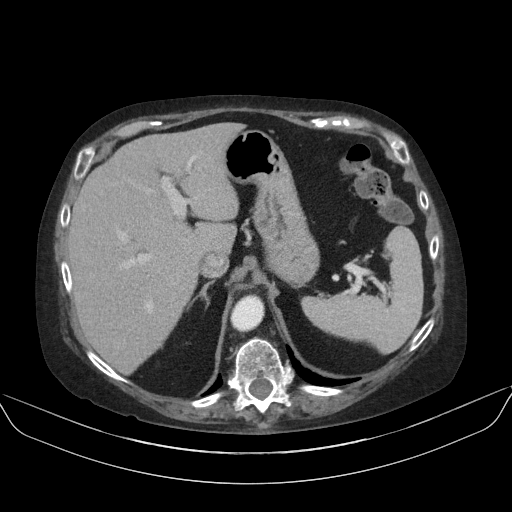
[im 132/152  lung]
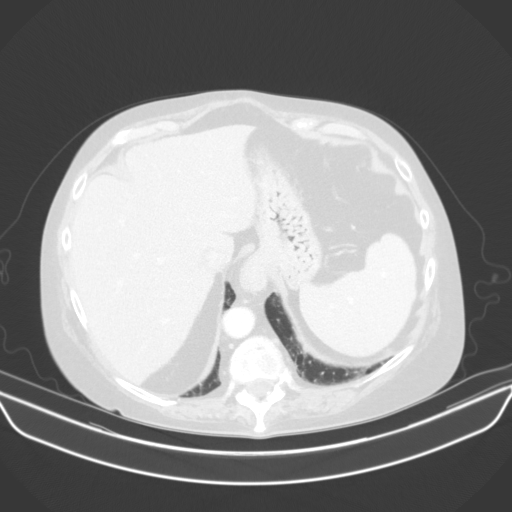
[im 137/152  lung]
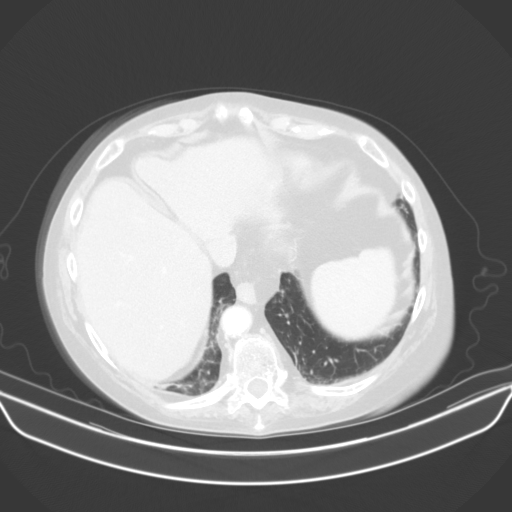
[im 142/152  soft-tissue]
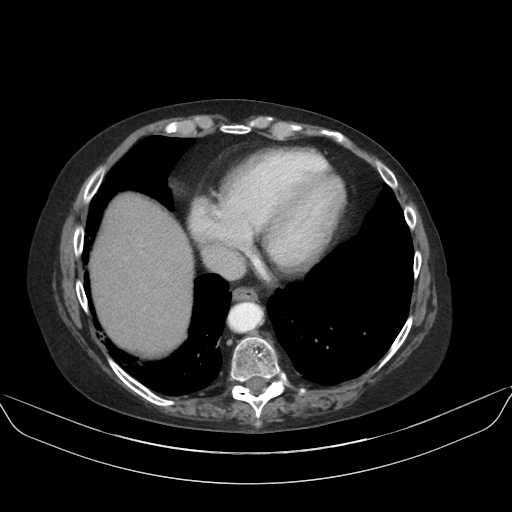
[im 142/152  lung]
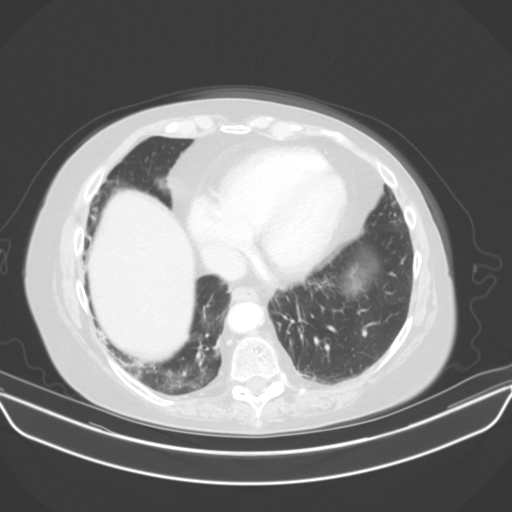
[im 142/152  bone]
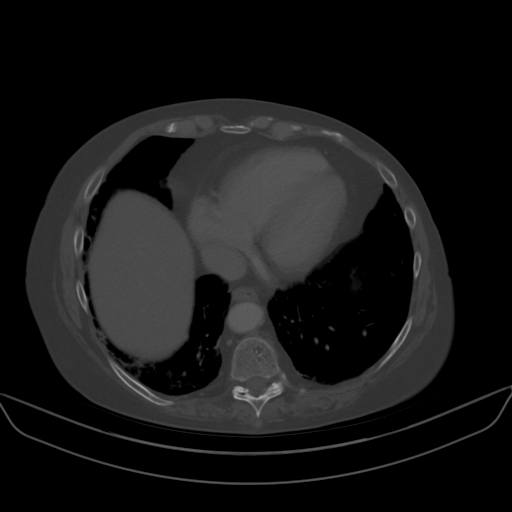
[im 147/152  lung]
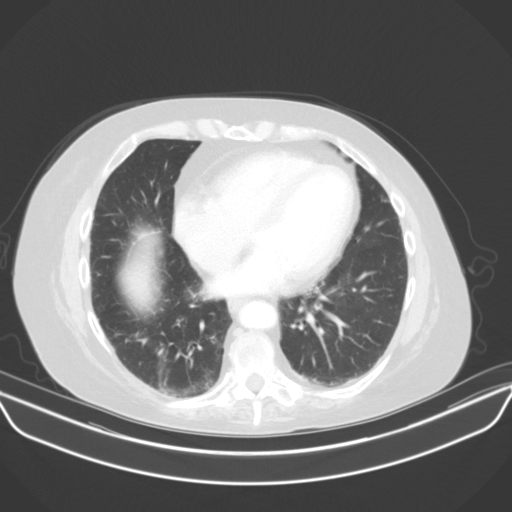

[Series 7: cor from thins · coronal · 0.77mm/px · 1 of 134 slices shown]
[im 67/134  soft-tissue]
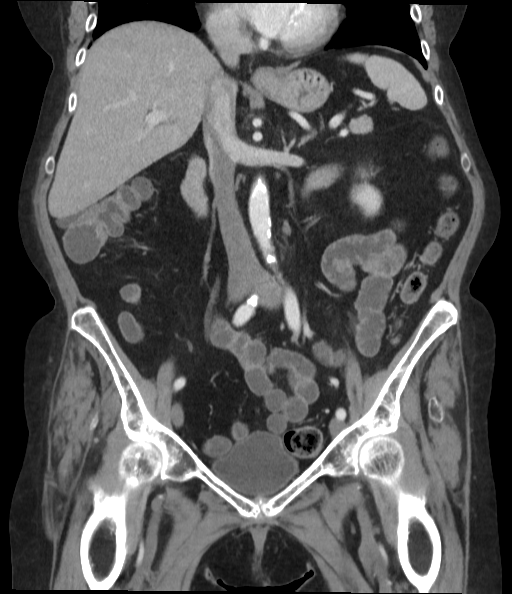

[Series 8: sag from thins · sagittal · 0.52mm/px · 1 of 180 slices shown]
[im 60/180  soft-tissue]
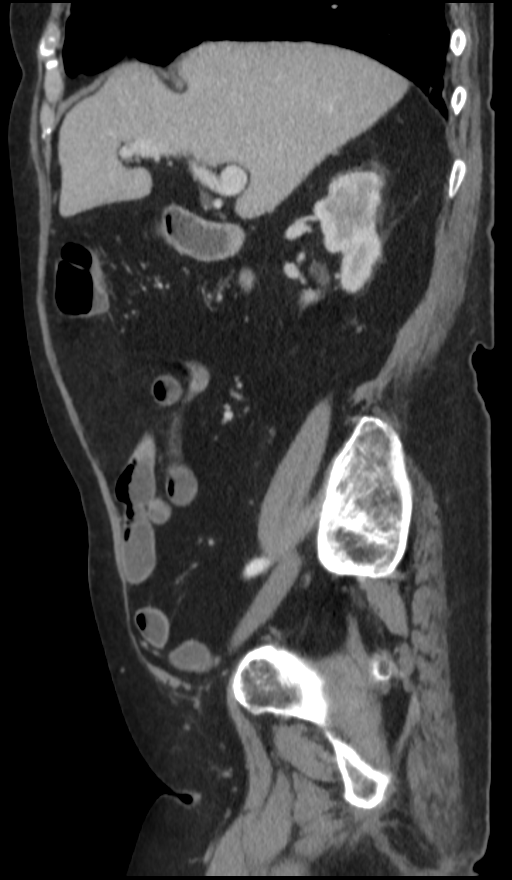

[14 of 46 positions shown; findings below may reference images not displayed]

Most recent February 2023.   
Count of known CT and Cardiac Nuclear Medicine studies performed in the previous 
12 months = 1.
FINDINGS: LUNG BASES: Mild bibasilar bronchiectatic changes. Atelectatic changes right 
lung base. 
HEPATOBILIARY: No mass or biliary dilatation. No gallstones. 
SPLEEN: Small cyst identified within the spleen. No mass identified. 
PANCREAS: No ductal dilatation or mass.   
ADRENALS: No mass. 
GENITOURINARY: No enhancing mass or hydronephrosis.  Bladder prolapse seen into 
the upper vagina. No secondary hydronephrosis seen. 
LYMPH NODES: No adenopathy. 
STOMACH, SMALL BOWEL AND COLON: No bowel wall thickening. Normal appearance of 
the terminal ileum. No evidence of obstruction. Stool seen within the distal 
colon. 
VASCULAR STRUCTURES: Atherosclerotic changes without aneurysmal dilatation.  
MUSCULOSKELETAL: Old T12 compression fracture treated with vertebroplasty. 
Similar to the prior exam. Degenerative changes.  
ADDITIONAL FINDINGS: Uterus and adnexa unremarkable in appearance.
IMPRESSION: Bladder prolapse. Chronic T12 fracture. Atherosclerotic changes. 
No acute abnormality seen within the bowel in this patient with stated history 
of Crohns disease. No bowel wall thickening or acute inflammatory changes seen. 
Bibasilar bronchiectatic changes and atelectatic changes. 
RADIATION DOSE REDUCTION: All CT scans are performed using radiation dose 
reduction techniques, when applicable.  Technical factors are evaluated and 
adjusted to ensure appropriate moderation of exposure.  Automated dose 
management technology is applied to adjust the radiation doses to minimize 
exposure while achieving diagnostic quality images.
